# Patient Record
Sex: Female | Born: 1980 | Race: White | Hispanic: No | Marital: Married | Smoking: Former smoker
Health system: Southern US, Community
[De-identification: ages and names within clinical notes are randomized; demographics above are authoritative.]

## PROBLEM LIST (undated history)

## (undated) ENCOUNTER — Inpatient Hospital Stay (HOSPITAL_COMMUNITY): Payer: Self-pay

## (undated) DIAGNOSIS — N390 Urinary tract infection, site not specified: Secondary | ICD-10-CM

## (undated) DIAGNOSIS — C189 Malignant neoplasm of colon, unspecified: Secondary | ICD-10-CM

## (undated) DIAGNOSIS — E039 Hypothyroidism, unspecified: Secondary | ICD-10-CM

## (undated) DIAGNOSIS — Z9889 Other specified postprocedural states: Secondary | ICD-10-CM

## (undated) DIAGNOSIS — O09299 Supervision of pregnancy with other poor reproductive or obstetric history, unspecified trimester: Secondary | ICD-10-CM

## (undated) DIAGNOSIS — Z87442 Personal history of urinary calculi: Secondary | ICD-10-CM

## (undated) DIAGNOSIS — N883 Incompetence of cervix uteri: Secondary | ICD-10-CM

## (undated) DIAGNOSIS — F419 Anxiety disorder, unspecified: Secondary | ICD-10-CM

## (undated) DIAGNOSIS — E079 Disorder of thyroid, unspecified: Secondary | ICD-10-CM

## (undated) DIAGNOSIS — R112 Nausea with vomiting, unspecified: Secondary | ICD-10-CM

## (undated) DIAGNOSIS — N200 Calculus of kidney: Secondary | ICD-10-CM

## (undated) DIAGNOSIS — J302 Other seasonal allergic rhinitis: Secondary | ICD-10-CM

## (undated) DIAGNOSIS — O09899 Supervision of other high risk pregnancies, unspecified trimester: Secondary | ICD-10-CM

## (undated) HISTORY — DX: Other specified postprocedural states: Z98.890

## (undated) HISTORY — DX: Supervision of other high risk pregnancies, unspecified trimester: O09.899

## (undated) HISTORY — DX: Disorder of thyroid, unspecified: E07.9

## (undated) HISTORY — PX: CHOLECYSTECTOMY: SHX55

## (undated) HISTORY — DX: Hypothyroidism, unspecified: E03.9

## (undated) HISTORY — DX: Malignant neoplasm of colon, unspecified: C18.9

## (undated) HISTORY — DX: Supervision of pregnancy with other poor reproductive or obstetric history, unspecified trimester: O09.299

## (undated) HISTORY — DX: Incompetence of cervix uteri: N88.3

---

## 2001-01-12 ENCOUNTER — Other Ambulatory Visit: Admission: RE | Admit: 2001-01-12 | Discharge: 2001-01-12 | Payer: Self-pay | Admitting: Specialist

## 2008-01-31 ENCOUNTER — Other Ambulatory Visit: Admission: RE | Admit: 2008-01-31 | Discharge: 2008-01-31 | Payer: Self-pay | Admitting: Obstetrics and Gynecology

## 2008-03-29 ENCOUNTER — Ambulatory Visit (HOSPITAL_COMMUNITY): Admission: RE | Admit: 2008-03-29 | Discharge: 2008-03-29 | Payer: Self-pay | Admitting: Specialist

## 2009-02-19 ENCOUNTER — Other Ambulatory Visit: Admission: RE | Admit: 2009-02-19 | Discharge: 2009-02-19 | Payer: Self-pay | Admitting: Obstetrics & Gynecology

## 2010-04-07 ENCOUNTER — Other Ambulatory Visit: Admission: RE | Admit: 2010-04-07 | Discharge: 2010-04-07 | Payer: Self-pay | Admitting: Obstetrics & Gynecology

## 2011-04-13 ENCOUNTER — Other Ambulatory Visit (HOSPITAL_COMMUNITY)
Admission: RE | Admit: 2011-04-13 | Discharge: 2011-04-13 | Disposition: A | Discharge status: Home / Self Care | Payer: BC Managed Care – PPO | Source: Ambulatory Visit | Attending: Obstetrics & Gynecology | Admitting: Obstetrics & Gynecology

## 2011-04-13 DIAGNOSIS — Z01419 Encounter for gynecological examination (general) (routine) without abnormal findings: Secondary | ICD-10-CM | POA: Insufficient documentation

## 2011-04-21 ENCOUNTER — Other Ambulatory Visit: Payer: Self-pay

## 2011-06-29 LAB — OB RESULTS CONSOLE ABO/RH: RH Type: POSITIVE

## 2011-06-29 LAB — OB RESULTS CONSOLE ANTIBODY SCREEN: Antibody Screen: NEGATIVE

## 2011-06-29 LAB — OB RESULTS CONSOLE HEPATITIS B SURFACE ANTIGEN: Hepatitis B Surface Ag: NEGATIVE

## 2011-06-29 LAB — OB RESULTS CONSOLE RUBELLA ANTIBODY, IGM: Rubella: IMMUNE

## 2011-09-30 ENCOUNTER — Encounter (HOSPITAL_COMMUNITY): Payer: Self-pay | Admitting: Anesthesiology

## 2011-09-30 ENCOUNTER — Encounter (HOSPITAL_COMMUNITY)
Admission: RE | Disposition: A | Discharge status: Home / Self Care | Payer: Self-pay | Source: Ambulatory Visit | Attending: Obstetrics and Gynecology

## 2011-09-30 ENCOUNTER — Inpatient Hospital Stay (HOSPITAL_COMMUNITY): Payer: BC Managed Care – PPO | Admitting: Anesthesiology

## 2011-09-30 ENCOUNTER — Encounter (HOSPITAL_COMMUNITY): Payer: Self-pay | Admitting: *Deleted

## 2011-09-30 ENCOUNTER — Other Ambulatory Visit: Payer: Self-pay | Admitting: Obstetrics and Gynecology

## 2011-09-30 ENCOUNTER — Ambulatory Visit (HOSPITAL_COMMUNITY)
Admission: RE | Admit: 2011-09-30 | Discharge: 2011-10-01 | Disposition: A | Discharge status: Home / Self Care | Payer: BC Managed Care – PPO | Source: Ambulatory Visit | Attending: Obstetrics and Gynecology | Admitting: Obstetrics and Gynecology

## 2011-09-30 DIAGNOSIS — O3430 Maternal care for cervical incompetence, unspecified trimester: Principal | ICD-10-CM | POA: Insufficient documentation

## 2011-09-30 DIAGNOSIS — O09293 Supervision of pregnancy with other poor reproductive or obstetric history, third trimester: Secondary | ICD-10-CM | POA: Diagnosis present

## 2011-09-30 DIAGNOSIS — Z01812 Encounter for preprocedural laboratory examination: Secondary | ICD-10-CM | POA: Insufficient documentation

## 2011-09-30 HISTORY — DX: Hypothyroidism, unspecified: E03.9

## 2011-09-30 HISTORY — PX: CERVICAL CERCLAGE: SHX1329

## 2011-09-30 HISTORY — DX: Other seasonal allergic rhinitis: J30.2

## 2011-09-30 LAB — CBC
HCT: 37.2 % (ref 36.0–46.0)
MCHC: 33.9 g/dL (ref 30.0–36.0)
MCV: 90.3 fL (ref 78.0–100.0)
Platelets: 284 10*3/uL (ref 150–400)
RDW: 13.6 % (ref 11.5–15.5)
WBC: 14.5 10*3/uL — ABNORMAL HIGH (ref 4.0–10.5)

## 2011-09-30 LAB — URINALYSIS, ROUTINE W REFLEX MICROSCOPIC
Ketones, ur: NEGATIVE mg/dL
Leukocytes, UA: NEGATIVE
Nitrite: NEGATIVE
Specific Gravity, Urine: 1.015 (ref 1.005–1.030)
Urobilinogen, UA: 0.2 mg/dL (ref 0.0–1.0)
pH: 6.5 (ref 5.0–8.0)

## 2011-09-30 LAB — BASIC METABOLIC PANEL
BUN: 7 mg/dL (ref 6–23)
Chloride: 103 mEq/L (ref 96–112)
Creatinine, Ser: 0.48 mg/dL — ABNORMAL LOW (ref 0.50–1.10)
GFR calc Af Amer: >90 mL/min (ref >90)
GFR calc non Af Amer: >90 mL/min (ref >90)

## 2011-09-30 LAB — SURGICAL PCR SCREEN: Staphylococcus aureus: NEGATIVE

## 2011-09-30 SURGERY — CERCLAGE, CERVIX, VAGINAL APPROACH
Anesthesia: Spinal | Site: Cervix | Wound class: Clean Contaminated

## 2011-09-30 MED ORDER — OXYCODONE-ACETAMINOPHEN 5-325 MG PO TABS
1.0000 | ORAL_TABLET | ORAL | Status: DC | PRN
  Administered 2011-09-30: 1 via ORAL
  Filled 2011-09-30: qty 1

## 2011-09-30 MED ORDER — DEXTROSE 5 % IV SOLN
500.0000 mg | INTRAVENOUS | Status: DC
  Administered 2011-10-01: 500 mg via INTRAVENOUS
  Filled 2011-09-30: qty 500

## 2011-09-30 MED ORDER — BUTORPHANOL TARTRATE 1 MG/ML IJ SOLN: 1.0000 mg | INTRAMUSCULAR | Status: DC | PRN

## 2011-09-30 MED ORDER — FENTANYL CITRATE 0.05 MG/ML IJ SOLN
INTRAMUSCULAR | Status: DC | PRN
  Administered 2011-09-30: 25 ug via INTRATHECAL

## 2011-09-30 MED ORDER — MUPIROCIN 2 % EX OINT
TOPICAL_OINTMENT | Freq: Two times a day (BID) | CUTANEOUS | Status: DC
  Administered 2011-09-30: 1 via NASAL

## 2011-09-30 MED ORDER — CEFAZOLIN SODIUM 1-5 GM-% IV SOLN
INTRAVENOUS | Status: AC
  Filled 2011-09-30: qty 50

## 2011-09-30 MED ORDER — DOCUSATE SODIUM 100 MG PO CAPS
100.0000 mg | ORAL_CAPSULE | Freq: Two times a day (BID) | ORAL | Status: DC
  Administered 2011-09-30: 100 mg via ORAL
  Filled 2011-09-30 (×2): qty 1

## 2011-09-30 MED ORDER — 0.9 % SODIUM CHLORIDE (POUR BTL) OPTIME
TOPICAL | Status: DC | PRN
  Administered 2011-09-30: 1000 mL

## 2011-09-30 MED ORDER — FENTANYL CITRATE 0.05 MG/ML IJ SOLN: 25.0000 ug | INTRAMUSCULAR | Status: DC | PRN

## 2011-09-30 MED ORDER — MUPIROCIN 2 % EX OINT
TOPICAL_OINTMENT | CUTANEOUS | Status: AC
  Administered 2011-09-30: 1 via NASAL
  Filled 2011-09-30: qty 22

## 2011-09-30 MED ORDER — LIDOCAINE IN DEXTROSE 5-7.5 % IV SOLN
INTRAVENOUS | Status: AC
  Filled 2011-09-30: qty 2

## 2011-09-30 MED ORDER — FENTANYL CITRATE 0.05 MG/ML IJ SOLN
INTRAMUSCULAR | Status: AC
  Filled 2011-09-30: qty 2

## 2011-09-30 MED ORDER — ONDANSETRON HCL 4 MG/2ML IJ SOLN: 4.0000 mg | Freq: Once | INTRAMUSCULAR | Status: DC | PRN

## 2011-09-30 MED ORDER — TERBUTALINE SULFATE 1 MG/ML IJ SOLN
INTRAMUSCULAR | Status: DC | PRN
  Administered 2011-09-30: 250 ug via SUBCUTANEOUS

## 2011-09-30 MED ORDER — MIDAZOLAM HCL 2 MG/2ML IJ SOLN
1.0000 mg | INTRAMUSCULAR | Status: DC | PRN
  Administered 2011-09-30 (×2): 1 mg via INTRAVENOUS

## 2011-09-30 MED ORDER — ONDANSETRON HCL 4 MG/2ML IJ SOLN
INTRAMUSCULAR | Status: AC
  Filled 2011-09-30: qty 2

## 2011-09-30 MED ORDER — ZOLPIDEM TARTRATE 5 MG PO TABS: 5.0000 mg | ORAL_TABLET | Freq: Every evening | ORAL | Status: DC | PRN

## 2011-09-30 MED ORDER — NIFEDIPINE 10 MG PO CAPS
10.0000 mg | ORAL_CAPSULE | Freq: Three times a day (TID) | ORAL | Status: DC
  Administered 2011-09-30 (×2): 10 mg via ORAL
  Filled 2011-09-30 (×7): qty 1

## 2011-09-30 MED ORDER — ACETAMINOPHEN 325 MG PO TABS: 650.0000 mg | ORAL_TABLET | ORAL | Status: DC | PRN

## 2011-09-30 MED ORDER — LACTATED RINGERS IV SOLN
INTRAVENOUS | Status: DC
  Administered 2011-09-30: 1000 mL via INTRAVENOUS

## 2011-09-30 MED ORDER — SODIUM CHLORIDE 0.9 % IV SOLN
INTRAVENOUS | Status: AC
  Administered 2011-09-30: 16:00:00 via INTRAVENOUS

## 2011-09-30 MED ORDER — EPHEDRINE SULFATE 50 MG/ML IJ SOLN
INTRAMUSCULAR | Status: DC | PRN
  Administered 2011-09-30: 10 mg via INTRAVENOUS

## 2011-09-30 MED ORDER — PRENATAL MULTIVITAMIN CH
1.0000 | ORAL_TABLET | Freq: Every day | ORAL | Status: DC
  Administered 2011-09-30: 1 via ORAL
  Filled 2011-09-30 (×3): qty 1

## 2011-09-30 MED ORDER — LIDOCAINE IN DEXTROSE 5-7.5 % IV SOLN
INTRAVENOUS | Status: DC | PRN
  Administered 2011-09-30: 70 mg via INTRATHECAL

## 2011-09-30 MED ORDER — ONDANSETRON HCL 4 MG PO TABS: 4.0000 mg | ORAL_TABLET | Freq: Four times a day (QID) | ORAL | Status: DC | PRN

## 2011-09-30 MED ORDER — DEXTROSE 5 % IV SOLN: 1.0000 g | INTRAVENOUS | Status: DC

## 2011-09-30 MED ORDER — TERBUTALINE SULFATE 1 MG/ML IJ SOLN
INTRAMUSCULAR | Status: AC
  Filled 2011-09-30: qty 1

## 2011-09-30 MED ORDER — MIDAZOLAM HCL 2 MG/2ML IJ SOLN
INTRAMUSCULAR | Status: AC
  Administered 2011-09-30: 1 mg via INTRAVENOUS
  Filled 2011-09-30: qty 2

## 2011-09-30 MED ORDER — LEVOTHYROXINE SODIUM 50 MCG PO TABS
50.0000 ug | ORAL_TABLET | Freq: Every day | ORAL | Status: DC
  Administered 2011-10-01: 50 ug via ORAL
  Filled 2011-09-30 (×2): qty 1

## 2011-09-30 MED ORDER — ONDANSETRON HCL 4 MG/2ML IJ SOLN: 4.0000 mg | Freq: Four times a day (QID) | INTRAMUSCULAR | Status: DC | PRN

## 2011-09-30 MED ORDER — SIMETHICONE 80 MG PO CHEW: 80.0000 mg | CHEWABLE_TABLET | Freq: Four times a day (QID) | ORAL | Status: DC | PRN

## 2011-09-30 SURGICAL SUPPLY — 19 items
BAG HAMPER (MISCELLANEOUS) ×2 IMPLANT
CATH ROBINSON RED A/P 16FR (CATHETERS) IMPLANT
CLOTH BEACON ORANGE TIMEOUT ST (SAFETY) ×2 IMPLANT
COVER SURGICAL LIGHT HANDLE (MISCELLANEOUS) ×4 IMPLANT
DRAPE PROXIMA HALF (DRAPES) ×2 IMPLANT
GAUZE SPONGE 4X4 16PLY XRAY LF (GAUZE/BANDAGES/DRESSINGS) ×1 IMPLANT
GLOVE ECLIPSE 6.5 STRL STRAW (GLOVE) ×3 IMPLANT
GLOVE ECLIPSE 9.0 STRL (GLOVE) ×3 IMPLANT
GLOVE INDICATOR 6.5 STRL GRN (GLOVE) ×1 IMPLANT
GLOVE INDICATOR 7.0 STRL GRN (GLOVE) ×3 IMPLANT
GLOVE INDICATOR STER SZ 9 (GLOVE) ×2 IMPLANT
GOWN STRL REIN 3XL LVL4 (GOWN DISPOSABLE) ×2 IMPLANT
GOWN STRL REIN XL XLG (GOWN DISPOSABLE) ×3 IMPLANT
KIT ROOM TURNOVER AP CYSTO (KITS) ×2 IMPLANT
MANIFOLD NEPTUNE II (INSTRUMENTS) ×2 IMPLANT
NS IRRIG 1000ML POUR BTL (IV SOLUTION) ×2 IMPLANT
PACK PERI GYN (CUSTOM PROCEDURE TRAY) ×2 IMPLANT
PAD ARMBOARD 7.5X6 YLW CONV (MISCELLANEOUS) ×2 IMPLANT
SUT PROLENE 0 CT 1 30 (SUTURE) ×4 IMPLANT

## 2011-09-30 NOTE — Anesthesia Preprocedure Evaluation (Addendum)
Anesthesia Evaluation  Patient identified by MRN, date of birth, ID band Patient awake    Reviewed: Allergy & Precautions, H&P , NPO status , Patient's Chart, lab work & pertinent test results  History of Anesthesia Complications Negative for: history of anesthetic complications  Airway Mallampati: I TM Distance: >3 FB     Dental  (+) Teeth Intact   Pulmonary neg pulmonary ROS, Current Smoker,  breath sounds clear to auscultation        Cardiovascular negative cardio ROS  Rhythm:Regular     Neuro/Psych    GI/Hepatic   Endo/Other  Hypothyroidism   Renal/GU      Musculoskeletal   Abdominal   Peds  Hematology   Anesthesia Other Findings   Reproductive/Obstetrics                           Anesthesia Physical Anesthesia Plan  ASA: II  Anesthesia Plan: Spinal   Post-op Pain Management:    Induction:   Airway Management Planned: Nasal Cannula  Additional Equipment:   Intra-op Plan:   Post-operative Plan:   Informed Consent: I have reviewed the patients History and Physical, chart, labs and discussed the procedure including the risks, benefits and alternatives for the proposed anesthesia with the patient or authorized representative who has indicated his/her understanding and acceptance.     Plan Discussed with:   Anesthesia Plan Comments:         Anesthesia Quick Evaluation

## 2011-09-30 NOTE — Anesthesia Postprocedure Evaluation (Signed)
  Anesthesia Post-op Note  Patient: Kimberly Morgan  Procedure(s) Performed: Procedure(s) (LRB): CERCLAGE CERVICAL (N/A)  Patient Location: PACU  Anesthesia Type: Spinal  Level of Consciousness: awake, alert , oriented and patient cooperative  Airway and Oxygen Therapy: Patient Spontanous Breathing  Post-op Pain: none  Post-op Assessment: Post-op Vital signs reviewed, Patient's Cardiovascular Status Stable, Respiratory Function Stable, Patent Airway, No signs of Nausea or vomiting and Pain level controlled  Post-op Vital Signs: Reviewed and stable  Complications: No apparent anesthesia complications

## 2011-09-30 NOTE — Brief Op Note (Signed)
09/30/2011  2:03 PM  PATIENT:  Kimberly Morgan  31 y.o. female  PRE-OPERATIVE DIAGNOSIS:  Cervical incompetency, pregnancy 19+4 weeks  POST-OPERATIVE DIAGNOSIS:  Cervical incompetency, pregnancy 19+4 weeks  PROCEDURE:  Procedure(s) (LRB): CERCLAGE CERVICAL (N/A)  SURGEON:  Surgeon(s) and Role:    * [name], MD - Primary  PHYSICIAN ASSISTANT:   ASSISTANTS: Orson Aloe CST-FA   ANESTHESIA:   spinal  EBL:  Total I/O In: 700 [I.V.:700] Out: -   BLOOD ADMINISTERED:none  DRAINS: none   LOCAL MEDICATIONS USED:  NONE  SPECIMEN:  No Specimen  DISPOSITION OF SPECIMEN:  N/A  COUNTS:  YES  TOURNIQUET:  * No tourniquets in log *  DICTATION: .Dragon Dictation Patient was taken to the operating room spinal anesthesia introduced and patient placed in high lithotomy position. Timeout was conducted and perineal prepping performed. Ancef and an administered preoperatively. Bivalve speculum was placed in the vagina allowing visibility of the cervix and the membranes which were visible just inside the external os. Tocolyse is administered so when the patient was placed in the Trendelenburg position the membranes receded to allow easier surgical technique. Ring forceps were used to grasp the cervix along its apprehension approximately 2 cm from the tip of the cervix while under traction, we were able to place a circumferential 0 Prolene pursestring suture just at the cervicovaginal junction. Upon completion of circumferential place a knot was placed time pulling the cerclage sufficiently snug ring forceps met resistance at the level of the ring.KNot was tied down with 5 knots in the ligature.. A second circumferential 0 Prolene suture was placed just above the first and similarly secured there was no significant bleeding and no membrane rupture or or suggestion of fluid leakage. Patient was allowed to go recovery in stable condition sponge and needle counts correct. PLAN OF CARE: Admit  for overnight observation.  PATIENT DISPOSITION:  PACU - hemodynamically stable.   Delay start of Pharmacological VTE agent (>24hrs) due  surgical blood loss or risk of bleeding: not applicable

## 2011-09-30 NOTE — Op Note (Signed)
See details of operative procedure dictated in brief operative note

## 2011-09-30 NOTE — Transfer of Care (Signed)
Immediate Anesthesia Transfer of Care Note  Patient: Kimberly Morgan  Procedure(s) Performed: Procedure(s) (LRB): CERCLAGE CERVICAL (N/A)  Patient Location: PACU  Anesthesia Type: Spinal  Level of Consciousness: awake, alert , oriented and patient cooperative  Airway & Oxygen Therapy: Patient Spontanous Breathing and Patient connected to face mask oxygen  Post-op Assessment: Report given to PACU RN and Post -op Vital signs reviewed and stable  Post vital signs: Reviewed and stable  Complications: No apparent anesthesia complications

## 2011-09-30 NOTE — Progress Notes (Signed)
Fetal heart rate 147

## 2011-09-30 NOTE — Anesthesia Procedure Notes (Addendum)
Spinal  Patient location during procedure: OR Start time: 09/30/2011 1:12 PM Staffing CRNA/Resident: Rozetta Stumpp J Performed by: resident/CRNA  Preanesthetic Checklist Completed: patient identified, site marked, surgical consent, pre-op evaluation, timeout performed, IV checked, risks and benefits discussed and monitors and equipment checked Spinal Block Patient position: sitting Prep: Betadine Patient monitoring: heart rate, cardiac monitor, continuous pulse ox and blood pressure Approach: midline Location: L3-4 Injection technique: single-shot Needle Needle type: Sprotte  Needle gauge: 24 G Assessment Sensory level: L1  [id number]    10/2012

## 2011-09-30 NOTE — H&P (Signed)
Kimberly Morgan is an 31 y.o. female. She is admitted for rescue McDonald cerclage for cervical incompetence.Marland KitchenHe was seen in the office today due to complaints of vaginal discharge that was considered excessive and would like pain discoloration 1-1/2 days. She had some cramping yesterday but denies any cramping today. She's had a gush of fluid last sexual activity was 2 days ago without any post coital bleeding or or unusual symptom. Speculum exam shows cervix dilated to 2-3 cm with bag waters just inside the external os. The patient's been counseled over treatment options with recommendation for  rescue cerclage, with pros and cons discussed with patient. Patient understands this is significantly high-risk circumstance due to the advanced cervical dilation at this time and she accepts the recommended McDonald cerclage. Past medical history positive for hypothyroidism with patient recently restarted on her Synthroid 50 mcg each day. TSH is normal is 21.8. Surgical history laparoscopic cholecystectomy allergies to aspirin and Bactrim education Synthroid 50 mcg daily prenatal vitamins social history married works as hairdresser history positive that the father the baby had non-Hodgkin's lymphoma as a child and underwent chemotherapy and radiation. This pregnancy was a Clomid-induced ovulation. 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 There is also a history of lung cancer  Pertinent Gynecological History: Menses: LMP 05/15/2011 Bleeding: Not applicable  Contraception: Pregnant DES exposure: unknown Blood transfusions: none Sexually transmitted diseases: no past history Previous GYN Procedures: Last mammogram: Blood Date: \ Prior GYN procedures: At age 11 she had a biopsy or other procedure for abnormal Pap smear followed by cryocautery. OB History: G1, P0   Menstrual History: Menarche age: 40 No LMP  recorded.    No past medical history on file.  No past surgical history on file.  No family history on file.  Social History:  does not have a smoking history on file. She does not have any smokeless tobacco history on file. Her alcohol and drug histories not on file.  Allergies: Allergies not on file   (Not in a hospital admission)  Review of Systems  Constitutional: Negative.  Negative for fever and chills.  HENT: Negative.   Gastrointestinal: Negative.   Genitourinary: Negative.  Negative for dysuria and urgency.       Last sex 2 days ago, no bleeding or gush of fluid. Noted heavier than expected discharge x 1 1/2 days,  Lite pink .  No cramping today but some yesterday.  Skin: Negative.     There were no vitals taken for this visit. Physical Exam  Constitutional: She is oriented to person, place, and time. She appears well-developed and well-nourished.  HENT:  Head: Normocephalic.  Eyes: Pupils are equal, round, and reactive to light.  Cardiovascular: Regular rhythm.   Respiratory: Effort normal and breath sounds normal.  GI: Soft. There is no tenderness.  Genitourinary: Vaginal discharge found.       Ultrasound shows funneling thru cervix to ext os. No functional cervical length is closed, but int os identifiable with funnel over 3 cm length.No mucus or debris in bow. Breech presentation normal fetal survey.  Musculoskeletal: Normal range of motion.  Neurological: She is oriented to person, place, and time.  Psychiatric: She has a normal mood and affect. Her behavior is normal. Thought content normal.    No results found for this or any previous visit (from the past 24 hour(s)).  No results found.  Assessment/Plan: Cervical incompetency 19 weeks  4 days at for McDonald cerclage placement. Risk of procedure including membrane rupture, or failure to prevent progression for preterm delivery discussed with patient who understands risks  Kimberly Morgan 09/30/2011,  11:06 AM

## 2011-10-01 LAB — CBC
Platelets: 240 10*3/uL (ref 150–400)
RDW: 14 % (ref 11.5–15.5)
WBC: 14.4 10*3/uL — ABNORMAL HIGH (ref 4.0–10.5)

## 2011-10-01 MED ORDER — DEXTROSE 5 % IV SOLN
INTRAVENOUS | Status: AC
  Filled 2011-10-01: qty 500

## 2011-10-01 MED ORDER — PROGESTERONE MICRONIZED 200 MG PO CAPS: 200.0000 mg | ORAL_CAPSULE | Freq: Every day | ORAL | Status: DC

## 2011-10-01 MED ORDER — AZITHROMYCIN 500 MG PO TABS: 500.0000 mg | ORAL_TABLET | Freq: Once | ORAL | Status: AC

## 2011-10-01 MED ORDER — DSS 100 MG PO CAPS: 100.0000 mg | ORAL_CAPSULE | Freq: Two times a day (BID) | ORAL | Status: AC

## 2011-10-01 NOTE — Discharge Summary (Signed)
Physician Discharge Summary  Patient ID: Kimberly Morgan MRN: [id number] DOB/AGE: 08/10/1980 31 y.o.  Admit date: 09/30/2011 Discharge date: 10/01/2011  Admission Diagnoses:cervical incompetency, pregnancy 19+4 wks   Discharge Diagnoses: same Principal Problem:  *Incompetent cervix in pregnancy   Discharged Condition: good  Hospital Course: Patient admitted, thru day surgery, for rescue cerclage, Mcdonald, using 0 prolene x 2 sutures.  Observed overnite, with minimal cramping, afebrile, with discharge to home care for followup 4 days [facility]  Consults: None  Significant Diagnostic Studies: labs: Lab Results  Component Value Date   WBC 14.4* 10/01/2011   HGB 10.5* 10/01/2011   HCT 31.3* 10/01/2011   MCV 91.8 10/01/2011   PLT 240 10/01/2011    Treatments: surgery: Mcdonald Cerclage, Antibiotics x 4 days,   Discharge Exam: Blood pressure 91/53, pulse 72, temperature 98.4 F (36.9 C), temperature source Oral, resp. rate 18, height 5\' 3"  (1.6 m), weight 88.451 kg (195 lb), last menstrual period 05/15/2011, SpO2 97.00%. General appearance: alert, cooperative and no distress GI: soft, non-tender; bowel sounds normal; no masses,  no organomegaly Pelvic: no bleeding or cramping this morning  Disposition: Final discharge disposition not confirmed Discharge to home care  Medication List  As of 10/01/2011  9:26 AM   TAKE these medications         azithromycin 500 MG tablet   Commonly known as: ZITHROMAX   Take 1 tablet (500 mg total) by mouth once.      DSS 100 MG Caps   Take 100 mg by mouth 2 (two) times daily.      levothyroxine 50 MCG tablet   Commonly known as: SYNTHROID, LEVOTHROID   Take 50 mcg by mouth daily.      prenatal multivitamin Tabs   Take 1 tablet by mouth daily.      progesterone 200 MG capsule   Commonly known as: PROMETRIUM   Take 1 capsule (200 mg total) by mouth daily.           Follow-up Information    Follow up with Lazaro Arms, MD. (or go to  [facility] hospital prn as needed if symptoms worsen)    Contact information:   [facility] [address] [place] [address] [phone]          Signed: [name] 10/01/2011, 9:26 AM

## 2011-10-01 NOTE — Progress Notes (Signed)
Pt denies c/o's no bleeding noted with ambulation FHR between 150-155

## 2011-10-01 NOTE — Anesthesia Postprocedure Evaluation (Signed)
Anesthesia Post Note  Patient: Kimberly Morgan  Procedure(s) Performed: Procedure(s) (LRB): CERCLAGE CERVICAL (N/A)  Anesthesia type: Spinal  Patient location:324  Post pain: Pain level controlled  Post assessment: Post-op Vital signs reviewed, Patient's Cardiovascular Status Stable, Respiratory Function Stable, Patent Airway, No signs of Nausea or vomiting and Pain level controlled  Last Vitals:  Filed Vitals:   10/01/11 0519  BP: 91/53  Pulse: 72  Temp: 36.9 C  Resp: 18    Post vital signs: Reviewed and stable  Level of consciousness: awake and alert   Complications: No apparent anesthesia complications

## 2011-10-01 NOTE — Addendum Note (Signed)
Addendum  created 10/01/11 0800 by Franco Nones, CRNA   Modules edited:Notes Section

## 2011-10-05 ENCOUNTER — Encounter (HOSPITAL_COMMUNITY): Payer: Self-pay | Admitting: Obstetrics and Gynecology

## 2011-10-20 ENCOUNTER — Other Ambulatory Visit: Payer: Self-pay | Admitting: Obstetrics and Gynecology

## 2011-10-20 ENCOUNTER — Encounter (HOSPITAL_COMMUNITY): Payer: Self-pay | Admitting: *Deleted

## 2011-10-20 ENCOUNTER — Inpatient Hospital Stay (HOSPITAL_COMMUNITY)
Admission: AD | Admit: 2011-10-20 | Discharge: 2011-10-22 | DRG: 372 | Disposition: A | Discharge status: Home / Self Care | Payer: BC Managed Care – PPO | Source: Ambulatory Visit | Attending: Obstetrics and Gynecology | Admitting: Obstetrics and Gynecology

## 2011-10-20 DIAGNOSIS — O3430 Maternal care for cervical incompetence, unspecified trimester: Principal | ICD-10-CM | POA: Diagnosis present

## 2011-10-20 DIAGNOSIS — E039 Hypothyroidism, unspecified: Secondary | ICD-10-CM | POA: Diagnosis present

## 2011-10-20 DIAGNOSIS — O411090 Infection of amniotic sac and membranes, unspecified, unspecified trimester, not applicable or unspecified: Secondary | ICD-10-CM | POA: Diagnosis present

## 2011-10-20 DIAGNOSIS — E079 Disorder of thyroid, unspecified: Secondary | ICD-10-CM | POA: Diagnosis present

## 2011-10-20 DIAGNOSIS — O4290 Premature rupture of membranes, unspecified as to length of time between rupture and onset of labor, unspecified weeks of gestation: Secondary | ICD-10-CM | POA: Diagnosis present

## 2011-10-20 DIAGNOSIS — O99284 Endocrine, nutritional and metabolic diseases complicating childbirth: Secondary | ICD-10-CM | POA: Diagnosis present

## 2011-10-20 LAB — URINALYSIS, ROUTINE W REFLEX MICROSCOPIC
Ketones, ur: 15 mg/dL — AB
Nitrite: NEGATIVE
Protein, ur: NEGATIVE mg/dL
Urobilinogen, UA: 0.2 mg/dL (ref 0.0–1.0)
pH: 5.5 (ref 5.0–8.0)

## 2011-10-20 LAB — URINE MICROSCOPIC-ADD ON

## 2011-10-20 LAB — CBC
Hemoglobin: 11.6 g/dL — ABNORMAL LOW (ref 12.0–15.0)
MCHC: 33.6 g/dL (ref 30.0–36.0)
Platelets: 244 10*3/uL (ref 150–400)
RDW: 13.3 % (ref 11.5–15.5)

## 2011-10-20 MED ORDER — ACETAMINOPHEN 325 MG PO TABS
650.0000 mg | ORAL_TABLET | ORAL | Status: DC | PRN
  Administered 2011-10-21: 650 mg via ORAL
  Filled 2011-10-20: qty 2

## 2011-10-20 MED ORDER — LEVOTHYROXINE SODIUM 50 MCG PO TABS
50.0000 ug | ORAL_TABLET | Freq: Every day | ORAL | Status: DC
  Administered 2011-10-21: 50 ug via ORAL
  Filled 2011-10-20 (×2): qty 1

## 2011-10-20 MED ORDER — ZOLPIDEM TARTRATE 10 MG PO TABS
10.0000 mg | ORAL_TABLET | Freq: Every evening | ORAL | Status: DC | PRN
  Administered 2011-10-21: 10 mg via ORAL
  Filled 2011-10-20: qty 1

## 2011-10-20 MED ORDER — PRENATAL MULTIVITAMIN CH
1.0000 | ORAL_TABLET | Freq: Every day | ORAL | Status: DC
  Administered 2011-10-21: 1 via ORAL
  Filled 2011-10-20 (×2): qty 1

## 2011-10-20 MED ORDER — CALCIUM CARBONATE ANTACID 500 MG PO CHEW: 2.0000 | CHEWABLE_TABLET | ORAL | Status: DC | PRN

## 2011-10-20 MED ORDER — DOCUSATE SODIUM 100 MG PO CAPS
100.0000 mg | ORAL_CAPSULE | Freq: Two times a day (BID) | ORAL | Status: DC
  Administered 2011-10-20 – 2011-10-21 (×2): 100 mg via ORAL
  Filled 2011-10-20 (×2): qty 1

## 2011-10-20 NOTE — H&P (Signed)
Kimberly Morgan is a 31 y.o. G1P0 at 22 weeks 3 days based on [facility] of 02/19/12 admitted for cervical incompetence.  She had the incidental discovery of an incompetent cervix at exam 09/30/11, and underwent Rescue 2-stitch McDonald cerclage with 0-Prolene.  Exam 2 days later in office showed a closed cervix to visual inspection.  Repeat ultrasound in office 5/13 showed the cervix to be funneling again, but closed at the suture visible by ultrasound.  Patient remained assymptomatic til today's scheduled visit, when pink discharge noted upon arrival for visit.  Speculum check shows the cervix dilated visually to 4cm/100. She denies SROM or contractions.  Fetal presentation is breech. Female infant with normal anatomic survey at [redacted] wks  Gestational Criteria: LMP: 05/15/2011-------------------[facility] 9/27 U/S:    [redacted]w[redacted]d on 2/4(gest sac)    EDC 10/2 U/S:     [redacted]w[redacted]d on 2/18   --------     [facility] 9/28 U/S:    [redacted]w[redacted]d on 3/18   -------      [facility] 9/28     Cervix long, closed U/S;     19+ on  5/8   ----------       [facility] 9/27     Cervix open, by u/s 2.54 cm dilated, no functional length by u/s, with a 3 cm long "funnel"  History of Present Illness: 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  Patient reports the fetal movement as active. Patient reports uterine contraction  activity as none. Patient reports  vaginal bleeding as light pink discharge first noted today. Patient describes fluid per vagina as None.  Patient Active Problem List  Diagnoses  . Incompetent cervix in pregnancy   Past Medical History: Past Medical History  Diagnosis Date  . Hypothyroidism   . Seasonal allergies     Past Surgical History: Past Surgical History  Procedure Date  . Cholecystectomy     laparoscopic- 2011  . Cervical cerclage 09/30/2011    Procedure: CERCLAGE CERVICAL;  Surgeon: [name], MD;  Location: [facility];  Service: Gynecology;  Laterality: N/A;    Obstetrical History: OB History    Grav Para Term Preterm  Abortions TAB SAB Ect Mult Living   1               Gynecological History: positive for - infertility x 9 yrs  Social History: History   Social History  . Marital Status: Married    Spouse Name: N/A    Number of Children: N/A  . Years of Education: N/A   Social History Main Topics  . Smoking status: Current Some Day Smoker -- 0.2 packs/day for 10 years    Types: Cigarettes  . Smokeless tobacco: Not on file  . Alcohol Use: No  . Drug Use: No  . Sexually Active: Yes   Other Topics Concern  . Not on file   Social History Narrative  . No narrative on file    Family History: Family History  Problem Relation Age of Onset  . Coronary artery disease Mother     Allergies: See med hx   (Not in a hospital admission)  Review of Systems - Genito-Urinary ROS: no dysuria, trouble voiding, or hematuria  Vitals:  Last menstrual period 05/15/2011. Physical Examination:  General appearance - alert, well appearing, and in no distress and anxious Eyes - pupils equal and reactive, extraocular eye movements intact Ears -  Chest - clear to auscultation, no wheezes, rales or rhonchi, symmetric air entry Heart - normal rate and  regular rhythm Abdomen - gravid uterus , nontender, consistent with dates +FHT . Infant breech Pelvic - VULVA: normal appearing vulva with no masses, tenderness or lesions, Speculum shows pink mucus with 4 cm BOW visible at ext os. One stitch visible anterior to BOW Extremities - peripheral pulses normal, no pedal edema, no clubbing or cyanosis, Homan's sign negative bilaterally Abdomen: gravid and fundal height  is size equals dates .Fetal Monitoring:to be done on admit   Labs:  No results found for this or any previous visit (from the past 24 hour(s)).  Imaging Studies: No results found.     I have reviewed the patient's current medications.   ASSESSMENT: Patient Active Problem List  Diagnoses  . Incompetent cervix in pregnancy    Plan:   Bedrest supine, External monitoring for contractions. Will use tocolysis if contractions noted.  BMZ at 23+5 wks.              MFM consult.

## 2011-10-21 ENCOUNTER — Encounter (HOSPITAL_COMMUNITY): Payer: Self-pay | Admitting: *Deleted

## 2011-10-21 ENCOUNTER — Inpatient Hospital Stay (HOSPITAL_COMMUNITY): Payer: BC Managed Care – PPO

## 2011-10-21 DIAGNOSIS — O3430 Maternal care for cervical incompetence, unspecified trimester: Secondary | ICD-10-CM

## 2011-10-21 DIAGNOSIS — O411090 Infection of amniotic sac and membranes, unspecified, unspecified trimester, not applicable or unspecified: Secondary | ICD-10-CM

## 2011-10-21 DIAGNOSIS — O99284 Endocrine, nutritional and metabolic diseases complicating childbirth: Secondary | ICD-10-CM

## 2011-10-21 DIAGNOSIS — E079 Disorder of thyroid, unspecified: Secondary | ICD-10-CM

## 2011-10-21 LAB — TSH: TSH: 1.449 u[IU]/mL (ref 0.350–4.500)

## 2011-10-21 LAB — AMNISURE RUPTURE OF MEMBRANE (ROM) NOT AT ARMC: Amnisure ROM: POSITIVE

## 2011-10-21 MED ORDER — DEXTROSE 5 % IV SOLN
500.0000 mg | INTRAVENOUS | Status: DC
  Administered 2011-10-21: 500 mg via INTRAVENOUS
  Filled 2011-10-21: qty 500

## 2011-10-21 MED ORDER — NIFEDIPINE ER 30 MG PO TB24
30.0000 mg | ORAL_TABLET | Freq: Two times a day (BID) | ORAL | Status: DC
  Administered 2011-10-21 (×2): 30 mg via ORAL
  Filled 2011-10-21 (×3): qty 1

## 2011-10-21 MED ORDER — AMPICILLIN SODIUM 2 G IJ SOLR
2.0000 g | Freq: Four times a day (QID) | INTRAMUSCULAR | Status: DC
  Administered 2011-10-21: 2 g via INTRAVENOUS
  Filled 2011-10-21 (×3): qty 2000

## 2011-10-21 MED ORDER — AMOXICILLIN 500 MG PO CAPS: 500.0000 mg | ORAL_CAPSULE | Freq: Three times a day (TID) | ORAL | Status: DC

## 2011-10-21 MED ORDER — LACTATED RINGERS IV SOLN
INTRAVENOUS | Status: DC
  Administered 2011-10-21: 22:00:00 via INTRAVENOUS

## 2011-10-21 MED ORDER — AZITHROMYCIN 500 MG PO TABS: 500.0000 mg | ORAL_TABLET | Freq: Every day | ORAL | Status: DC

## 2011-10-21 NOTE — Progress Notes (Signed)
Called to pt room by spouse, c/o severe lower abd pain that was sudden, also states having cramping and feeling questionable leaking of fluids from the vagina. Emotional support given and FP notified of pt c/o.

## 2011-10-21 NOTE — Plan of Care (Signed)
Pt transported to room 174 for continued care.  Pt teary and emotional as well as spouse, emotional support given.

## 2011-10-21 NOTE — Progress Notes (Signed)
MFM note  Kimberly Morgan is a 31 yo G1P0 currently at 86 5/7 weeks by LMP (confirmed by early ultrasound) with advanced cervical dilation.  She underwent rescue McDonald cerclage on 09/30/2011 and initially did well.  She was admitted yesterday when she was noted to cervical dilation to 4 cm by visual/ speculum exam.  The patient denies leakage of fluid - had some bleeding yesterday that has since resolved.  She denies fevers/ chills or abdominal pain.  PMH - Hypothyroidism  PSH- Laparascopic chole, Cerclage  Meds - Synthroid 50 mg qD, PNV, Procardia 30 mg XL, Progesterone supplements  Social: ETOH- neg Tob- neg  Family hx: Mother with clubbed foot; ischemic heart disease  Ultrasound:  Single Iup at 22 5/7 weeks confirmed by early ultrasound Normal anatomic fetal survey Normal amniotic fluid volume Fetal membranes noted below the cerclage stitch by transabdominal probe.  Labs: WBC count 17 otherwise normal  Impression/ Plan: 1) Incompetent cervix, advanced cervical dilation - currently stable on bedrest.  We had a brief discussion regarding management at the threshold of viability.  Would recommend NICU consult and counseling.  Based on our discussion, the couple would like everything done for the baby once viability reached - if after counseling they elect for full intervention at 23 weeks, would administer betamethasone at that time and magnesium sulfate for neuroprotection if delivery becomes imminent.  If the baby were to delivery prior to that time, would have Neonatology standby at delivery and assess at time of delivery.  Patient noted to have elevated WBC count - but no clinical evidence of chorioamnionitis - would move toward delivery for fevers or clinical chorioamnionitis.  The benefit of broad spectrum antibiotics currently is of uncertain benefit, but would recommend Ampicllin/ Erythromycin or Ampicillin/ Azithromycin for latency antibiotics if PROM were to occur.    Would  recommend continued bedrest - would add pneumatic compression hose vs. Lovinox for DVT prophylaxis.   Would also consider switching Progesterone suppositories to 17-P injections to avoid inadvertent rupture of membranes.  Thank you for this consult.  Please do not hesitate to call our office if we can be of further assistance.  I spent approximately 30 minutes with this patient with over 50% of time spent in face-to-face counseling.  Alpha Gula, MD

## 2011-10-21 NOTE — Progress Notes (Signed)
Ur chart review completed.  

## 2011-10-21 NOTE — Progress Notes (Signed)
Dietary (management - per patient's spouse request)  to the bedside to discuss patient's concern - duck egg allergy. When patient's spouse calls to place an order for his wife, the system will not allow dietary to order any food that contains eggs - regardless of origin.

## 2011-10-21 NOTE — Progress Notes (Signed)
Called at 20:00 to see pt due to increasing pressure acutely, which gradually resolved. Pt noted some increase in perineal moisture today, but has had some increase x several days.  Speculum exam shows generous clear-to- milky fluid, checked by FFN which returns Positive for membrane rupture.  INcreasing discomfort; I suspect beginning to labor  Will Transfer to L& D for suspected labor related to Chorioamnionitis. Will begin I.V. Antibiotics , Amp 2q6, Azithromycin,  Will have Neonatology consult If discomfort progresses, will speculum check to remove cerclage

## 2011-10-21 NOTE — Progress Notes (Signed)
Patient ID: Kimberly Morgan, female   DOB: 10-May-1981, 31 y.o.   MRN: [id number] FACULTY PRACTICE ANTEPARTUM(COMPREHENSIVE) NOTE  Kimberly Morgan is a 31 y.o. G1P0 at [redacted]w[redacted]d  who is admitted for incompetent cervix s/p cerclage placement.   Fetal presentation is unsure. Length of Stay:  1  Days  Subjective: Patient reports feeling well and reports feeling cramping pain early this morning around 3am. Patient reports the fetal movement as active. Patient reports uterine contraction  activity as irregular cramping. Patient reports  vaginal bleeding as none. Patient describes fluid per vagina as None.  Vitals:  Blood pressure 110/61, pulse 70, temperature 98.4 F (36.9 C), temperature source Oral, resp. rate 18, height 5\' 3"  (1.6 m), weight 88.451 kg (195 lb), last menstrual period 05/15/2011. Physical Examination:  General appearance - alert, well appearing, and in no distress Fundal Height:  size equals dates Pelvic Exam:  examination not indicated Cervical Exam: Not evaluated. Extremities: no edema, redness or tenderness in the calves or thighs with DTRs 2+ bilaterally Membranes:intact  Fetal Monitoring:  positive FH Toco: irregular contractions late evening of 5/28, no contractions this morning  Labs:  Recent Results (from the past 24 hour(s))  CBC   Collection Time   10/20/11  7:30 PM      Component Value Range   WBC 17.3 (*) 4.0 - 10.5 (K/uL)   RBC 3.82 (*) 3.87 - 5.11 (MIL/uL)   Hemoglobin 11.6 (*) 12.0 - 15.0 (g/dL)   HCT 82.9 (*) 56.2 - 46.0 (%)   MCV 90.3  78.0 - 100.0 (fL)   MCH 30.4  26.0 - 34.0 (pg)   MCHC 33.6  30.0 - 36.0 (g/dL)   RDW 13.0  86.5 - 78.4 (%)   Platelets 244  150 - 400 (K/uL)  TSH   Collection Time   10/20/11  7:30 PM      Component Value Range   TSH 1.449  0.350 - 4.500 (uIU/mL)  URINALYSIS, ROUTINE W REFLEX MICROSCOPIC   Collection Time   10/20/11  8:15 PM      Component Value Range   Color, Urine YELLOW  YELLOW    APPearance HAZY (*) CLEAR     Specific Gravity, Urine >1.030 (*) 1.005 - 1.030    pH 5.5  5.0 - 8.0    Glucose, UA NEGATIVE  NEGATIVE (mg/dL)   Hgb urine dipstick LARGE (*) NEGATIVE    Bilirubin Urine NEGATIVE  NEGATIVE    Ketones, ur 15 (*) NEGATIVE (mg/dL)   Protein, ur NEGATIVE  NEGATIVE (mg/dL)   Urobilinogen, UA 0.2  0.0 - 1.0 (mg/dL)   Nitrite NEGATIVE  NEGATIVE    Leukocytes, UA MODERATE (*) NEGATIVE   URINE MICROSCOPIC-ADD ON   Collection Time   10/20/11  8:15 PM      Component Value Range   Squamous Epithelial / LPF FEW (*) RARE    WBC, UA 3-6  <3 (WBC/hpf)   RBC / HPF 11-20  <3 (RBC/hpf)   Urine-Other MUCOUS PRESENT    GLUCOSE, CAPILLARY   Collection Time   10/21/11  6:36 AM      Component Value Range   Glucose-Capillary 93  70 - 99 (mg/dL)    Imaging Studies:    Currently EPIC will not allow sonographic studies to automatically populate into notes.  In the meantime, copy and paste results into note or free text.  Medications:  Scheduled    . docusate sodium  100 mg Oral BID  . levothyroxine  50  mcg Oral QAC breakfast  . prenatal multivitamin  1 tablet Oral Daily   I have reviewed the patient's current medications.  ASSESSMENT: Patient Active Problem List  Diagnoses  . Incompetent cervix in pregnancy    PLAN: 31 yo G1P0 at [redacted]w[redacted]d with incompetent cervix s/p rescue cerclage now with advance cervical dilatation  - Continue bedrest - MFM consult today - Due to patient's reports of cramping pain, will start tocolysis with procardia - continue current antenatal care  Tyller Bowlby 10/21/2011,6:54 AM

## 2011-10-22 ENCOUNTER — Encounter (HOSPITAL_COMMUNITY): Payer: Self-pay | Admitting: *Deleted

## 2011-10-22 MED ORDER — TERBUTALINE SULFATE 1 MG/ML IJ SOLN: 0.2500 mg | Freq: Once | INTRAMUSCULAR | Status: DC | PRN

## 2011-10-22 MED ORDER — OXYTOCIN 20 UNITS IN LACTATED RINGERS INFUSION - SIMPLE
INTRAVENOUS | Status: AC
  Filled 2011-10-22: qty 1000

## 2011-10-22 MED ORDER — ZOLPIDEM TARTRATE ER 12.5 MG PO TBCR: 12.5000 mg | EXTENDED_RELEASE_TABLET | Freq: Every evening | ORAL | Status: DC | PRN

## 2011-10-22 MED ORDER — OXYTOCIN 20 UNITS IN LACTATED RINGERS INFUSION - SIMPLE
1.0000 m[IU]/min | INTRAVENOUS | Status: DC
  Administered 2011-10-22: 2 m[IU]/min via INTRAVENOUS

## 2011-10-22 MED ORDER — LORAZEPAM 0.5 MG PO TABS: 0.5000 mg | ORAL_TABLET | Freq: Three times a day (TID) | ORAL | Status: AC

## 2011-10-22 NOTE — Progress Notes (Signed)
Post Partum Day 0 Subjective: no complaints, up ad lib and pt has kept fetus with her thru the night. Joshua at bedside  Objective: Blood pressure 103/62, pulse 81, temperature 98.1 F (36.7 C), temperature source Oral, resp. rate 18, height 5\' 3"  (1.6 m), weight 89.041 kg (196 lb 4.8 oz), last menstrual period 05/15/2011, unknown if currently breastfeeding.  Physical Exam:  General: alert, fatigued and no distress Lochia: appropriate Uterine Fundus: firm Incision: n/a DVT Evaluation: No evidence of DVT seen on physical exam.   Basename 10/20/11 1930  HGB 11.6*  HCT 34.5*    Assessment/Plan: Discharge home and Contraception none   LOS: 2 days   Rimsha Trembley V 10/22/2011, 7:33 AM

## 2011-10-22 NOTE — Progress Notes (Signed)
Kimberly Morgan is a 31 y.o. G1P0000 at [redacted]w[redacted]d by LMP admitted for cervical incompetence. She was transferred to L&d once contractions developed. She was examined before midnight and both cerclage stitches removed from anterior lip of cervix, having pulled thru posterior cervix.   Vertex in cervix, will expect delivery soon  Subjective:   Objective: BP 127/76  Pulse 96  Temp(Src) 98.1 F (36.7 C) (Oral)  Resp 18  Ht 5\' 3"  (1.6 m)  Wt 89.041 kg (196 lb 4.8 oz)  BMI 34.77 kg/m2  LMP 05/15/2011  SVE:   Dilation: 5 Exam by:: Leland Raver  Labs: Lab Results  Component Value Date   WBC 17.3* 10/20/2011   HGB 11.6* 10/20/2011   HCT 34.5* 10/20/2011   MCV 90.3 10/20/2011   PLT 244 10/20/2011    Assessment / Plan: pprom, cervical incompetence, 22+6 wks anticipate delivery soon  LabFERGUSON,Cutberto Winfree V 10/22/2011, 1:50 AM

## 2011-10-22 NOTE — Discharge Summary (Signed)
Obstetric Discharge Summary Reason for Admission: cervical incompetence 22+ weeks Prenatal Procedures: cerclage and ultrasound Intrapartum Procedures: spontaneous vaginal delivery, removal of 2 McDonald Cerclage stitches Postpartum Procedures: none Complications-Operative and Postpartum: morbidity and neonatal demise Hemoglobin  Date Value Range Status  10/20/2011 11.6* 12.0-15.0 (g/dL) Final     HCT  Date Value Range Status  10/20/2011 34.5* 36.0-46.0 (%) Final    Physical Exam:  General: alert, moderate distress and emotional Lochia: appropriate Uterine Fundus: firm Incision: healing well DVT Evaluation: No evidence of DVT seen on physical exam.  Discharge Diagnoses: Incompetent cervix, Premature labor and premature delivery 22+6 weeks  Discharge Information: Date: 10/22/2011 Activity: unrestricted and pelvic rest Diet: routine Medications: PNV and Ativan 0.5, Ambien12.5 CR Condition: stable and emotional , grieving Instructions: refer to practice specific booklet Discharge to: home Follow-up Information    Follow up with [name], MD. Schedule an appointment as soon as possible for a visit in 2 weeks. (or earlier as needed)    Contact information:   [facility] [address], [address] [place] [address] [phone]          Newborn Data: Live born female  Birth Weight: 1 lb 1.8 oz (505 g) APGAR: 2, 2  Home with to be transported to [place] home by family. Death Certificates completed.  Jashawn Floyd V 10/22/2011, 7:45 AM

## 2011-10-22 NOTE — Progress Notes (Signed)
Patient ID: Kimberly Morgan, female   DOB: 1980-07-25, 31 y.o.   MRN: [id number] Delivery Note At  a live previable female was delivered via  (Presentation: ;vertex  ).  APGAR:2 ,2 ; weight .  approx 500 gm Placenta status: duncan intcat 3vc, .  Cord:  with the following complications: .  Cord pH: not applicable  Anesthesia:  none Episiotomy: none Lacerations: none Suture Repair: none Est. Blood Loss (mL):   Mom to 3rd floor.  Baby to Expired.  Kimberly Morgan V 10/22/2011, 2:02 AM

## 2011-10-24 LAB — URINE CULTURE

## 2011-10-25 ENCOUNTER — Encounter (HOSPITAL_COMMUNITY): Payer: Self-pay

## 2011-10-25 ENCOUNTER — Emergency Department (HOSPITAL_COMMUNITY)
Admission: EM | Admit: 2011-10-25 | Discharge: 2011-10-25 | Disposition: A | Discharge status: Home / Self Care | Payer: BC Managed Care – PPO | Attending: Emergency Medicine | Admitting: Emergency Medicine

## 2011-10-25 ENCOUNTER — Emergency Department (HOSPITAL_COMMUNITY): Payer: BC Managed Care – PPO

## 2011-10-25 DIAGNOSIS — E039 Hypothyroidism, unspecified: Secondary | ICD-10-CM | POA: Insufficient documentation

## 2011-10-25 DIAGNOSIS — N201 Calculus of ureter: Secondary | ICD-10-CM | POA: Insufficient documentation

## 2011-10-25 DIAGNOSIS — R10814 Left lower quadrant abdominal tenderness: Secondary | ICD-10-CM | POA: Insufficient documentation

## 2011-10-25 DIAGNOSIS — N200 Calculus of kidney: Secondary | ICD-10-CM

## 2011-10-25 DIAGNOSIS — R109 Unspecified abdominal pain: Secondary | ICD-10-CM | POA: Insufficient documentation

## 2011-10-25 LAB — URINALYSIS, ROUTINE W REFLEX MICROSCOPIC
Bilirubin Urine: NEGATIVE
Ketones, ur: NEGATIVE mg/dL
Leukocytes, UA: NEGATIVE
Nitrite: NEGATIVE
Protein, ur: NEGATIVE mg/dL
Urobilinogen, UA: 0.2 mg/dL (ref 0.0–1.0)
pH: 5.5 (ref 5.0–8.0)

## 2011-10-25 LAB — URINE MICROSCOPIC-ADD ON

## 2011-10-25 MED ORDER — OXYCODONE-ACETAMINOPHEN 5-325 MG PO TABS: 1.0000 | ORAL_TABLET | Freq: Four times a day (QID) | ORAL | Status: AC | PRN

## 2011-10-25 MED ORDER — SODIUM CHLORIDE 0.9 % IV SOLN
Freq: Once | INTRAVENOUS | Status: AC
  Administered 2011-10-25: 04:00:00 via INTRAVENOUS

## 2011-10-25 MED ORDER — ONDANSETRON HCL 8 MG PO TABS: 8.0000 mg | ORAL_TABLET | ORAL | Status: AC | PRN

## 2011-10-25 MED ORDER — ONDANSETRON HCL 4 MG/2ML IJ SOLN
INTRAMUSCULAR | Status: AC
  Administered 2011-10-25: 4 mg
  Filled 2011-10-25: qty 2

## 2011-10-25 MED ORDER — ONDANSETRON HCL 4 MG/2ML IJ SOLN
4.0000 mg | Freq: Once | INTRAMUSCULAR | Status: AC
  Administered 2011-10-25: 4 mg via INTRAVENOUS
  Filled 2011-10-25: qty 2

## 2011-10-25 MED ORDER — HYDROMORPHONE HCL PF 1 MG/ML IJ SOLN
INTRAMUSCULAR | Status: AC
  Administered 2011-10-25: 1 mg
  Filled 2011-10-25: qty 1

## 2011-10-25 MED ORDER — FENTANYL CITRATE 0.05 MG/ML IJ SOLN
INTRAMUSCULAR | Status: AC
  Administered 2011-10-25: 50 ug
  Filled 2011-10-25: qty 2

## 2011-10-25 MED ORDER — HYDROMORPHONE HCL PF 1 MG/ML IJ SOLN
1.0000 mg | Freq: Once | INTRAMUSCULAR | Status: AC
  Administered 2011-10-25: 1 mg via INTRAVENOUS
  Filled 2011-10-25: qty 1

## 2011-10-25 MED ORDER — KETOROLAC TROMETHAMINE 30 MG/ML IJ SOLN
30.0000 mg | Freq: Once | INTRAMUSCULAR | Status: AC
  Administered 2011-10-25: 30 mg via INTRAVENOUS
  Filled 2011-10-25: qty 1

## 2011-10-25 MED ORDER — TAMSULOSIN HCL 0.4 MG PO CAPS: 0.4000 mg | ORAL_CAPSULE | Freq: Every day | ORAL | Status: DC

## 2011-10-25 NOTE — ED Notes (Signed)
Pain in left lower back, started about 1 hour ago per pt. Just had a baby on Thursday. (baby did not make it).

## 2011-10-25 NOTE — Discharge Instructions (Signed)
Kidney Stones Kidney stones (ureteral lithiasis) are solid masses that form inside your kidneys. The intense pain is caused by the stone moving through the kidney, ureter, bladder, and urethra (urinary tract). When the stone moves, the ureter starts to spasm around the stone. The stone is usually passed in the urine.  HOME CARE  Drink enough fluids to keep your pee (urine) clear or pale yellow. This helps to get the stone out.   Strain all pee through the provided strainer. Do not pee without peeing through the strainer, not even once. If you pee the stone out, catch it. The stone may be as small as a grain of salt. Take this to your doctor.   Only take medicine as told by your doctor.   Follow up with your doctor as told.   Get follow-up X-rays as told by your doctor.  GET HELP RIGHT AWAY IF:   Your pain does not get better with medicine.   You have a fever.   Your pain increases and gets worse over 18 hours.   You have new belly (abdominal) pain.   You feel faint or pass out.  MAKE SURE YOU:   Understand these instructions.   Will watch your condition.   Will get help right away if you are not doing well or get worse.  Document Released: 10/28/2007 Document Revised: 04/30/2011 Document Reviewed: 03/08/2009 [facility] Patient Information 2012 [place], [place].  You have a small kidney stone. Increase fluids. Medications for pain, nausea and to increase urinary flow.

## 2011-10-25 NOTE — ED Provider Notes (Addendum)
History     CSN: [id number]  Arrival date & time 10/25/11  8295   First MD Initiated Contact with Patient 10/25/11 0413      Chief Complaint  Patient presents with  . Back Pain    (Consider location/radiation/quality/duration/timing/severity/associated sxs/prior treatment) HPI....abrupt onset of left flank approximately 1 hour ago. Pain radiates to the left lower cautery. No dysuria, hematuria, fever, chills, vaginal bleeding. Status post spontaneous abortion several days ago.  Pain is severe and cramping.  Past Medical History  Diagnosis Date  . Hypothyroidism   . Seasonal allergies     Past Surgical History  Procedure Date  . Cholecystectomy     laparoscopic- 2011  . Cervical cerclage 09/30/2011    Procedure: CERCLAGE CERVICAL;  Surgeon: [name], MD;  Location: [facility];  Service: Gynecology;  Laterality: N/A;    Family History  Problem Relation Age of Onset  . Coronary artery disease Mother     History  Substance Use Topics  . Smoking status: Current Some Day Smoker -- 0.2 packs/day for 10 years    Types: Cigarettes  . Smokeless tobacco: Not on file  . Alcohol Use: No    OB History    Grav Para Term Preterm Abortions TAB SAB Ect Mult Living   1 1 0 1 0 0 0 0 0 1       Review of Systems  All other systems reviewed and are negative.    Allergies  Aspirin; Eggs or egg-derived products; Bactrim; and Baker's yeast  Home Medications   Current Outpatient Rx  Name Route Sig Dispense Refill  . LEVOTHYROXINE SODIUM 50 MCG PO TABS Oral Take 50 mcg by mouth daily.    [name] PRENATAL MULTIVITAMIN CH Oral Take 1 tablet by mouth daily.    [name] LORAZEPAM 0.5 MG PO TABS Oral Take 1 tablet (0.5 mg total) by mouth every 8 (eight) hours. 10 tablet 0  . ZOLPIDEM TARTRATE ER 12.5 MG PO TBCR Oral Take 1 tablet (12.5 mg total) by mouth at bedtime as needed for sleep. 10 tablet 0    BP 111/64  Pulse 70  Temp(Src) 98.1 F (36.7 C) (Oral)  Resp 20  Ht 5\' 3"  (1.6 m)  Wt 195  lb (88.451 kg)  BMI 34.54 kg/m2  SpO2 97%  LMP 05/15/2011  Breastfeeding? No  Physical Exam  Nursing note and vitals reviewed. Constitutional: She is oriented to person, place, and time. She appears well-developed and well-nourished.  HENT:  Head: Normocephalic and atraumatic.  Eyes: Conjunctivae and EOM are normal. Pupils are equal, round, and reactive to light.  Neck: Normal range of motion. Neck supple.  Cardiovascular: Normal rate and regular rhythm.   Pulmonary/Chest: Effort normal and breath sounds normal.  Abdominal: Soft. Bowel sounds are normal.       Minimal tenderness left lower cautery and  Genitourinary:       Tender left flank  Musculoskeletal: Normal range of motion.  Neurological: She is alert and oriented to person, place, and time.  Skin: Skin is warm and dry.  Psychiatric: She has a normal mood and affect.    ED Course  Procedures (including critical care time)  Labs Reviewed  URINALYSIS, ROUTINE W REFLEX MICROSCOPIC - Abnormal; Notable for the following:    Specific Gravity, Urine >1.030 (*)    Hgb urine dipstick LARGE (*)    All other components within normal limits  URINE MICROSCOPIC-ADD ON - Abnormal; Notable for the following:    Squamous Epithelial /  LPF FEW (*)    Bacteria, UA FEW (*)    Crystals CA OXALATE CRYSTALS (*)    All other components within normal limits   No results found.   No diagnosis found.    MDM  Suspect kidney stone. Pain management, CT scan pending  0700:  CT scan suggests left-sided stone.  Pain well-controlled.      Donnetta Hutching, MD 10/25/11 4098  Donnetta Hutching, MD 10/25/11 [phone]

## 2012-03-08 ENCOUNTER — Encounter (HOSPITAL_COMMUNITY): Payer: Self-pay

## 2012-03-08 ENCOUNTER — Ambulatory Visit (HOSPITAL_COMMUNITY)
Admission: RE | Admit: 2012-03-08 | Discharge: 2012-03-08 | Disposition: A | Discharge status: Home / Self Care | Payer: BC Managed Care – PPO | Source: Ambulatory Visit | Attending: Obstetrics and Gynecology | Admitting: Obstetrics and Gynecology

## 2012-03-08 DIAGNOSIS — Z3169 Encounter for other general counseling and advice on procreation: Secondary | ICD-10-CM | POA: Insufficient documentation

## 2012-03-08 DIAGNOSIS — O3430 Maternal care for cervical incompetence, unspecified trimester: Secondary | ICD-10-CM

## 2012-03-08 DIAGNOSIS — E039 Hypothyroidism, unspecified: Secondary | ICD-10-CM | POA: Insufficient documentation

## 2012-03-08 DIAGNOSIS — N883 Incompetence of cervix uteri: Secondary | ICD-10-CM | POA: Insufficient documentation

## 2012-03-08 NOTE — Consult Note (Signed)
Maternal Fetal Medicine Consultation  Requesting Provider(s): Marja Kays, MD  Reason for consultation: Hx of 22 week loss, suspected cervical insufficiency  HPI: Kimberly Morgan is a 31 yo G1P0100 who is seen today for preconception counseling.  She is currently undergoing evaluation for ART.  Kimberly Morgan recently delivered at [redacted] weeks gestation after an unsuccessful rescue cerclage.  She reports that in early May at approximately 19 weeks, she was seen in [facility] clinic and noted to have a shortened cervix with fetal membranes at the external os.  At the time, she was asymptomatic without contractions or vaginal bleeding  She underwent a rescue cerclage and was later readmitted at approximately [redacted] weeks gestation and delivered shortly thereafter.  Placental pathology showed acute chorioamnionitis.    Kimberly Morgan reports that she had a ? Cervical biopsy at age 89 and subsequently had cryotherapy for cervical dysplasia.  She otherwise denies any history of Gyn surgery or any known risks factors for incompetent cervix.  She is without complaints today.  Of note, the patient's husband has a history of Hodgkin's Lymphoma s/p XRT and radiation - with hx of female factor infertility - plan ART.   OB History: OB History    Grav Para Term Preterm Abortions TAB SAB Ect Mult Living   1 1 0 1 0 0 0 0 0 0       PMH:  Past Medical History  Diagnosis Date  . Hypothyroidism   . Seasonal allergies   Kidney stones  PSH:  Past Surgical History  Procedure Date  . Cholecystectomy     laparoscopic- 2011  . Cervical cerclage 09/30/2011    Procedure: CERCLAGE CERVICAL;  Surgeon: [name], MD;  Location: [facility];  Service: Gynecology;  Laterality: N/A;   Meds: Synthroid 75 mcg daily  Allergies: Aspirin, Bactrim - swelling, respiratory compromise  FH: Mother with clubbed foot  Soc: denies tobacco, ETOH or illicit drug use  Review of Systems: Non contributory except as per HPI  PE: 202#,  149/99, 112  A/P: 1) Hx of 22 week loss, suspected cervical insufficiency         2) Hypothyroidism on synthroid   Recommendations 1) Based on patient history, would offer prophylactic cervical cerclage with next pregnancy at 12-[redacted] weeks gestation.  I personally would not offer abdominal cerclage due to the fact that she had a single failed rescue cerclage in less that ideal circumstances (dilated cervix/ exposed membranes).  As part of her work up (while not pregnant) - would recommend an imaging study of the uterus (HSG or sonohystogram) to rule out septum or uterine anomaly. 2) Varicella IgG titers ordered    Thank you for the opportunity to be a part of the care of Kimberly Morgan. Please contact our office if we can be of further assistance.   I spent approximately 30 minutes with this patient with over 50% of time spent in face-to-face counseling.  Alpha Gula, MD

## 2012-03-09 LAB — VARICELLA ZOSTER ANTIBODY, IGG: Varicella IgG: 0.08 {ISR} (ref <0.90)

## 2012-03-09 NOTE — Addendum Note (Signed)
Encounter addended by: Duanne Moron, RN on: 03/09/2012  1:38 PM<BR>     Documentation filed: Charges VN

## 2012-03-12 ENCOUNTER — Encounter (HOSPITAL_COMMUNITY): Payer: Self-pay | Admitting: Emergency Medicine

## 2012-03-12 ENCOUNTER — Emergency Department (HOSPITAL_COMMUNITY)
Admission: EM | Admit: 2012-03-12 | Discharge: 2012-03-12 | Disposition: A | Discharge status: Home / Self Care | Payer: BC Managed Care – PPO | Attending: Emergency Medicine | Admitting: Emergency Medicine

## 2012-03-12 DIAGNOSIS — Z91012 Allergy to eggs: Secondary | ICD-10-CM | POA: Insufficient documentation

## 2012-03-12 DIAGNOSIS — E039 Hypothyroidism, unspecified: Secondary | ICD-10-CM | POA: Insufficient documentation

## 2012-03-12 DIAGNOSIS — R300 Dysuria: Secondary | ICD-10-CM

## 2012-03-12 DIAGNOSIS — Z87440 Personal history of urinary (tract) infections: Secondary | ICD-10-CM | POA: Insufficient documentation

## 2012-03-12 DIAGNOSIS — Z87442 Personal history of urinary calculi: Secondary | ICD-10-CM | POA: Insufficient documentation

## 2012-03-12 DIAGNOSIS — Z87891 Personal history of nicotine dependence: Secondary | ICD-10-CM | POA: Insufficient documentation

## 2012-03-12 HISTORY — DX: Urinary tract infection, site not specified: N39.0

## 2012-03-12 HISTORY — DX: Calculus of kidney: N20.0

## 2012-03-12 LAB — URINALYSIS, ROUTINE W REFLEX MICROSCOPIC
Bilirubin Urine: NEGATIVE
Leukocytes, UA: NEGATIVE
Nitrite: NEGATIVE
Specific Gravity, Urine: 1.02 (ref 1.005–1.030)
Urobilinogen, UA: 0.2 mg/dL (ref 0.0–1.0)

## 2012-03-12 LAB — BASIC METABOLIC PANEL
BUN: 11 mg/dL (ref 6–23)
GFR calc Af Amer: >90 mL/min (ref >90)
GFR calc non Af Amer: >90 mL/min (ref >90)
Potassium: 3.6 mEq/L (ref 3.5–5.1)

## 2012-03-12 NOTE — ED Provider Notes (Signed)
Medical screening examination/treatment/procedure(s) were performed by non-physician practitioner and as supervising physician I was immediately available for consultation/collaboration.   Devora Tortorella, MD 03/12/12 1456 

## 2012-03-12 NOTE — ED Provider Notes (Signed)
History     CSN: [id number]  Arrival date & time 03/12/12  4098   First MD Initiated Contact with Patient 03/12/12 0930      Chief Complaint  Patient presents with  . Nocturia  . Dysuria    (Consider location/radiation/quality/duration/timing/severity/associated sxs/prior treatment) HPI Comments: Patient is a 31 year old female who's had a one-week history of increasing urine frequency and bladder pain. The patient describes a heaviness in her bladder and at times a pain in her urethra with urination. The patient has tried AZO with only mild relief. She was treated with a three-day course of Cipro and began to feel a little better a few days after that. The patient was seen by her GYN physician 4 days ago at which time she was told that her urine was clear. She was noted on examination to have some vaginal discharge but she was told by her GYN physician that this was clear as well. Patient has not had any high fever, hematuria, nausea vomiting. There's been no injury or trauma to the bladder or pelvis area. His been no recent change in medication, or diet.  Patient is a 31 y.o. female presenting with dysuria. The history is provided by the patient.  Dysuria  This is a new problem. The problem occurs intermittently. The problem has not changed since onset.The quality of the pain is described as aching. The pain is moderate. There has been no fever. There is no history of pyelonephritis. Associated symptoms include discharge, frequency and urgency. Pertinent negatives include no chills, no nausea, no vomiting and no hematuria. Associated symptoms comments: Back pain. She has tried antibiotics for the symptoms. Her past medical history is significant for kidney stones.    Past Medical History  Diagnosis Date  . Hypothyroidism   . Seasonal allergies   . UTI (lower urinary tract infection)   . Kidney stones     Past Surgical History  Procedure Date  . Cholecystectomy     laparoscopic-  2011  . Cervical cerclage 09/30/2011    Procedure: CERCLAGE CERVICAL;  Surgeon: [name], MD;  Location: [facility];  Service: Gynecology;  Laterality: N/A;    Family History  Problem Relation Age of Onset  . Coronary artery disease Mother     History  Substance Use Topics  . Smoking status: Former Smoker -- 0.2 packs/day for 10 years    Types: Cigarettes    Quit date: 07/24/2011  . Smokeless tobacco: Never Used  . Alcohol Use: No    OB History    Grav Para Term Preterm Abortions TAB SAB Ect Mult Living   1 1 0 1 0 0 0 0 0 0       Review of Systems  Constitutional: Negative for chills and activity change.       All ROS Neg except as noted in HPI  HENT: Negative for nosebleeds and neck pain.   Eyes: Negative for photophobia and discharge.  Respiratory: Negative for cough, shortness of breath and wheezing.   Cardiovascular: Negative for chest pain and palpitations.  Gastrointestinal: Negative for nausea, vomiting, abdominal pain and blood in stool.  Genitourinary: Positive for dysuria, urgency and frequency. Negative for hematuria.  Musculoskeletal: Negative for back pain and arthralgias.  Skin: Negative.   Neurological: Negative for dizziness, seizures and speech difficulty.  Psychiatric/Behavioral: Negative for hallucinations and confusion.    Allergies  Aspirin; Eggs or egg-derived products; Bactrim; and Baker's yeast  Home Medications   Current Outpatient Rx  Name Route  Sig Dispense Refill  . LEVOTHYROXINE SODIUM 50 MCG PO TABS Oral Take 50 mcg by mouth daily.    [name] PRENATAL MULTIVITAMIN CH Oral Take 1 tablet by mouth daily.    [name] TAMSULOSIN HCL 0.4 MG PO CAPS Oral Take 1 capsule (0.4 mg total) by mouth daily. 15 capsule 0  . ZOLPIDEM TARTRATE ER 12.5 MG PO TBCR Oral Take 1 tablet (12.5 mg total) by mouth at bedtime as needed for sleep. 10 tablet 0    BP 137/99  Pulse 115  Temp 98.8 F (37.1 C) (Oral)  Resp 16  Ht 5\' 3"  (1.6 m)  Wt 200 lb (90.719 kg)  BMI  35.43 kg/m2  SpO2 100%  LMP 02/23/2012  Physical Exam  Nursing note and vitals reviewed. Constitutional: She is oriented to person, place, and time. She appears well-developed and well-nourished.  Non-toxic appearance. No distress.  HENT:  Head: Normocephalic.  Right Ear: Tympanic membrane and external ear normal.  Left Ear: Tympanic membrane and external ear normal.  Eyes: EOM and lids are normal. Pupils are equal, round, and reactive to light.  Neck: Normal range of motion. Neck supple. Carotid bruit is not present.  Cardiovascular: Regular rhythm, normal heart sounds, intact distal pulses and normal pulses.  Tachycardia present.        Patient states the tachycardia is not new for her. States that her mother and on all have passed heart rates. The patient is being evaluated by her primary for the source of this problem.  Pulmonary/Chest: Breath sounds normal. No respiratory distress.  Abdominal: Soft. Bowel sounds are normal. There is no guarding.       Mild discomfort at the right and left lower quadrants. Moderate pain at the suprapubic area.  Musculoskeletal: Normal range of motion.  Lymphadenopathy:       Head (right side): No submandibular adenopathy present.       Head (left side): No submandibular adenopathy present.    She has no cervical adenopathy.  Neurological: She is alert and oriented to person, place, and time. She has normal strength. No cranial nerve deficit or sensory deficit. She exhibits normal muscle tone. Coordination normal.  Skin: Skin is warm and dry.  Psychiatric: She has a normal mood and affect. Her speech is normal.    ED Course  Procedures (including critical care time)   Labs Reviewed  GLUCOSE, CAPILLARY  POCT PREGNANCY, URINE  URINALYSIS, ROUTINE W REFLEX MICROSCOPIC  BASIC METABOLIC PANEL   No results found. Pulse Ox 100% on room air. WNL by my interpretation.  No diagnosis found.    MDM  I have reviewed nursing notes, vital signs, and  all appropriate lab and imaging results for this patient. UA wnl except for slight elevation in ketones. Preg test neg. Bmet non-acute. Pt has Gyn evaluation this week and no acute changes noted. Suggested pt see Urology for possible over active bladder or other problems of dysuria.       Kathie Dike, [place] 03/12/12 1229

## 2012-03-12 NOTE — ED Notes (Signed)
Patient c/o urinary frequency and "bladder pain." x1 week. Patient reports going to urgent care-did not run a UA on patient because she was already taking azo pills-given Cipro for UTI. Followed up with OB on Tuesday, ran UA they and checked her for discharge-clear. Denies any blood in urine, nausea, or vomiting. Reports feeling very thirsty.

## 2012-04-14 ENCOUNTER — Other Ambulatory Visit (HOSPITAL_COMMUNITY)
Admission: RE | Admit: 2012-04-14 | Discharge: 2012-04-14 | Disposition: A | Discharge status: Home / Self Care | Payer: BC Managed Care – PPO | Source: Ambulatory Visit | Attending: Obstetrics & Gynecology | Admitting: Obstetrics & Gynecology

## 2012-04-14 ENCOUNTER — Other Ambulatory Visit: Payer: Self-pay | Admitting: Obstetrics & Gynecology

## 2012-04-14 DIAGNOSIS — Z01419 Encounter for gynecological examination (general) (routine) without abnormal findings: Secondary | ICD-10-CM | POA: Insufficient documentation

## 2012-05-25 NOTE — L&D Delivery Note (Signed)
Delivery Note At 3:04 PM a viable female was delivered via Vaginal, Spontaneous Delivery (Presentation: Right Occiput Anterior).  APGAR: 7, 8; weight pending .   Placenta status: Intact, Spontaneous.  Cord: 3 vessels with the following complications: slow respirations and tachycardia.  Blowby O2 given after primary resuscitation.    Anesthesia: Epidural Local: Lidocaine  Episiotomy: None Lacerations: 2nd degree vaginal with right labial Suture Repair: 3.0 vicryl Est. Blood Loss (mL): 300 mL  Mom to postpartum.  Baby to Nursery.  STINSON, JACOB JEHIEL 05/21/2013, 3:59 PM

## 2012-10-24 ENCOUNTER — Other Ambulatory Visit: Payer: Self-pay | Admitting: Obstetrics & Gynecology

## 2012-10-24 DIAGNOSIS — O3680X0 Pregnancy with inconclusive fetal viability, not applicable or unspecified: Secondary | ICD-10-CM

## 2012-10-25 ENCOUNTER — Encounter: Payer: Self-pay | Admitting: *Deleted

## 2012-10-25 ENCOUNTER — Encounter: Payer: Self-pay | Admitting: Obstetrics and Gynecology

## 2012-10-26 ENCOUNTER — Ambulatory Visit (INDEPENDENT_AMBULATORY_CARE_PROVIDER_SITE_OTHER): Payer: BC Managed Care – PPO

## 2012-10-26 DIAGNOSIS — O3680X0 Pregnancy with inconclusive fetal viability, not applicable or unspecified: Secondary | ICD-10-CM

## 2012-10-26 DIAGNOSIS — O09299 Supervision of pregnancy with other poor reproductive or obstetric history, unspecified trimester: Secondary | ICD-10-CM

## 2012-10-26 DIAGNOSIS — O0900 Supervision of pregnancy with history of infertility, unspecified trimester: Secondary | ICD-10-CM

## 2012-10-26 DIAGNOSIS — O0901 Supervision of pregnancy with history of infertility, first trimester: Secondary | ICD-10-CM

## 2012-10-26 NOTE — Progress Notes (Signed)
U/s performed, single, living, iup, meas c/w dates, cx long and closed, bilat ovs observed

## 2012-10-31 ENCOUNTER — Encounter: Payer: Self-pay | Admitting: Adult Health

## 2012-10-31 ENCOUNTER — Ambulatory Visit (INDEPENDENT_AMBULATORY_CARE_PROVIDER_SITE_OTHER): Payer: BC Managed Care – PPO | Admitting: Adult Health

## 2012-10-31 VITALS — BP 122/80 | Wt 210.0 lb

## 2012-10-31 DIAGNOSIS — E079 Disorder of thyroid, unspecified: Secondary | ICD-10-CM

## 2012-10-31 DIAGNOSIS — O09819 Supervision of pregnancy resulting from assisted reproductive technology, unspecified trimester: Secondary | ICD-10-CM

## 2012-10-31 DIAGNOSIS — O3430 Maternal care for cervical incompetence, unspecified trimester: Secondary | ICD-10-CM | POA: Insufficient documentation

## 2012-10-31 DIAGNOSIS — Z1389 Encounter for screening for other disorder: Secondary | ICD-10-CM

## 2012-10-31 DIAGNOSIS — E039 Hypothyroidism, unspecified: Secondary | ICD-10-CM

## 2012-10-31 DIAGNOSIS — Z3481 Encounter for supervision of other normal pregnancy, first trimester: Secondary | ICD-10-CM

## 2012-10-31 DIAGNOSIS — Z3480 Encounter for supervision of other normal pregnancy, unspecified trimester: Secondary | ICD-10-CM

## 2012-10-31 DIAGNOSIS — N883 Incompetence of cervix uteri: Secondary | ICD-10-CM

## 2012-10-31 HISTORY — DX: Incompetence of cervix uteri: N88.3

## 2012-10-31 HISTORY — DX: Hypothyroidism, unspecified: E03.9

## 2012-10-31 NOTE — Progress Notes (Signed)
Pt here today for New OB visit. Pt denies any problems

## 2012-10-31 NOTE — Patient Instructions (Addendum)
Return in 1 week to see JVF to talk about cerclage and ? Progesterone shots

## 2012-10-31 NOTE — Progress Notes (Signed)
Kimberly Morgan is in for a new ob visit,she had IVF 09/09/12.+fhm seen on [place], no bleeding,discharge or complaints.She did have a feeling of low blood sugar and was fine after eating.She had a preterm delivery at 22+6 due to incompetent cervix, and will need a cerclage put in.She has questions about progesterone shots, will schedule appt with Dr. Emelda Fear to discuss in about a week or so.Labs drawn today, she had normal pap on 04/14/12.

## 2012-11-01 LAB — URINALYSIS
Leukocytes, UA: NEGATIVE
Nitrite: NEGATIVE
Specific Gravity, Urine: 1.019 (ref 1.005–1.030)
Urobilinogen, UA: 0.2 mg/dL (ref 0.0–1.0)

## 2012-11-01 LAB — HEPATITIS B SURFACE ANTIGEN: Hepatitis B Surface Ag: NEGATIVE

## 2012-11-01 LAB — COMPREHENSIVE METABOLIC PANEL
ALT: 15 U/L (ref 0–35)
Albumin: 3.6 g/dL (ref 3.5–5.2)
CO2: 22 mEq/L (ref 19–32)
Calcium: 9 mg/dL (ref 8.4–10.5)
Chloride: 104 mEq/L (ref 96–112)
Creat: 0.55 mg/dL (ref 0.50–1.10)
Glucose, Bld: 74 mg/dL (ref 70–99)
Total Bilirubin: 0.3 mg/dL (ref 0.3–1.2)

## 2012-11-01 LAB — OXYCODONE SCREEN, UA, RFLX CONFIRM: Oxycodone Screen, Ur: NEGATIVE ng/mL

## 2012-11-01 LAB — CBC
Platelets: 289 10*3/uL (ref 150–400)
RBC: 4.25 MIL/uL (ref 3.87–5.11)
WBC: 13.3 10*3/uL — ABNORMAL HIGH (ref 4.0–10.5)

## 2012-11-01 LAB — DRUG SCREEN, URINE, NO CONFIRMATION
Marijuana Metabolite: NEGATIVE
Methadone: NEGATIVE
Opiate Screen, Urine: NEGATIVE
Propoxyphene: NEGATIVE

## 2012-11-01 LAB — PROGESTERONE: Progesterone: 33 ng/mL

## 2012-11-01 LAB — ABO AND RH: Rh Type: POSITIVE

## 2012-11-02 LAB — VARICELLA ZOSTER ANTIBODY, IGG: Varicella IgG: <10 Index (ref <135.00)

## 2012-11-02 LAB — URINE CULTURE

## 2012-11-04 ENCOUNTER — Encounter: Payer: Self-pay | Admitting: Obstetrics and Gynecology

## 2012-11-04 ENCOUNTER — Ambulatory Visit (INDEPENDENT_AMBULATORY_CARE_PROVIDER_SITE_OTHER): Payer: BC Managed Care – PPO | Admitting: Obstetrics and Gynecology

## 2012-11-04 VITALS — BP 132/78 | Wt 208.8 lb

## 2012-11-04 DIAGNOSIS — E079 Disorder of thyroid, unspecified: Secondary | ICD-10-CM

## 2012-11-04 DIAGNOSIS — E039 Hypothyroidism, unspecified: Secondary | ICD-10-CM

## 2012-11-04 DIAGNOSIS — Z1389 Encounter for screening for other disorder: Secondary | ICD-10-CM

## 2012-11-04 DIAGNOSIS — O09819 Supervision of pregnancy resulting from assisted reproductive technology, unspecified trimester: Secondary | ICD-10-CM

## 2012-11-04 DIAGNOSIS — N883 Incompetence of cervix uteri: Secondary | ICD-10-CM

## 2012-11-04 LAB — POCT URINALYSIS DIPSTICK
Blood, UA: NEGATIVE
Glucose, UA: NEGATIVE
Nitrite, UA: NEGATIVE
Protein, UA: NEGATIVE
Protein, UA: NEGATIVE

## 2012-11-04 MED ORDER — LEVOTHYROXINE SODIUM 100 MCG PO TABS: 100.0000 ug | ORAL_TABLET | Freq: Every day | ORAL | Status: DC

## 2012-11-04 NOTE — Patient Instructions (Signed)
Keep u/s schedule for Integrated testing (IT).  Followup 2 wk

## 2012-11-04 NOTE — Progress Notes (Signed)
Pt here today for routine visit. Pt states that they have changed their mind and they do want to do the IT testing. Pt denies any problems at this time. Here to discuss cerclage.  1.Plan for cerclage at 14 weeks. Approx.  July 11(Friday).   Will proceed with IT ultrasound and cervix length at  That time 2. Hypothyroidism  TSH 2.1 on 75 mcg/d synthroid, will increase to 100 mcg at next Rx refill, esig sent. F/u 2 wk.  Dirk Dress

## 2012-11-21 ENCOUNTER — Other Ambulatory Visit: Payer: Self-pay | Admitting: Obstetrics & Gynecology

## 2012-11-21 ENCOUNTER — Ambulatory Visit (INDEPENDENT_AMBULATORY_CARE_PROVIDER_SITE_OTHER): Payer: BC Managed Care – PPO | Admitting: Obstetrics and Gynecology

## 2012-11-21 ENCOUNTER — Telehealth: Payer: Self-pay | Admitting: Obstetrics and Gynecology

## 2012-11-21 ENCOUNTER — Encounter: Payer: Self-pay | Admitting: Obstetrics and Gynecology

## 2012-11-21 ENCOUNTER — Other Ambulatory Visit: Payer: Self-pay | Admitting: Obstetrics and Gynecology

## 2012-11-21 ENCOUNTER — Encounter (HOSPITAL_COMMUNITY): Payer: Self-pay | Admitting: Pharmacy Technician

## 2012-11-21 ENCOUNTER — Ambulatory Visit (INDEPENDENT_AMBULATORY_CARE_PROVIDER_SITE_OTHER): Payer: BC Managed Care – PPO

## 2012-11-21 VITALS — BP 130/68 | Wt 212.0 lb

## 2012-11-21 DIAGNOSIS — O09299 Supervision of pregnancy with other poor reproductive or obstetric history, unspecified trimester: Secondary | ICD-10-CM

## 2012-11-21 DIAGNOSIS — O09811 Supervision of pregnancy resulting from assisted reproductive technology, first trimester: Secondary | ICD-10-CM

## 2012-11-21 DIAGNOSIS — E079 Disorder of thyroid, unspecified: Secondary | ICD-10-CM

## 2012-11-21 DIAGNOSIS — Z36 Encounter for antenatal screening of mother: Secondary | ICD-10-CM

## 2012-11-21 DIAGNOSIS — O09819 Supervision of pregnancy resulting from assisted reproductive technology, unspecified trimester: Secondary | ICD-10-CM

## 2012-11-21 DIAGNOSIS — N883 Incompetence of cervix uteri: Secondary | ICD-10-CM

## 2012-11-21 NOTE — Telephone Encounter (Signed)
Spoke with pt's husband, he voiced concerns about Kimberly Morgan's surgery and about her cervix. Kimberly Morgan voiced that Kimberly Morgan was very concerned about a comment made by Kimberly Morgan about her cervix. I tried to reassure Kimberly Morgan that things are fine but I would discuss things with Kimberly Morgan and one of [place] will call him back.

## 2012-11-21 NOTE — Telephone Encounter (Signed)
Spoke with Dr. Emelda Fear and he advised that the pt had a long vaginal length and that the cervix was a good length at this time. I called and spoke with Josh and told him the above and advised that Dr. Emelda Fear would call them back about Kimberly Morgan's surgery.

## 2012-11-21 NOTE — Patient Instructions (Addendum)
Thank you for coming in today You and the baby are doing well We will schedule you for your cerclage on July 10th Please come in on the 9th for an ultrasound to determine your true cervical length  Cerclage of the Cervix Cerclage of the cervix is a surgical procedure for an incompetent cervix. An incompetent cervix is a weak cervix that opens up before labor begins. Cerclage of the cervix sews the cervix closed during pregnancy.  LET YOUR CAREGIVER KNOW ABOUT:   Allergies to foods or medications.  All over-the-counter, prescription, herbal, eye drops and cream medications you are using.  Taking illegal drugs or drinking an excessive amount of alcohol.  Any recent colds or infections.  Past problems with anesthetics or novocaine.  Past surgery.  History of blood clots or abnormal bleeding problems.  Other medical or health problems. RISKS AND COMPLICATIONS   Infection.  Bleeding.  Rupturing the amniotic sac (membranes).  Going into early labor and delivery.  Problems with the anesthesia.  Infection of the amniotic sac. BEFORE THE PROCEDURE   Do not take aspirin.  Do not eat or drink anything 8 hours before the procedure.  Do not smoke.  If you are being admitted the same day as the procedure, arrive at the hospital at least 60 minutes before the surgery or as directed. During this time, you will sign the necessary forms and get prepared for the surgery.  A waiting area is available for family and friends. PROCEDURE   You will be given an IV (intravenous) and medication to relax you.  You will be put to sleep with a general anesthetic.  A stitch will be placed in and around the cervix to tighten it and keep it closed. AFTER THE PROCEDURE   You will go to a recovery room where you and the baby are monitored.  Once you are awake, stable, and taking fluids well, barring other problems, you will be allowed to return to your room.  You will usually stay in the  hospital overnight.  You may get an injection of progesterone to prevent uterine contractions.  Have someone drive you home and stay with you for a day or two.  You may be given medications to take when you go home. HOME CARE INSTRUCTIONS   Only take over-the-counter or prescriptions medicines for pain, discomfort or fever as directed by your caregiver.  Avoid physical activities and exercise until your caregiver says it is okay.  Resume your usual diet.  Do not douche.  Do not have sexual intercourse until your caregiver tells you it is OK.  Keep your follow up surgical and prenatal appointments with your caregiver. SEEK MEDICAL CARE IF:   You have abnormal vaginal discharge.  You develop a rash.  You are having problems with your medications.  You become lightheaded or feel faint. SEEK IMMEDIATE MEDICAL CARE IF:   You develop vaginal bleeding.  You are leaking fluid or have a gush of fluid from the vagina.  You develop a temperature of 102 F (38.9 C) or higher.  You pass out.  You have uterine contractions.  You feel the baby is not moving as much as usual or cannot feel the baby move. Document Released: 04/23/2008 Document Revised: 08/03/2011 Document Reviewed: 04/23/2008 [facility] Patient Information 2014 [place], [place].

## 2012-11-21 NOTE — Progress Notes (Signed)
S: C/o lower back, pt states clear discharge. Also wants IT testing today. Will do ultrasound to check NT, and will check cervical length, measured today at over 4 cm length, with no funnelling or dynamic changes. O: Speculum : physiologic d/c, long vaginal length, and small cervix only protruding a short length in to vagina; will be a technically difficult cerclage.  cervical length, measured today at over 4 cm length,      Phone call back from pt's husband who wants cerclage at [facility]. Will call him back to discuss.\ A Hx cervical incompetence, for McDonald Cerclage 14 weeks. Will schedule for July 10. Hospital to be decided after conversation

## 2012-11-22 DIAGNOSIS — O09819 Supervision of pregnancy resulting from assisted reproductive technology, unspecified trimester: Secondary | ICD-10-CM | POA: Insufficient documentation

## 2012-11-23 ENCOUNTER — Encounter (HOSPITAL_COMMUNITY)
Admission: RE | Admit: 2012-11-23 | Discharge: 2012-11-23 | Disposition: A | Discharge status: Home / Self Care | Payer: BC Managed Care – PPO | Source: Ambulatory Visit

## 2012-11-23 NOTE — Patient Instructions (Addendum)
Kimberly Morgan  11/23/2012   Your procedure is scheduled on:  12/01/2012  Report to [facility] at  615  AM.  Call this number if you have problems the morning of surgery: [phone]   Remember:   Do not eat food or drink liquids after midnight.   Take these medicines the morning of surgery with A SIP OF WATER: synthroid   Do not wear jewelry, make-up or nail polish.  Do not wear lotions, powders, or perfumes.   Do not shave 48 hours prior to surgery. Men may shave face and neck.  Do not bring valuables to the hospital.  [facility] is not responsible for any belongings or valuables.  Contacts, dentures or bridgework may not be worn into surgery.  Leave suitcase in the car. After surgery it may be brought to your room.  For patients admitted to the hospital, checkout time is 11:00 AM the day of discharge.   Patients discharged the day of surgery will not be allowed to drive  home.  Name and phone number of your driver: family  Special Instructions: Shower using CHG 2 nights before surgery and the night before surgery.  If you shower the day of surgery use CHG.  Use special wash - you have one bottle of CHG for all showers.  You should use approximately 1/3 of the bottle for each shower.   Please read over the following fact sheets that you were given: Pain Booklet, Coughing and Deep Breathing, MRSA Information, Surgical Site Infection Prevention, Anesthesia Post-op Instructions and Care and Recovery After Surgery Cerclage of the Cervix Cerclage of the cervix is a surgical procedure for an incompetent cervix. An incompetent cervix is a weak cervix that opens up before labor begins. Cerclage of the cervix sews the cervix closed during pregnancy.  LET YOUR CAREGIVER KNOW ABOUT:   Allergies to foods or medications.  All over-the-counter, prescription, herbal, eye drops and cream medications you are using.  Taking illegal drugs or drinking an excessive amount of alcohol.  Any  recent colds or infections.  Past problems with anesthetics or novocaine.  Past surgery.  History of blood clots or abnormal bleeding problems.  Other medical or health problems. RISKS AND COMPLICATIONS   Infection.  Bleeding.  Rupturing the amniotic sac (membranes).  Going into early labor and delivery.  Problems with the anesthesia.  Infection of the amniotic sac. BEFORE THE PROCEDURE   Do not take aspirin.  Do not eat or drink anything 8 hours before the procedure.  Do not smoke.  If you are being admitted the same day as the procedure, arrive at the hospital at least 60 minutes before the surgery or as directed. During this time, you will sign the necessary forms and get prepared for the surgery.  A waiting area is available for family and friends. PROCEDURE   You will be given an IV (intravenous) and medication to relax you.  You will be put to sleep with a general anesthetic.  A stitch will be placed in and around the cervix to tighten it and keep it closed. AFTER THE PROCEDURE   You will go to a recovery room where you and the baby are monitored.  Once you are awake, stable, and taking fluids well, barring other problems, you will be allowed to return to your room.  You will usually stay in the hospital overnight.  You may get an injection of progesterone to prevent uterine contractions.  Have someone  drive you home and stay with you for a day or two.  You may be given medications to take when you go home. HOME CARE INSTRUCTIONS   Only take over-the-counter or prescriptions medicines for pain, discomfort or fever as directed by your caregiver.  Avoid physical activities and exercise until your caregiver says it is okay.  Resume your usual diet.  Do not douche.  Do not have sexual intercourse until your caregiver tells you it is OK.  Keep your follow up surgical and prenatal appointments with your caregiver. SEEK MEDICAL CARE IF:   You have  abnormal vaginal discharge.  You develop a rash.  You are having problems with your medications.  You become lightheaded or feel faint. SEEK IMMEDIATE MEDICAL CARE IF:   You develop vaginal bleeding.  You are leaking fluid or have a gush of fluid from the vagina.  You develop a temperature of 102 F (38.9 C) or higher.  You pass out.  You have uterine contractions.  You feel the baby is not moving as much as usual or cannot feel the baby move. Document Released: 04/23/2008 Document Revised: 08/03/2011 Document Reviewed: 04/23/2008 [facility] Patient Information 2014 [place], [place]. PATIENT INSTRUCTIONS POST-ANESTHESIA  IMMEDIATELY FOLLOWING SURGERY:  Do not drive or operate machinery for the first twenty four hours after surgery.  Do not make any important decisions for twenty four hours after surgery or while taking narcotic pain medications or sedatives.  If you develop intractable nausea and vomiting or a severe headache please notify your doctor immediately.  FOLLOW-UP:  Please make an appointment with your surgeon as instructed. You do not need to follow up with anesthesia unless specifically instructed to do so.  WOUND CARE INSTRUCTIONS (if applicable):  Keep a dry clean dressing on the anesthesia/puncture wound site if there is drainage.  Once the wound has quit draining you may leave it open to air.  Generally you should leave the bandage intact for twenty four hours unless there is drainage.  If the epidural site drains for more than 36-48 hours please call the anesthesia department.  QUESTIONS?:  Please feel free to call your physician or the hospital operator if you have any questions, and they will be happy to assist you.

## 2012-11-28 NOTE — Patient Instructions (Addendum)
Kimberly Morgan  11/28/2012   Your procedure is scheduled on:   12/01/2012   Report to [facility] at  615  AM.  Call this number if you have problems the morning of surgery: [phone]   Remember:   Do not eat food or drink liquids after midnight.   Take these medicines the morning of surgery with A SIP OF WATER: family   Do not wear jewelry, make-up or nail polish.  Do not wear lotions, powders, or perfumes.  Do not shave 48 hours prior to surgery. Men may shave face and neck.  Do not bring valuables to the hospital.  [facility] is not responsible  for any belongings or valuables.  Contacts, dentures or bridgework may not be worn into surgery.  Leave suitcase in the car. After surgery it may be brought to your room.  For patients admitted to the hospital, checkout time is 11:00 AM the day of discharge.   Patients discharged the day of surgery will not be allowed to drive home.  Name and phone number of your driver: family  Special Instructions: Shower using CHG 2 nights before surgery and the night before surgery.  If you shower the day of surgery use CHG.  Use special wash - you have one bottle of CHG for all showers.  You should use approximately 1/3 of the bottle for each shower.   Please read over the following fact sheets that you were given: Pain Booklet, Coughing and Deep Breathing, Surgical Site Infection Prevention, Anesthesia Post-op Instructions and Care and Recovery After Surgery Cerclage of the Cervix Cerclage of the cervix is a surgical procedure for an incompetent cervix. An incompetent cervix is a weak cervix that opens up before labor begins. Cerclage of the cervix sews the cervix closed during pregnancy.  LET YOUR CAREGIVER KNOW ABOUT:   Allergies to foods or medications.  All over-the-counter, prescription, herbal, eye drops and cream medications you are using.  Taking illegal drugs or drinking an excessive amount of alcohol.  Any recent colds or  infections.  Past problems with anesthetics or novocaine.  Past surgery.  History of blood clots or abnormal bleeding problems.  Other medical or health problems. RISKS AND COMPLICATIONS   Infection.  Bleeding.  Rupturing the amniotic sac (membranes).  Going into early labor and delivery.  Problems with the anesthesia.  Infection of the amniotic sac. BEFORE THE PROCEDURE   Do not take aspirin.  Do not eat or drink anything 8 hours before the procedure.  Do not smoke.  If you are being admitted the same day as the procedure, arrive at the hospital at least 60 minutes before the surgery or as directed. During this time, you will sign the necessary forms and get prepared for the surgery.  A waiting area is available for family and friends. PROCEDURE   You will be given an IV (intravenous) and medication to relax you.  You will be put to sleep with a general anesthetic.  A stitch will be placed in and around the cervix to tighten it and keep it closed. AFTER THE PROCEDURE   You will go to a recovery room where you and the baby are monitored.  Once you are awake, stable, and taking fluids well, barring other problems, you will be allowed to return to your room.  You will usually stay in the hospital overnight.  You may get an injection of progesterone to prevent uterine contractions.  Have someone drive  you home and stay with you for a day or two.  You may be given medications to take when you go home. HOME CARE INSTRUCTIONS   Only take over-the-counter or prescriptions medicines for pain, discomfort or fever as directed by your caregiver.  Avoid physical activities and exercise until your caregiver says it is okay.  Resume your usual diet.  Do not douche.  Do not have sexual intercourse until your caregiver tells you it is OK.  Keep your follow up surgical and prenatal appointments with your caregiver. SEEK MEDICAL CARE IF:   You have abnormal vaginal  discharge.  You develop a rash.  You are having problems with your medications.  You become lightheaded or feel faint. SEEK IMMEDIATE MEDICAL CARE IF:   You develop vaginal bleeding.  You are leaking fluid or have a gush of fluid from the vagina.  You develop a temperature of 102 F (38.9 C) or higher.  You pass out.  You have uterine contractions.  You feel the baby is not moving as much as usual or cannot feel the baby move. Document Released: 04/23/2008 Document Revised: 08/03/2011 Document Reviewed: 04/23/2008 [facility] Patient Information 2014 [place], [place]. PATIENT INSTRUCTIONS POST-ANESTHESIA  IMMEDIATELY FOLLOWING SURGERY:  Do not drive or operate machinery for the first twenty four hours after surgery.  Do not make any important decisions for twenty four hours after surgery or while taking narcotic pain medications or sedatives.  If you develop intractable nausea and vomiting or a severe headache please notify your doctor immediately.  FOLLOW-UP:  Please make an appointment with your surgeon as instructed. You do not need to follow up with anesthesia unless specifically instructed to do so.  WOUND CARE INSTRUCTIONS (if applicable):  Keep a dry clean dressing on the anesthesia/puncture wound site if there is drainage.  Once the wound has quit draining you may leave it open to air.  Generally you should leave the bandage intact for twenty four hours unless there is drainage.  If the epidural site drains for more than 36-48 hours please call the anesthesia department.  QUESTIONS?:  Please feel free to call your physician or the hospital operator if you have any questions, and they will be happy to assist you.

## 2012-11-29 ENCOUNTER — Other Ambulatory Visit: Payer: Self-pay | Admitting: Obstetrics & Gynecology

## 2012-11-29 ENCOUNTER — Encounter (HOSPITAL_COMMUNITY)
Admission: RE | Admit: 2012-11-29 | Discharge: 2012-11-29 | Disposition: A | Discharge status: Home / Self Care | Payer: BC Managed Care – PPO | Source: Ambulatory Visit | Attending: Obstetrics and Gynecology | Admitting: Obstetrics and Gynecology

## 2012-11-29 ENCOUNTER — Encounter (HOSPITAL_COMMUNITY): Payer: Self-pay

## 2012-11-29 ENCOUNTER — Other Ambulatory Visit: Payer: Self-pay | Admitting: Obstetrics and Gynecology

## 2012-11-29 DIAGNOSIS — O09299 Supervision of pregnancy with other poor reproductive or obstetric history, unspecified trimester: Secondary | ICD-10-CM

## 2012-11-29 HISTORY — DX: Other specified postprocedural states: R11.2

## 2012-11-29 HISTORY — DX: Other specified postprocedural states: Z98.890

## 2012-11-29 LAB — CBC
HCT: 37.6 % (ref 36.0–46.0)
MCHC: 34.3 g/dL (ref 30.0–36.0)
Platelets: 247 10*3/uL (ref 150–400)
RDW: 13.1 % (ref 11.5–15.5)

## 2012-11-29 LAB — URINALYSIS, ROUTINE W REFLEX MICROSCOPIC
Hgb urine dipstick: NEGATIVE
Protein, ur: NEGATIVE mg/dL
Urobilinogen, UA: 0.2 mg/dL (ref 0.0–1.0)

## 2012-11-29 NOTE — H&P (Signed)
Kimberly Morgan is an 32 y.o. female. She is admitted for placement of a McDonald cerclage due to history of cervical incompetence with her last pregnancy resulting in pregnancy loss at 21-[redacted] weeks gestation after rescue cerclage placed just before diagnosis of pregnancy loss. Current pregnancy is a result of ICSI through Dr. Elesa Hacker, an ultrasound last week shows a  normally developing pregnancy with fetal heart tones documented, and cervical length measuring 4.1 cm last week. The patient has been made aware of the risk of cerclage,, the slight risk of membrane rupture associated with cerclage placement potential for bleeding, cervical irritation etc. Cerclage was recommended both by me and Dr. Elesa Hacker.  Pertinent Gynecological History: Menses: Pregnant LMP 02/23/2012, [facility] 06/02/2012 based on implantation date by ICSI Bleeding: None this pregnancy Contraception: Pregnant DES exposure: unknown Blood transfusions: none Sexually transmitted diseases: no past history Previous GYN Procedures: Prior McDonald cerclage placed with last pregnancy upon diagnosis of cervical incompetency and [redacted] weeks gestation  Last mammogram:  Date:  Last pap:  Date:  OB History: G2, P0100   Menstrual History: Menarche age:  Patient's last menstrual period was 02/23/2012.    Past Medical History  Diagnosis Date  . Hypothyroidism   . Seasonal allergies   . UTI (lower urinary tract infection)   . Kidney stones   . Preterm labor   . 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 Hypothyroid 10/31/2012    Past Surgical History  Procedure Laterality Date  . Cholecystectomy      laparoscopic- 2011  . Cervical cerclage  09/30/2011    Procedure: CERCLAGE CERVICAL;  Surgeon: [name], MD;  Location: [facility];  Service: Gynecology;  Laterality: N/A;    Family History  Problem Relation Age of Onset  . Coronary artery disease Mother   . Hypertension Sister   . Cancer Maternal Grandfather     lung  . Diabetes Paternal  Grandfather     Social History:  reports that she quit smoking about 16 months ago. Her smoking use included Cigarettes. She has a 2.5 pack-year smoking history. She has never used smokeless tobacco. She reports that she does not drink alcohol or use illicit drugs.  Allergies:  Allergies include aspirin and egg products  (Not in a hospital admission)  ROS  Last menstrual period 02/23/2012. Physical Exam Physical Examination: General appearance - alert, well appearing, and in no distress, oriented to person, place, and time, normal appearing weight and anxious Mental status - alert, oriented to person, place, and time, normal mood, behavior, speech, dress, motor activity, and thought processes Heart - normal rate and regular rhythm Abdomen - soft, nontender, nondistended, no masses or organomegaly Pelvic - VULVA: normal appearing vulva with no masses, tenderness or lesions, VAGINA: normal appearing vagina with normal color and discharge, no lesions, CERVIX: normal appearing cervix without discharge or lesions, cervix is relatively short, at least the portion which protrudes into the vagina and ultrasound shows that there is good cervical length above the visible cervix, UTERUS: uterus is normal size, shape, consistency and nontender, enlarged to 13-14 week's size Extremities - peripheral pulses normal, no pedal edema, no clubbing or cyanosis   No results found for this or any previous visit (from the past 24 hour(s)).  No results found.  Assessment/Plan: Pregnancy 13 weeks 6 days blood type O+ History of cervical incompetence with prior pregnancy Pregnancy the result of ICSI per Dr. Elesa Hacker Plan: McDonald cerclage placement through day surgery 12/01/2012  Kimberly Morgan 11/29/2012, 9:59 AM

## 2012-11-29 NOTE — Pre-Procedure Instructions (Signed)
Dr Jayme Cloud aware of potassium of 5.2. istat ordered am of surgery per his order.

## 2012-11-30 ENCOUNTER — Ambulatory Visit (INDEPENDENT_AMBULATORY_CARE_PROVIDER_SITE_OTHER): Payer: BC Managed Care – PPO

## 2012-11-30 DIAGNOSIS — O09299 Supervision of pregnancy with other poor reproductive or obstetric history, unspecified trimester: Secondary | ICD-10-CM

## 2012-11-30 NOTE — Progress Notes (Signed)
LIMITED STUDY, CX CHECK, TODAYS SCAN REVEALS CERVICAL LENGTH @ 3.71 CM W/ FUNDAL PRESSURE, FHR@ 144 BPM.

## 2012-12-01 ENCOUNTER — Encounter (HOSPITAL_COMMUNITY)
Admission: RE | Disposition: A | Discharge status: Home / Self Care | Payer: Self-pay | Source: Ambulatory Visit | Attending: Obstetrics and Gynecology

## 2012-12-01 ENCOUNTER — Encounter (HOSPITAL_COMMUNITY): Payer: Self-pay | Admitting: *Deleted

## 2012-12-01 ENCOUNTER — Ambulatory Visit (HOSPITAL_COMMUNITY)
Admission: RE | Admit: 2012-12-01 | Discharge: 2012-12-01 | Disposition: A | Discharge status: Home / Self Care | Payer: BC Managed Care – PPO | Source: Ambulatory Visit | Attending: Obstetrics and Gynecology | Admitting: Obstetrics and Gynecology

## 2012-12-01 ENCOUNTER — Encounter (HOSPITAL_COMMUNITY): Payer: Self-pay | Admitting: Anesthesiology

## 2012-12-01 ENCOUNTER — Ambulatory Visit (HOSPITAL_COMMUNITY): Payer: BC Managed Care – PPO

## 2012-12-01 ENCOUNTER — Ambulatory Visit (HOSPITAL_COMMUNITY): Payer: BC Managed Care – PPO | Admitting: Anesthesiology

## 2012-12-01 DIAGNOSIS — O09219 Supervision of pregnancy with history of pre-term labor, unspecified trimester: Secondary | ICD-10-CM | POA: Insufficient documentation

## 2012-12-01 DIAGNOSIS — N883 Incompetence of cervix uteri: Secondary | ICD-10-CM

## 2012-12-01 DIAGNOSIS — E039 Hypothyroidism, unspecified: Secondary | ICD-10-CM | POA: Insufficient documentation

## 2012-12-01 DIAGNOSIS — O99891 Other specified diseases and conditions complicating pregnancy: Secondary | ICD-10-CM | POA: Insufficient documentation

## 2012-12-01 DIAGNOSIS — O3430 Maternal care for cervical incompetence, unspecified trimester: Secondary | ICD-10-CM | POA: Insufficient documentation

## 2012-12-01 DIAGNOSIS — J301 Allergic rhinitis due to pollen: Secondary | ICD-10-CM | POA: Insufficient documentation

## 2012-12-01 DIAGNOSIS — E079 Disorder of thyroid, unspecified: Secondary | ICD-10-CM | POA: Insufficient documentation

## 2012-12-01 HISTORY — PX: CERVICAL CERCLAGE: SHX1329

## 2012-12-01 SURGERY — CERCLAGE, CERVIX, VAGINAL APPROACH
Anesthesia: Spinal | Site: Vagina | Wound class: Clean Contaminated

## 2012-12-01 MED ORDER — MIDAZOLAM HCL 2 MG/2ML IJ SOLN
INTRAMUSCULAR | Status: AC
  Filled 2012-12-01: qty 2

## 2012-12-01 MED ORDER — FENTANYL CITRATE 0.05 MG/ML IJ SOLN
25.0000 ug | INTRAMUSCULAR | Status: AC | PRN
  Administered 2012-12-01: 25 ug via INTRAVENOUS
  Administered 2012-12-01: 50 ug via INTRAVENOUS
  Administered 2012-12-01: 25 ug via INTRAVENOUS

## 2012-12-01 MED ORDER — LIDOCAINE IN DEXTROSE 5-7.5 % IV SOLN
INTRAVENOUS | Status: DC | PRN
  Administered 2012-12-01: 75 mg via INTRATHECAL

## 2012-12-01 MED ORDER — LACTATED RINGERS IV SOLN
INTRAVENOUS | Status: DC
  Administered 2012-12-01: 07:00:00 via INTRAVENOUS

## 2012-12-01 MED ORDER — 0.9 % SODIUM CHLORIDE (POUR BTL) OPTIME
TOPICAL | Status: DC | PRN
  Administered 2012-12-01: 1000 mL

## 2012-12-01 MED ORDER — ONDANSETRON HCL 4 MG/2ML IJ SOLN: 4.0000 mg | Freq: Once | INTRAMUSCULAR | Status: DC | PRN

## 2012-12-01 MED ORDER — FENTANYL CITRATE 0.05 MG/ML IJ SOLN: 25.0000 ug | INTRAMUSCULAR | Status: DC | PRN

## 2012-12-01 MED ORDER — FENTANYL CITRATE 0.05 MG/ML IJ SOLN
INTRAMUSCULAR | Status: AC
  Filled 2012-12-01: qty 2

## 2012-12-01 MED ORDER — ONDANSETRON HCL 4 MG/2ML IJ SOLN
INTRAMUSCULAR | Status: AC
  Filled 2012-12-01: qty 2

## 2012-12-01 MED ORDER — ONDANSETRON HCL 4 MG/2ML IJ SOLN
4.0000 mg | Freq: Once | INTRAMUSCULAR | Status: AC
  Administered 2012-12-01: 4 mg via INTRAVENOUS

## 2012-12-01 MED ORDER — CEFAZOLIN SODIUM-DEXTROSE 2-3 GM-% IV SOLR
INTRAVENOUS | Status: AC
  Filled 2012-12-01: qty 50

## 2012-12-01 MED ORDER — DEXAMETHASONE SODIUM PHOSPHATE 4 MG/ML IJ SOLN
4.0000 mg | Freq: Once | INTRAMUSCULAR | Status: AC
  Administered 2012-12-01: 4 mg via INTRAVENOUS

## 2012-12-01 MED ORDER — CEFAZOLIN SODIUM-DEXTROSE 2-3 GM-% IV SOLR
2.0000 g | INTRAVENOUS | Status: AC
  Administered 2012-12-01: 2 g via INTRAVENOUS

## 2012-12-01 MED ORDER — MIDAZOLAM HCL 5 MG/5ML IJ SOLN
INTRAMUSCULAR | Status: DC | PRN
  Administered 2012-12-01 (×2): 1 mg via INTRAVENOUS

## 2012-12-01 MED ORDER — MIDAZOLAM HCL 2 MG/2ML IJ SOLN
1.0000 mg | INTRAMUSCULAR | Status: DC | PRN
  Administered 2012-12-01: 2 mg via INTRAVENOUS

## 2012-12-01 MED ORDER — DEXAMETHASONE SODIUM PHOSPHATE 4 MG/ML IJ SOLN
INTRAMUSCULAR | Status: AC
  Filled 2012-12-01: qty 1

## 2012-12-01 MED ORDER — SURGILUBE EX GEL
CUTANEOUS | Status: DC | PRN
  Administered 2012-12-01: 1 via TOPICAL

## 2012-12-01 SURGICAL SUPPLY — 23 items
BAG HAMPER (MISCELLANEOUS) ×2 IMPLANT
CATH ROBINSON RED A/P 16FR (CATHETERS) ×1 IMPLANT
CLOTH BEACON ORANGE TIMEOUT ST (SAFETY) ×2 IMPLANT
COVER LIGHT HANDLE STERIS (MISCELLANEOUS) ×4 IMPLANT
DRAPE PROXIMA HALF (DRAPES) ×2 IMPLANT
GLOVE BIOGEL PI IND STRL 7.0 (GLOVE) IMPLANT
GLOVE BIOGEL PI IND STRL 7.5 (GLOVE) IMPLANT
GLOVE BIOGEL PI INDICATOR 7.0 (GLOVE) ×1
GLOVE BIOGEL PI INDICATOR 7.5 (GLOVE) ×1
GLOVE ECLIPSE 7.0 STRL STRAW (GLOVE) ×2 IMPLANT
GLOVE ECLIPSE 9.0 STRL (GLOVE) ×2 IMPLANT
GLOVE EXAM NITRILE LRG STRL (GLOVE) ×1 IMPLANT
GLOVE INDICATOR STER SZ 9 (GLOVE) ×2 IMPLANT
GOWN STRL REIN 3XL LVL4 (GOWN DISPOSABLE) ×2 IMPLANT
GOWN STRL REIN XL XLG (GOWN DISPOSABLE) ×3 IMPLANT
KIT ROOM TURNOVER AP CYSTO (KITS) ×2 IMPLANT
LUBRICANT JELLY 4.5OZ STERILE (MISCELLANEOUS) ×1 IMPLANT
MANIFOLD NEPTUNE II (INSTRUMENTS) ×2 IMPLANT
NS IRRIG 1000ML POUR BTL (IV SOLUTION) ×2 IMPLANT
PACK PERI GYN (CUSTOM PROCEDURE TRAY) ×2 IMPLANT
PAD ARMBOARD 7.5X6 YLW CONV (MISCELLANEOUS) ×2 IMPLANT
SPONGE GAUZE 4X4 12PLY (GAUZE/BANDAGES/DRESSINGS) ×1 IMPLANT
SUT PROLENE 0 CT 1 30 (SUTURE) ×6 IMPLANT

## 2012-12-01 NOTE — Anesthesia Procedure Notes (Signed)
Spinal  Patient location during procedure: OR Start time: 12/01/2012 7:49 AM Staffing CRNA/Resident: Glynn Octave E Preanesthetic Checklist Completed: patient identified, surgical consent, pre-op evaluation, timeout performed, IV checked, risks and benefits discussed and monitors and equipment checked Spinal Block Patient position: sitting Prep: Betadine Patient monitoring: heart rate, cardiac monitor, continuous pulse ox and blood pressure Approach: midline Location: L3-4 Injection technique: single-shot Needle Needle type: Pencan  Needle gauge: 24 G Needle length: 9 cm Assessment Sensory level: T10 Additional Notes Lot # [id number] Exp.  04/2013

## 2012-12-01 NOTE — Brief Op Note (Signed)
12/01/2012  8:32 AM  PATIENT:  Kimberly Morgan  32 y.o. female  PRE-OPERATIVE DIAGNOSIS:  cervical incompetency, history of Pregnancy 13 weeks 6 days POST-OPERATIVE DIAGNOSIS:  cervical incompetency, history of Pregnancy 13 weeks 6 days  PROCEDURE:  Procedure(s): MCDONALDS CERCLAGE CERVICAL (N/A), single stitch  SURGEON:  Surgeon(s) and Role:    * [name], MD - Primary  PHYSICIAN ASSISTANT:   ASSISTANTS: [place] CST, Company secretary   ANESTHESIA:   spinal  EBL:  Total I/O In: 700 [I.V.:700] Out: 50 [Urine:50]  BLOOD ADMINISTERED:none  DRAINS: none   LOCAL MEDICATIONS USED:  NONE  SPECIMEN:  No Specimen  DISPOSITION OF SPECIMEN:  N/A  COUNTS:  YES  TOURNIQUET:  * No tourniquets in log *  DICTATION: .Dragon Dictation  PLAN OF CARE: Discharge to home after PACU  PATIENT DISPOSITION:  PACU - hemodynamically stable.   Delay start of Pharmacological VTE agent (>24hrs) due to surgical blood loss or risk of bleeding: not applicable

## 2012-12-01 NOTE — Progress Notes (Signed)
Dr. Emelda Fear notified of difficulty finding FHR.  Dr.  Emelda Fear to bedside, portable US ordered and performed.  Pt tol well.  FHR 140. Will cont to monitor.

## 2012-12-01 NOTE — Interval H&P Note (Signed)
History and Physical Interval Note:  12/01/2012 7:07 AM  Kimberly Morgan  has presented today for surgery, with the diagnosis of cervical incompetency  The various methods of treatment have been discussed with the patient and family. After consideration of risks, benefits and other options for treatment, the patient has consented to  Procedure(s): MCDONALDS CERCLAGE CERVICAL (N/A) as a surgical intervention .  The patient's history has been reviewed, patient examined, no change in status, stable for surgery.  I have reviewed the patient's chart and labs.  Questions were answered to the patient's satisfaction.   The patient had a followup ultrasound in the office yesterday, which confirmed fetal heart activity, normal anatomy for gestational age, with cervical length 3.7 cm without funnelling.  Cerclage reviewed with patient, with one or two stitch cerclage planned , depending on clinical access to the cervix at surgical procedure.   Kimberly Morgan

## 2012-12-01 NOTE — Progress Notes (Signed)
FHR 144 prior to surgery procedure. FHR found lower right suprapubic area.

## 2012-12-01 NOTE — Transfer of Care (Signed)
Immediate Anesthesia Transfer of Care Note  Patient: Kimberly Morgan  Procedure(s) Performed: Procedure(s): MCDONALDS CERCLAGE CERVICAL (N/A)  Patient Location: PACU  Anesthesia Type:Spinal  Level of Consciousness: awake, alert  and oriented  Airway & Oxygen Therapy: Patient Spontanous Breathing and Patient connected to nasal cannula oxygen  Post-op Assessment: Report given to PACU RN  Post vital signs: Reviewed and stable  Complications: No apparent anesthesia complications

## 2012-12-01 NOTE — Anesthesia Preprocedure Evaluation (Signed)
Anesthesia Evaluation  Patient identified by MRN, date of birth, ID band Patient awake    Reviewed: Allergy & Precautions, H&P , NPO status , Patient's Chart, lab work & pertinent test results  History of Anesthesia Complications Negative for: history of anesthetic complications  Airway Mallampati: I TM Distance: >3 FB     Dental  (+) Teeth Intact   Pulmonary neg pulmonary ROS, Current Smoker,  breath sounds clear to auscultation        Cardiovascular negative cardio ROS  Rhythm:Regular     Neuro/Psych    GI/Hepatic   Endo/Other  Hypothyroidism   Renal/GU      Musculoskeletal   Abdominal   Peds  Hematology   Anesthesia Other Findings   Reproductive/Obstetrics                           Anesthesia Physical Anesthesia Plan  ASA: II  Anesthesia Plan: Spinal   Post-op Pain Management:    Induction:   Airway Management Planned: Nasal Cannula  Additional Equipment:   Intra-op Plan:   Post-operative Plan:   Informed Consent: I have reviewed the patients History and Physical, chart, labs and discussed the procedure including the risks, benefits and alternatives for the proposed anesthesia with the patient or authorized representative who has indicated his/her understanding and acceptance.     Plan Discussed with:   Anesthesia Plan Comments: (Egg allergy w/ anaphylaxis, plan to avoid propofol.)        Anesthesia Quick Evaluation

## 2012-12-01 NOTE — H&P (View-Only) (Signed)
Kimberly Morgan is an 32 y.o. female. She is admitted for placement of a McDonald cerclage due to history of cervical incompetence with her last pregnancy resulting in pregnancy loss at 21-[redacted] weeks gestation after rescue cerclage placed just before diagnosis of pregnancy loss. Current pregnancy is a result of ICSI through Dr. Deaton, an ultrasound last week shows a  normally developing pregnancy with fetal heart tones documented, and cervical length measuring 4.1 cm last week. The patient has been made aware of the risk of cerclage,, the slight risk of membrane rupture associated with cerclage placement potential for bleeding, cervical irritation etc. Cerclage was recommended both by me and Dr. Deaton.  Pertinent Gynecological History: Menses: Pregnant LMP 02/23/2012, EDC 06/02/2012 based on implantation date by ICSI Bleeding: None this pregnancy Contraception: Pregnant DES exposure: unknown Blood transfusions: none Sexually transmitted diseases: no past history Previous GYN Procedures: Prior McDonald cerclage placed with last pregnancy upon diagnosis of cervical incompetency and [redacted] weeks gestation  Last mammogram:  Date:  Last pap:  Date:  OB History: G2, P0100   Menstrual History: Menarche age:  Patient's last menstrual period was 02/23/2012.    Past Medical History  Diagnosis Date  . Hypothyroidism   . Seasonal allergies   . UTI (lower urinary tract infection)   . Kidney stones   . Preterm labor   . 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 Hypothyroid 10/31/2012    Past Surgical History  Procedure Laterality Date  . Cholecystectomy      laparoscopic- 2011  . Cervical cerclage  09/30/2011    Procedure: CERCLAGE CERVICAL;  Surgeon: Tawny Raspberry Morgan Amelio Brosky, MD;  Location: [facility];  Service: Gynecology;  Laterality: N/A;    Family History  Problem Relation Age of Onset  . Coronary artery disease Mother   . Hypertension Sister   . Cancer Maternal Grandfather     lung  . Diabetes Paternal  Grandfather     Social History:  reports that she quit smoking about 16 months ago. Her smoking use included Cigarettes. She has a 2.5 pack-year smoking history. She has never used smokeless tobacco. She reports that she does not drink alcohol or use illicit drugs.  Allergies:  Allergies include aspirin and egg products  (Not in a hospital admission)  ROS  Last menstrual period 02/23/2012. Physical Exam Physical Examination: General appearance - alert, well appearing, and in no distress, oriented to person, place, and time, normal appearing weight and anxious Mental status - alert, oriented to person, place, and time, normal mood, behavior, speech, dress, motor activity, and thought processes Heart - normal rate and regular rhythm Abdomen - soft, nontender, nondistended, no masses or organomegaly Pelvic - VULVA: normal appearing vulva with no masses, tenderness or lesions, VAGINA: normal appearing vagina with normal color and discharge, no lesions, CERVIX: normal appearing cervix without discharge or lesions, cervix is relatively short, at least the portion which protrudes into the vagina and ultrasound shows that there is good cervical length above the visible cervix, UTERUS: uterus is normal size, shape, consistency and nontender, enlarged to 13-14 week's size Extremities - peripheral pulses normal, no pedal edema, no clubbing or cyanosis   No results found for this or any previous visit (from the past 24 hour(s)).  No results found.  Assessment/Plan: Pregnancy 13 weeks 6 days blood type O+ History of cervical incompetence with prior pregnancy Pregnancy the result of ICSI per Dr. Deaton Plan: McDonald cerclage placement through day surgery 12/01/2012  Kimberly Morgan 11/29/2012, 9:59 AM  

## 2012-12-01 NOTE — Op Note (Signed)
PATIENT:  Yeraldy Coralie Keens  32 y.o. female  PRE-OPERATIVE DIAGNOSIS:  cervical incompetency, history of Pregnancy 13 weeks 6 days POST-OPERATIVE DIAGNOSIS:  cervical incompetency, history of Pregnancy 13 weeks 6 days  PROCEDURE:  Procedure(s): MCDONALDS CERCLAGE CERVICAL (N/A), single stitch  SURGEON:  Surgeon(s) and Role:    * [name], MD - Primary Details of procedure: Patient was taken operating room she, after fetal hearts documented in the preop area and consents obtained. Spinal anesthesia was introduced, the anesthesia achieved placed in high lithotomy leg support timeout conducted, and procedure confirmed by surgical team. Prepping and draping was performed, a speculum inserted and the cervix grasped on its external margins with ring forceps. A single stitch #1 Prolene stitch was placed in a clockwise circumferential fashion at the level of the cervicovaginal edge, placed in a pursestring fashion, and then tied at 12:00 , tied down sufficiently that ring forceps met slight resistance at the internal os. Sutures were cut quite long for future access there was no suspicion of complications bleeding was minimal less than 10 cc, sponge and needle counts were correct and patient sent to recovery room in good condition, where heartbeat and the baby was confirmed at 120 beats per minute Blood type has been confirmed as type O, Rh+ so no RhoGAM is required

## 2012-12-01 NOTE — Anesthesia Postprocedure Evaluation (Addendum)
  Anesthesia Post-op Note  Patient: Kimberly Morgan  Procedure(s) Performed: Procedure(s): MCDONALDS CERCLAGE CERVICAL (N/A)  Patient Location: PACU  Anesthesia Type:Spinal  Level of Consciousness: awake, alert  and oriented  Airway and Oxygen Therapy: Patient Spontanous Breathing  Post-op Pain: none  Post-op Assessment: Post-op Vital signs reviewed, Patient's Cardiovascular Status Stable, Respiratory Function Stable, Patent Airway and No signs of Nausea or vomiting  Post-op Vital Signs: Reviewed and stable, 36.5  Complications: No apparent anesthesia complications

## 2012-12-05 ENCOUNTER — Encounter (HOSPITAL_COMMUNITY): Payer: Self-pay | Admitting: Obstetrics and Gynecology

## 2012-12-06 ENCOUNTER — Ambulatory Visit (INDEPENDENT_AMBULATORY_CARE_PROVIDER_SITE_OTHER): Payer: BC Managed Care – PPO | Admitting: Obstetrics and Gynecology

## 2012-12-06 VITALS — BP 128/84 | Wt 208.2 lb

## 2012-12-06 DIAGNOSIS — O0992 Supervision of high risk pregnancy, unspecified, second trimester: Secondary | ICD-10-CM

## 2012-12-06 DIAGNOSIS — O09819 Supervision of pregnancy resulting from assisted reproductive technology, unspecified trimester: Secondary | ICD-10-CM

## 2012-12-06 DIAGNOSIS — Z331 Pregnant state, incidental: Secondary | ICD-10-CM

## 2012-12-06 DIAGNOSIS — O09299 Supervision of pregnancy with other poor reproductive or obstetric history, unspecified trimester: Secondary | ICD-10-CM

## 2012-12-06 DIAGNOSIS — Z1389 Encounter for screening for other disorder: Secondary | ICD-10-CM

## 2012-12-06 DIAGNOSIS — O99280 Endocrine, nutritional and metabolic diseases complicating pregnancy, unspecified trimester: Secondary | ICD-10-CM

## 2012-12-06 LAB — POCT URINALYSIS DIPSTICK
Glucose, UA: NEGATIVE
Ketones, UA: NEGATIVE
Leukocytes, UA: NEGATIVE
Nitrite, UA: NEGATIVE

## 2012-12-06 NOTE — Progress Notes (Signed)
Small amount of vaginal bleeding after cervical cerclage on 12/01/2012. Still having "some mid-abdominal pressure."

## 2012-12-06 NOTE — Progress Notes (Signed)
Cervix visually closed, no erythema, no significant discharge. Ultrasound shows a closed cx, 4.5 cm length, well above the cerclage, no funnelling. Plan. Ck cx length q 2wk til 24 wk, begin 17 P shots at 16+ WK.  REck 2 wk and begin 17-P jvf'

## 2012-12-14 ENCOUNTER — Encounter: Payer: BC Managed Care – PPO | Admitting: Obstetrics & Gynecology

## 2012-12-20 ENCOUNTER — Ambulatory Visit (INDEPENDENT_AMBULATORY_CARE_PROVIDER_SITE_OTHER): Payer: BC Managed Care – PPO | Admitting: Obstetrics and Gynecology

## 2012-12-20 VITALS — BP 130/60 | Wt 212.0 lb

## 2012-12-20 DIAGNOSIS — O09299 Supervision of pregnancy with other poor reproductive or obstetric history, unspecified trimester: Secondary | ICD-10-CM

## 2012-12-20 DIAGNOSIS — O09819 Supervision of pregnancy resulting from assisted reproductive technology, unspecified trimester: Secondary | ICD-10-CM

## 2012-12-20 DIAGNOSIS — Z1389 Encounter for screening for other disorder: Secondary | ICD-10-CM

## 2012-12-20 DIAGNOSIS — O99280 Endocrine, nutritional and metabolic diseases complicating pregnancy, unspecified trimester: Secondary | ICD-10-CM

## 2012-12-20 DIAGNOSIS — Z8751 Personal history of pre-term labor: Secondary | ICD-10-CM

## 2012-12-20 DIAGNOSIS — E079 Disorder of thyroid, unspecified: Secondary | ICD-10-CM

## 2012-12-20 DIAGNOSIS — Z331 Pregnant state, incidental: Secondary | ICD-10-CM

## 2012-12-20 DIAGNOSIS — O09219 Supervision of pregnancy with history of pre-term labor, unspecified trimester: Secondary | ICD-10-CM

## 2012-12-20 MED ORDER — HYDROXYPROGESTERONE CAPROATE 250 MG/ML IM OIL
250.0000 mg | TOPICAL_OIL | Freq: Once | INTRAMUSCULAR | Status: AC
  Administered 2012-12-20: 250 mg via INTRAMUSCULAR

## 2012-12-20 NOTE — Progress Notes (Signed)
C/o lower pelvic soreness and breast milk production. 17 P today.

## 2012-12-20 NOTE — Progress Notes (Signed)
No c/o. Plan: weekly 17P, every other wk cx length . Will check  cx length next wk

## 2012-12-22 ENCOUNTER — Other Ambulatory Visit: Payer: Self-pay | Admitting: Obstetrics and Gynecology

## 2012-12-22 DIAGNOSIS — O3432 Maternal care for cervical incompetence, second trimester: Secondary | ICD-10-CM

## 2012-12-27 ENCOUNTER — Ambulatory Visit (INDEPENDENT_AMBULATORY_CARE_PROVIDER_SITE_OTHER): Payer: BC Managed Care – PPO

## 2012-12-27 ENCOUNTER — Telehealth: Payer: Self-pay | Admitting: Obstetrics and Gynecology

## 2012-12-27 ENCOUNTER — Ambulatory Visit: Payer: BC Managed Care – PPO

## 2012-12-27 ENCOUNTER — Other Ambulatory Visit: Payer: Self-pay | Admitting: Obstetrics and Gynecology

## 2012-12-27 ENCOUNTER — Ambulatory Visit (INDEPENDENT_AMBULATORY_CARE_PROVIDER_SITE_OTHER): Payer: BC Managed Care – PPO | Admitting: Obstetrics and Gynecology

## 2012-12-27 VITALS — BP 130/70 | Wt 213.2 lb

## 2012-12-27 DIAGNOSIS — O09299 Supervision of pregnancy with other poor reproductive or obstetric history, unspecified trimester: Secondary | ICD-10-CM

## 2012-12-27 DIAGNOSIS — O3432 Maternal care for cervical incompetence, second trimester: Secondary | ICD-10-CM

## 2012-12-27 DIAGNOSIS — O09219 Supervision of pregnancy with history of pre-term labor, unspecified trimester: Secondary | ICD-10-CM

## 2012-12-27 DIAGNOSIS — O3430 Maternal care for cervical incompetence, unspecified trimester: Secondary | ICD-10-CM

## 2012-12-27 DIAGNOSIS — E079 Disorder of thyroid, unspecified: Secondary | ICD-10-CM

## 2012-12-27 DIAGNOSIS — O09819 Supervision of pregnancy resulting from assisted reproductive technology, unspecified trimester: Secondary | ICD-10-CM

## 2012-12-27 DIAGNOSIS — Z8751 Personal history of pre-term labor: Secondary | ICD-10-CM

## 2012-12-27 DIAGNOSIS — E039 Hypothyroidism, unspecified: Secondary | ICD-10-CM

## 2012-12-27 DIAGNOSIS — Z331 Pregnant state, incidental: Secondary | ICD-10-CM

## 2012-12-27 DIAGNOSIS — Z1389 Encounter for screening for other disorder: Secondary | ICD-10-CM

## 2012-12-27 LAB — POCT URINALYSIS DIPSTICK
Glucose, UA: NEGATIVE
Glucose, UA: NEGATIVE
Nitrite, UA: NEGATIVE
Protein, UA: NEGATIVE

## 2012-12-27 MED ORDER — HYDROXYPROGESTERONE CAPROATE 250 MG/ML IM OIL
250.0000 mg | TOPICAL_OIL | Freq: Once | INTRAMUSCULAR | Status: AC
  Administered 2012-12-27: 250 mg via INTRAMUSCULAR

## 2012-12-27 NOTE — Patient Instructions (Signed)
Check tsh today 

## 2012-12-27 NOTE — Progress Notes (Signed)
"  a lot pelvic pressure and pain extends to left leg" 17 P given.

## 2012-12-27 NOTE — Telephone Encounter (Signed)
Larita Fife, lab tech, called and spoke with pt husband concerning labs.

## 2012-12-27 NOTE — Progress Notes (Signed)
U/S(17+4wks)-active fetus, BPD c/w dates, cx=4.0 cm long and closed no funneling noted and stitch is noted. FHR=132bpm, complete anatomy survey in 2-3 wks

## 2012-12-28 LAB — TSH: TSH: 1.011 u[IU]/mL (ref 0.350–4.500)

## 2012-12-28 NOTE — Progress Notes (Signed)
TSH =1.01 stay on current synthroid dosing jvf

## 2012-12-31 LAB — MATERNAL SCREEN, INTEGRATED #2
AFP MoM: 0.49
AFP, Serum: 18.5 ng/mL
Crown Rump Length: 62.6 mm
Estriol, Free: 0.95 ng/mL
MSS Down Syndrome: 1:660 {titer}
Nuchal Translucency: 1.52 mm
Number of fetuses: 1
PAPP-A MoM: 0.58
PAPP-A: 354 ng/mL
hCG, Serum: 24.1 IU/mL

## 2013-01-01 ENCOUNTER — Other Ambulatory Visit: Payer: Self-pay | Admitting: Adult Health

## 2013-01-02 ENCOUNTER — Other Ambulatory Visit: Payer: Self-pay | Admitting: Adult Health

## 2013-01-03 ENCOUNTER — Encounter: Payer: BC Managed Care – PPO | Admitting: Obstetrics and Gynecology

## 2013-01-03 ENCOUNTER — Ambulatory Visit (INDEPENDENT_AMBULATORY_CARE_PROVIDER_SITE_OTHER): Payer: BC Managed Care – PPO | Admitting: Adult Health

## 2013-01-03 ENCOUNTER — Encounter: Payer: Self-pay | Admitting: Adult Health

## 2013-01-03 VITALS — BP 120/74 | Ht 63.0 in | Wt 214.0 lb

## 2013-01-03 DIAGNOSIS — O09219 Supervision of pregnancy with history of pre-term labor, unspecified trimester: Secondary | ICD-10-CM

## 2013-01-03 DIAGNOSIS — Z331 Pregnant state, incidental: Secondary | ICD-10-CM

## 2013-01-03 DIAGNOSIS — Z1389 Encounter for screening for other disorder: Secondary | ICD-10-CM

## 2013-01-03 LAB — POCT URINALYSIS DIPSTICK
Glucose, UA: NEGATIVE
Leukocytes, UA: NEGATIVE
Protein, UA: NEGATIVE

## 2013-01-03 MED ORDER — HYDROXYPROGESTERONE CAPROATE 250 MG/ML IM OIL
250.0000 mg | TOPICAL_OIL | Freq: Once | INTRAMUSCULAR | Status: AC
  Administered 2013-01-03: 250 mg via INTRAMUSCULAR

## 2013-01-03 NOTE — Progress Notes (Signed)
Patient ID: Kimberly Morgan, female   DOB: Aug 28, 1980, 32 y.o.   MRN: [id number] Pt here today for 17P injection only. Pt denies any problems or concerns at this time. Pt denies any bleeding or swelling.

## 2013-01-05 ENCOUNTER — Encounter: Payer: Self-pay | Admitting: Obstetrics and Gynecology

## 2013-01-05 DIAGNOSIS — O0990 Supervision of high risk pregnancy, unspecified, unspecified trimester: Secondary | ICD-10-CM | POA: Insufficient documentation

## 2013-01-10 ENCOUNTER — Ambulatory Visit (INDEPENDENT_AMBULATORY_CARE_PROVIDER_SITE_OTHER): Payer: BC Managed Care – PPO | Admitting: Adult Health

## 2013-01-10 ENCOUNTER — Encounter: Payer: Self-pay | Admitting: Adult Health

## 2013-01-10 VITALS — BP 118/76 | Ht 63.0 in | Wt 218.0 lb

## 2013-01-10 DIAGNOSIS — O09219 Supervision of pregnancy with history of pre-term labor, unspecified trimester: Secondary | ICD-10-CM

## 2013-01-10 DIAGNOSIS — Z3492 Encounter for supervision of normal pregnancy, unspecified, second trimester: Secondary | ICD-10-CM

## 2013-01-10 DIAGNOSIS — Z1389 Encounter for screening for other disorder: Secondary | ICD-10-CM

## 2013-01-10 LAB — POCT URINALYSIS DIPSTICK
Leukocytes, UA: NEGATIVE
Nitrite, UA: NEGATIVE
Protein, UA: NEGATIVE

## 2013-01-10 MED ORDER — HYDROXYPROGESTERONE CAPROATE 250 MG/ML IM OIL
250.0000 mg | TOPICAL_OIL | Freq: Once | INTRAMUSCULAR | Status: AC
  Administered 2013-01-10: 250 mg via INTRAMUSCULAR

## 2013-01-10 NOTE — Progress Notes (Signed)
Patient ID: Kimberly Morgan, female   DOB: Sep 21, 1980, 32 y.o.   MRN: [id number] Pt here today for 17P injection. Pt denies any swelling or bleeding. Pt denies any problems or concerns at this time. Pt states she has good baby movement.

## 2013-01-12 ENCOUNTER — Other Ambulatory Visit: Payer: Self-pay | Admitting: Obstetrics & Gynecology

## 2013-01-12 DIAGNOSIS — Z1389 Encounter for screening for other disorder: Secondary | ICD-10-CM

## 2013-01-12 DIAGNOSIS — O3432 Maternal care for cervical incompetence, second trimester: Secondary | ICD-10-CM

## 2013-01-12 DIAGNOSIS — O09299 Supervision of pregnancy with other poor reproductive or obstetric history, unspecified trimester: Secondary | ICD-10-CM

## 2013-01-16 ENCOUNTER — Ambulatory Visit (INDEPENDENT_AMBULATORY_CARE_PROVIDER_SITE_OTHER): Payer: BC Managed Care – PPO | Admitting: Obstetrics & Gynecology

## 2013-01-16 ENCOUNTER — Encounter: Payer: Self-pay | Admitting: Obstetrics & Gynecology

## 2013-01-16 ENCOUNTER — Ambulatory Visit (INDEPENDENT_AMBULATORY_CARE_PROVIDER_SITE_OTHER): Payer: BC Managed Care – PPO

## 2013-01-16 VITALS — BP 120/60 | Wt 210.0 lb

## 2013-01-16 DIAGNOSIS — N883 Incompetence of cervix uteri: Secondary | ICD-10-CM

## 2013-01-16 DIAGNOSIS — O09299 Supervision of pregnancy with other poor reproductive or obstetric history, unspecified trimester: Secondary | ICD-10-CM

## 2013-01-16 DIAGNOSIS — O3430 Maternal care for cervical incompetence, unspecified trimester: Secondary | ICD-10-CM

## 2013-01-16 DIAGNOSIS — Z1389 Encounter for screening for other disorder: Secondary | ICD-10-CM

## 2013-01-16 DIAGNOSIS — O3432 Maternal care for cervical incompetence, second trimester: Secondary | ICD-10-CM

## 2013-01-16 DIAGNOSIS — Z363 Encounter for antenatal screening for malformations: Secondary | ICD-10-CM

## 2013-01-16 LAB — POCT URINALYSIS DIPSTICK
Blood, UA: NEGATIVE
Nitrite, UA: NEGATIVE
Protein, UA: NEGATIVE

## 2013-01-16 NOTE — Progress Notes (Signed)
Had U/S Today. Concern husband came down with shingles.

## 2013-01-16 NOTE — Patient Instructions (Signed)
Pregnancy - Second Trimester The second trimester of pregnancy (3 to 6 months) is a period of rapid growth for you and your baby. At the end of the sixth month, your baby is about 9 inches long and weighs 1 1/2 pounds. You will begin to feel the baby move between 18 and 20 weeks of the pregnancy. This is called quickening. Weight gain is faster. A clear fluid (colostrum) may leak out of your breasts. You may feel small contractions of the womb (uterus). This is known as false labor or Braxton-Hicks contractions. This is like a practice for labor when the baby is ready to be born. Usually, the problems with morning sickness have usually passed by the end of your first trimester. Some women develop small dark blotches (called cholasma, mask of pregnancy) on their face that usually goes away after the baby is born. Exposure to the sun makes the blotches worse. Acne may also develop in some pregnant women and pregnant women who have acne, may find that it goes away. PRENATAL EXAMS  Blood work may continue to be done during prenatal exams. These tests are done to check on your health and the probable health of your baby. Blood work is used to follow your blood levels (hemoglobin). Anemia (low hemoglobin) is common during pregnancy. Iron and vitamins are given to help prevent this. You will also be checked for diabetes between 24 and 28 weeks of the pregnancy. Some of the previous blood tests may be repeated.  The size of the uterus is measured during each visit. This is to make sure that the baby is continuing to grow properly according to the dates of the pregnancy.  Your blood pressure is checked every prenatal visit. This is to make sure you are not getting toxemia.  Your urine is checked to make sure you do not have an infection, diabetes or protein in the urine.  Your weight is checked often to make sure gains are happening at the suggested rate. This is to ensure that both you and your baby are  growing normally.  Sometimes, an ultrasound is performed to confirm the proper growth and development of the baby. This is a test which bounces harmless sound waves off the baby so your caregiver can more accurately determine due dates. Sometimes, a test is done on the amniotic fluid surrounding the baby. This test is called an amniocentesis. The amniotic fluid is obtained by sticking a needle into the belly (abdomen). This is done to check the chromosomes in instances where there is a concern about possible genetic problems with the baby. It is also sometimes done near the end of pregnancy if an early delivery is required. In this case, it is done to help make sure the baby's lungs are mature enough for the baby to live outside of the womb. CHANGES OCCURING IN THE SECOND TRIMESTER OF PREGNANCY Your body goes through many changes during pregnancy. They vary from person to person. Talk to your caregiver about changes you notice that you are concerned about.  During the second trimester, you will likely have an increase in your appetite. It is normal to have cravings for certain foods. This varies from person to person and pregnancy to pregnancy.  Your lower abdomen will begin to bulge.  You may have to urinate more often because the uterus and baby are pressing on your bladder. It is also common to get more bladder infections during pregnancy. You can help this by drinking lots of fluids   and emptying your bladder before and after intercourse.  You may begin to get stretch marks on your hips, abdomen, and breasts. These are normal changes in the body during pregnancy. There are no exercises or medicines to take that prevent this change.  You may begin to develop swollen and bulging veins (varicose veins) in your legs. Wearing support hose, elevating your feet for 15 minutes, 3 to 4 times a day and limiting salt in your diet helps lessen the problem.  Heartburn may develop as the uterus grows and  pushes up against the stomach. Antacids recommended by your caregiver helps with this problem. Also, eating smaller meals 4 to 5 times a day helps.  Constipation can be treated with a stool softener or adding bulk to your diet. Drinking lots of fluids, and eating vegetables, fruits, and whole grains are helpful.  Exercising is also helpful. If you have been very active up until your pregnancy, most of these activities can be continued during your pregnancy. If you have been less active, it is helpful to start an exercise program such as walking.  Hemorrhoids may develop at the end of the second trimester. Warm sitz baths and hemorrhoid cream recommended by your caregiver helps hemorrhoid problems.  Backaches may develop during this time of your pregnancy. Avoid heavy lifting, wear low heal shoes, and practice good posture to help with backache problems.  Some pregnant women develop tingling and numbness of their hand and fingers because of swelling and tightening of ligaments in the wrist (carpel tunnel syndrome). This goes away after the baby is born.  As your breasts enlarge, you may have to get a bigger bra. Get a comfortable, cotton, support bra. Do not get a nursing bra until the last month of the pregnancy if you will be nursing the baby.  You may get a dark line from your belly button to the pubic area called the linea nigra.  You may develop rosy cheeks because of increase blood flow to the face.  You may develop spider looking lines of the face, neck, arms, and chest. These go away after the baby is born. HOME CARE INSTRUCTIONS   It is extremely important to avoid all smoking, herbs, alcohol, and unprescribed drugs during your pregnancy. These chemicals affect the formation and growth of the baby. Avoid these chemicals throughout the pregnancy to ensure the delivery of a healthy infant.  Most of your home care instructions are the same as suggested for the first trimester of your  pregnancy. Keep your caregiver's appointments. Follow your caregiver's instructions regarding medicine use, exercise, and diet.  During pregnancy, you are providing food for you and your baby. Continue to eat regular, well-balanced meals. Choose foods such as meat, fish, milk and other low fat dairy products, vegetables, fruits, and whole-grain breads and cereals. Your caregiver will tell you of the ideal weight gain.  A physical sexual relationship may be continued up until near the end of pregnancy if there are no other problems. Problems could include early (premature) leaking of amniotic fluid from the membranes, vaginal bleeding, abdominal pain, or other medical or pregnancy problems.  Exercise regularly if there are no restrictions. Check with your caregiver if you are unsure of the safety of some of your exercises. The greatest weight gain will occur in the last 2 trimesters of pregnancy. Exercise will help you:  Control your weight.  Get you in shape for labor and delivery.  Lose weight after you have the baby.  Wear   a good support or jogging bra for breast tenderness during pregnancy. This may help if worn during sleep. Pads or tissues may be used in the bra if you are leaking colostrum.  Do not use hot tubs, steam rooms or saunas throughout the pregnancy.  Wear your seat belt at all times when driving. This protects you and your baby if you are in an accident.  Avoid raw meat, uncooked cheese, cat litter boxes, and soil used by cats. These carry germs that can cause birth defects in the baby.  The second trimester is also a good time to visit your dentist for your dental health if this has not been done yet. Getting your teeth cleaned is okay. Use a soft toothbrush. Brush gently during pregnancy.  It is easier to leak urine during pregnancy. Tightening up and strengthening the pelvic muscles will help with this problem. Practice stopping your urination while you are going to the  bathroom. These are the same muscles you need to strengthen. It is also the muscles you would use as if you were trying to stop from passing gas. You can practice tightening these muscles up 10 times a set and repeating this about 3 times per day. Once you know what muscles to tighten up, do not perform these exercises during urination. It is more likely to contribute to an infection by backing up the urine.  Ask for help if you have financial, counseling, or nutritional needs during pregnancy. Your caregiver will be able to offer counseling for these needs as well as refer you for other special needs.  Your skin may become oily. If so, wash your face with mild soap, use non-greasy moisturizer and oil or cream based makeup. MEDICINES AND DRUG USE IN PREGNANCY  Take prenatal vitamins as directed. The vitamin should contain 1 milligram of folic acid. Keep all vitamins out of reach of children. Only a couple vitamins or tablets containing iron may be fatal to a baby or young child when ingested.  Avoid use of all medicines, including herbs, over-the-counter medicines, not prescribed or suggested by your caregiver. Only take over-the-counter or prescription medicines for pain, discomfort, or fever as directed by your caregiver. Do not use aspirin.  Let your caregiver also know about herbs you may be using.  Alcohol is related to a number of birth defects. This includes fetal alcohol syndrome. All alcohol, in any form, should be avoided completely. Smoking will cause low birth rate and premature babies.  Street or illegal drugs are very harmful to the baby. They are absolutely forbidden. A baby born to an addicted mother will be addicted at birth. The baby will go through the same withdrawal an adult does. SEEK MEDICAL CARE IF:  You have any concerns or worries during your pregnancy. It is better to call with your questions if you feel they cannot wait, rather than worry about them. SEEK IMMEDIATE  MEDICAL CARE IF:   An unexplained oral temperature above 102 F (38.9 C) develops, or as your caregiver suggests.  You have leaking of fluid from the vagina (birth canal). If leaking membranes are suspected, take your temperature and tell your caregiver of this when you call.  There is vaginal spotting, bleeding, or passing clots. Tell your caregiver of the amount and how many pads are used. Light spotting in pregnancy is common, especially following intercourse.  You develop a bad smelling vaginal discharge with a change in the color from clear to white.  You continue to feel   sick to your stomach (nauseated) and have no relief from remedies suggested. You vomit blood or coffee ground-like materials.  You lose more than 2 pounds of weight or gain more than 2 pounds of weight over 1 week, or as suggested by your caregiver.  You notice swelling of your face, hands, feet, or legs.  You get exposed to German measles and have never had them.  You are exposed to fifth disease or chickenpox.  You develop belly (abdominal) pain. Round ligament discomfort is a common non-cancerous (benign) cause of abdominal pain in pregnancy. Your caregiver still must evaluate you.  You develop a bad headache that does not go away.  You develop fever, diarrhea, pain with urination, or shortness of breath.  You develop visual problems, blurry, or double vision.  You fall or are in a car accident or any kind of trauma.  There is mental or physical violence at home. Document Released: 05/05/2001 Document Revised: 02/03/2012 Document Reviewed: 11/07/2008 [employer] Patient Information 2014 [employer].  

## 2013-01-16 NOTE — Progress Notes (Signed)
Anatomy screen complete, all anat appears nml, meas. C/w dates, active female fetus, breech lie, FHT 144/bpm, fluid nml, high post. Plac., gr 0, cx closed 4.0 cm, , bilat ovs seen

## 2013-01-16 NOTE — Progress Notes (Signed)
BP weight and urine results all reviewed and noted. Patient reports good fetal movement, denies any bleeding and no rupture of membranes symptoms or regular contractions. Patient is without complaints. All questions were answered.  

## 2013-01-16 NOTE — Addendum Note (Signed)
Addended by: Richardson Chiquito on: 01/16/2013 05:38 PM   Modules accepted: Orders

## 2013-01-18 ENCOUNTER — Encounter: Payer: Self-pay | Admitting: Adult Health

## 2013-01-18 ENCOUNTER — Ambulatory Visit (INDEPENDENT_AMBULATORY_CARE_PROVIDER_SITE_OTHER): Payer: BC Managed Care – PPO | Admitting: Adult Health

## 2013-01-18 VITALS — BP 118/78 | Wt 215.0 lb

## 2013-01-18 DIAGNOSIS — O09219 Supervision of pregnancy with history of pre-term labor, unspecified trimester: Secondary | ICD-10-CM

## 2013-01-18 DIAGNOSIS — Z1389 Encounter for screening for other disorder: Secondary | ICD-10-CM

## 2013-01-18 DIAGNOSIS — Z331 Pregnant state, incidental: Secondary | ICD-10-CM

## 2013-01-18 DIAGNOSIS — Z8751 Personal history of pre-term labor: Secondary | ICD-10-CM

## 2013-01-18 DIAGNOSIS — Z3490 Encounter for supervision of normal pregnancy, unspecified, unspecified trimester: Secondary | ICD-10-CM

## 2013-01-18 LAB — POCT URINALYSIS DIPSTICK
Blood, UA: NEGATIVE
Glucose, UA: NEGATIVE
Protein, UA: NEGATIVE

## 2013-01-18 MED ORDER — HYDROXYPROGESTERONE CAPROATE 250 MG/ML IM OIL
250.0000 mg | TOPICAL_OIL | Freq: Once | INTRAMUSCULAR | Status: AC
  Administered 2013-01-18: 250 mg via INTRAMUSCULAR

## 2013-01-19 ENCOUNTER — Telehealth: Payer: Self-pay | Admitting: *Deleted

## 2013-01-19 ENCOUNTER — Encounter: Payer: Self-pay | Admitting: Advanced Practice Midwife

## 2013-01-19 ENCOUNTER — Ambulatory Visit (INDEPENDENT_AMBULATORY_CARE_PROVIDER_SITE_OTHER): Payer: BC Managed Care – PPO | Admitting: Advanced Practice Midwife

## 2013-01-19 VITALS — BP 132/60 | Wt 215.0 lb

## 2013-01-19 DIAGNOSIS — Z1389 Encounter for screening for other disorder: Secondary | ICD-10-CM

## 2013-01-19 DIAGNOSIS — O2390 Unspecified genitourinary tract infection in pregnancy, unspecified trimester: Secondary | ICD-10-CM

## 2013-01-19 DIAGNOSIS — Z331 Pregnant state, incidental: Secondary | ICD-10-CM

## 2013-01-19 LAB — POCT URINALYSIS DIPSTICK: Protein, UA: NEGATIVE

## 2013-01-19 NOTE — Telephone Encounter (Signed)
Pt states having a "clear thick discharge in bulks"  + FM, no vaginal bleeding. Offered pt to come in now, states unable to due to work. Pt to be here today to be evaluated at 1:30.

## 2013-01-19 NOTE — Progress Notes (Signed)
C/O clear mucousy discharge several times today.  Denies cramping/bleeding (cramped once or twice this am, none since.)  No intercourse in 3 weeks.  Cx length 3 days ago 4.0 cms.  SSE:  Normal appearing discharge, no mucous.  TVUS by Dr. Despina Hidden, cx >4.0cms.  Baby active.

## 2013-01-19 NOTE — Progress Notes (Signed)
Cramps this am. Clear, mucus discharge this am.

## 2013-01-26 ENCOUNTER — Ambulatory Visit (INDEPENDENT_AMBULATORY_CARE_PROVIDER_SITE_OTHER): Payer: BC Managed Care – PPO | Admitting: Advanced Practice Midwife

## 2013-01-26 ENCOUNTER — Encounter: Payer: Self-pay | Admitting: Advanced Practice Midwife

## 2013-01-26 VITALS — BP 128/78 | Wt 218.5 lb

## 2013-01-26 DIAGNOSIS — O09219 Supervision of pregnancy with history of pre-term labor, unspecified trimester: Secondary | ICD-10-CM

## 2013-01-26 DIAGNOSIS — O09299 Supervision of pregnancy with other poor reproductive or obstetric history, unspecified trimester: Secondary | ICD-10-CM

## 2013-01-26 DIAGNOSIS — Z1389 Encounter for screening for other disorder: Secondary | ICD-10-CM

## 2013-01-26 DIAGNOSIS — O3430 Maternal care for cervical incompetence, unspecified trimester: Secondary | ICD-10-CM

## 2013-01-26 DIAGNOSIS — Z331 Pregnant state, incidental: Secondary | ICD-10-CM

## 2013-01-26 LAB — POCT URINALYSIS DIPSTICK: Leukocytes, UA: NEGATIVE

## 2013-01-26 MED ORDER — HYDROXYPROGESTERONE CAPROATE 250 MG/ML IM OIL
250.0000 mg | TOPICAL_OIL | Freq: Once | INTRAMUSCULAR | Status: AC
  Administered 2013-01-26: 250 mg via INTRAMUSCULAR

## 2013-01-26 NOTE — Progress Notes (Signed)
Pain in pelvic area. Hard to lift left leg. Symphysis diastasis.  Tips given.  Had 17 today.    No c/o at this time.  Routine questions about pregnancy answered.  F/U in 1 weeks for 17p.

## 2013-02-01 ENCOUNTER — Encounter: Payer: Self-pay | Admitting: Adult Health

## 2013-02-01 ENCOUNTER — Ambulatory Visit (INDEPENDENT_AMBULATORY_CARE_PROVIDER_SITE_OTHER): Payer: BC Managed Care – PPO | Admitting: Adult Health

## 2013-02-01 VITALS — BP 110/64 | Wt 220.0 lb

## 2013-02-01 DIAGNOSIS — Z331 Pregnant state, incidental: Secondary | ICD-10-CM

## 2013-02-01 DIAGNOSIS — Z8751 Personal history of pre-term labor: Secondary | ICD-10-CM

## 2013-02-01 DIAGNOSIS — O09219 Supervision of pregnancy with history of pre-term labor, unspecified trimester: Secondary | ICD-10-CM

## 2013-02-01 DIAGNOSIS — Z1389 Encounter for screening for other disorder: Secondary | ICD-10-CM

## 2013-02-01 DIAGNOSIS — Z3490 Encounter for supervision of normal pregnancy, unspecified, unspecified trimester: Secondary | ICD-10-CM

## 2013-02-01 MED ORDER — HYDROXYPROGESTERONE CAPROATE 250 MG/ML IM OIL
250.0000 mg | TOPICAL_OIL | Freq: Once | INTRAMUSCULAR | Status: AC
  Administered 2013-02-01: 250 mg via INTRAMUSCULAR

## 2013-02-04 ENCOUNTER — Inpatient Hospital Stay (HOSPITAL_COMMUNITY)
Admission: AD | Admit: 2013-02-04 | Discharge: 2013-02-04 | Disposition: A | Discharge status: Home / Self Care | Payer: BC Managed Care – PPO | Source: Ambulatory Visit | Attending: Obstetrics & Gynecology | Admitting: Obstetrics & Gynecology

## 2013-02-04 ENCOUNTER — Encounter (HOSPITAL_COMMUNITY): Payer: Self-pay | Admitting: *Deleted

## 2013-02-04 ENCOUNTER — Inpatient Hospital Stay (HOSPITAL_COMMUNITY): Payer: BC Managed Care – PPO

## 2013-02-04 DIAGNOSIS — O3432 Maternal care for cervical incompetence, second trimester: Secondary | ICD-10-CM

## 2013-02-04 DIAGNOSIS — O0990 Supervision of high risk pregnancy, unspecified, unspecified trimester: Secondary | ICD-10-CM | POA: Insufficient documentation

## 2013-02-04 DIAGNOSIS — O0992 Supervision of high risk pregnancy, unspecified, second trimester: Secondary | ICD-10-CM

## 2013-02-04 DIAGNOSIS — N949 Unspecified condition associated with female genital organs and menstrual cycle: Secondary | ICD-10-CM | POA: Insufficient documentation

## 2013-02-04 DIAGNOSIS — E039 Hypothyroidism, unspecified: Secondary | ICD-10-CM | POA: Insufficient documentation

## 2013-02-04 DIAGNOSIS — E079 Disorder of thyroid, unspecified: Secondary | ICD-10-CM | POA: Insufficient documentation

## 2013-02-04 DIAGNOSIS — O3430 Maternal care for cervical incompetence, unspecified trimester: Secondary | ICD-10-CM | POA: Insufficient documentation

## 2013-02-04 DIAGNOSIS — O4700 False labor before 37 completed weeks of gestation, unspecified trimester: Secondary | ICD-10-CM

## 2013-02-04 LAB — URINALYSIS, ROUTINE W REFLEX MICROSCOPIC
Glucose, UA: NEGATIVE mg/dL
Hgb urine dipstick: NEGATIVE
Protein, ur: NEGATIVE mg/dL
Specific Gravity, Urine: 1.025 (ref 1.005–1.030)
pH: 6.5 (ref 5.0–8.0)

## 2013-02-04 LAB — WET PREP, GENITAL
Clue Cells Wet Prep HPF POC: NONE SEEN
Trich, Wet Prep: NONE SEEN
Yeast Wet Prep HPF POC: NONE SEEN

## 2013-02-04 NOTE — MAU Provider Note (Signed)
Obstetric Attending MAU Note  Chief Complaint:  Vaginal Pain   HPI: Kimberly Morgan is a 32 y.o. G2P0100 at [redacted]w[redacted]d [facility] patient who presents to maternity admissions reporting increased pelvic pressure.  She has a history of a [redacted]w[redacted]d week delivery and had a cerclage placed this pregnancy. She also has received weekly 17P injections since 16 weeks.  Last surveillance ultrasound cervical length check on  01/21/13 showed the cervix measuring 4.0 cm, closed no funneling noted.   Patient is concerned that she is having the same symptoms she had when she "lost the baby". Denies contractions, leakage of fluid or vaginal bleeding. Good fetal movement.   Pregnancy Course: Receives care at [facility] Patient Active Problem List   Diagnosis Date Noted  . Hypothyroidism in pregnancy, antepartum 02/04/2013  . Supervision of high-risk pregnancy 01/05/2013  . Pregnancy resulting from in vitro fertilization 11/22/2012  . Cervical incompetence, antepartum 10/31/2012  . Hypothyroid 10/31/2012    Past Medical History  Diagnosis Date  . Hypothyroidism   . Seasonal allergies   . UTI (lower urinary tract infection)   . Kidney stones   . Preterm labor   . 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 Hypothyroid 10/31/2012  . 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 Cholecystectomy      laparoscopic- 2011  . Cervical cerclage  09/30/2011    Procedure: CERCLAGE CERVICAL;  Surgeon: [name], MD;  Location: [facility];  Service: Gynecology;  Laterality: N/A;  . Cervical  cerclage N/A 12/01/2012    Procedure: [facility] CERCLAGE CERVICAL;  Surgeon: [name], MD;  Location: [facility];  Service: Gynecology;  Laterality: N/A;    Family History  Problem Relation Age of Onset  . Coronary artery disease Mother   . Hypertension Sister   . Cancer Maternal Grandfather     lung  . Diabetes Paternal Grandfather     Social History  Substance Use Topics  . Smoking status: Former Smoker -- 0.25 packs/day for 10 years    Types: Cigarettes    Quit date: 07/24/2011  . Smokeless tobacco: Never Used  . Alcohol Use: No    Allergies  Allergen Reactions  . Aspirin Anaphylaxis  . Eggs Or Egg-Derived Products Anaphylaxis    Duck eggs only, can eat chicken eggs  . Bactrim [Sulfamethoxazole W-Trimethoprim] Nausea And Vomiting  . Baker's Yeast [Yeast] Hives and Other (See Comments)    Reaction: extreme all over body yeast infections.    No prescriptions prior to admission    ROS: Pertinent findings in history of present illness.  Physical Exam  Blood pressure 120/60, pulse 88, temperature 98.3 F (36.8 C), resp. rate 20, height 5\' 3"  (1.6 m), weight 216 lb 12.8 oz (98.34 kg), last menstrual period 02/23/2012. GENERAL: Well-developed, well-nourished female in no acute distress.  HEENT: Normocephalic, atraumatic HEART: Regular rate and regular RESP:  Normal effort, no breathing difficult ABDOMEN: Soft, non-tender, gravid appropriate for gestational age EXTREMITIES: Nontender, no edema NEURO: Alert and oriented SPECULUM EXAM: NEFG, physiologic discharge, no blood, cervix clean, cerclage in place, cervix visually closed. Digital exam also revealed closed cervix, long and posterior.   FHT:  Baseline 135 , moderate variability, 10 x 10 accelerations present, no decelerations Contractions: none   Labs: Results for orders placed during the hospital encounter of 02/04/13 (from the past 24 hour(s))  URINALYSIS, ROUTINE W REFLEX MICROSCOPIC     Status: Abnormal    Collection Time    02/04/13  7:30 PM      Result Value Range   Color, Urine YELLOW  YELLOW   APPearance CLEAR  CLEAR   Specific Gravity, Urine 1.025  1.005 - 1.030   pH 6.5  5.0 - 8.0   Glucose, UA NEGATIVE  NEGATIVE mg/dL   Hgb urine dipstick NEGATIVE  NEGATIVE   Bilirubin Urine NEGATIVE  NEGATIVE   Ketones, ur 15 (*) NEGATIVE mg/dL   Protein, ur NEGATIVE  NEGATIVE mg/dL   Urobilinogen, UA 0.2  0.0 - 1.0 mg/dL   Nitrite NEGATIVE  NEGATIVE   Leukocytes, UA NEGATIVE  NEGATIVE  WET PREP, GENITAL     Status: Abnormal   Collection Time    02/04/13  8:10 PM      Result Value Range   Yeast Wet Prep HPF POC NONE SEEN  NONE SEEN   Trich, Wet Prep NONE SEEN  NONE SEEN   Clue Cells Wet Prep HPF POC NONE SEEN  NONE SEEN   WBC, Wet Prep HPF POC FEW (*) NONE SEEN    Imaging:  02/04/2013 OB TV US Dynamic cervix; shortest length of 2.1 cm with funneling of internal os;  funnel length 0.9 cm, width 1 cm. This shortening happens during Valsalva.   MAU Course: 2000  UA neg, wet prep and GC/Chlam cultures pending. Transvaginal ultrasound ordered for cervical length 2045 Reviewed results of ultrasound with patient.  She is already on 17P, no need for vaginal progesterone. She will follow up at [facility]  Assessment: 1. Cervical incompetence, antepartum, second trimester   2. Supervision of high-risk pregnancy, second trimester     Plan: Discharge home on continued pelvic rest and modified bed rest Preterm labor precautions and fetal kick counts reviewed Follow up with [facility]  Follow-up Information   Follow up with [facility]. (As scheduled)    Contact information:   [address] [place] [address] [phone]      Tereso Newcomer, MD 02/04/2013 9:43 PM

## 2013-02-04 NOTE — MAU Note (Signed)
Last night started having pain and pressure in cervix. Has continued throughout the day. 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 Have hx 22.6wk vag del. Baby lived one hour. Feeling the same as did then when things started happening. Denies bleeding or d/c

## 2013-02-05 LAB — GC/CHLAMYDIA PROBE AMP
CT Probe RNA: NEGATIVE
GC Probe RNA: NEGATIVE

## 2013-02-06 ENCOUNTER — Ambulatory Visit (INDEPENDENT_AMBULATORY_CARE_PROVIDER_SITE_OTHER): Payer: Self-pay | Admitting: Obstetrics & Gynecology

## 2013-02-06 ENCOUNTER — Encounter: Payer: Self-pay | Admitting: Obstetrics & Gynecology

## 2013-02-06 ENCOUNTER — Telehealth: Payer: Self-pay | Admitting: *Deleted

## 2013-02-06 VITALS — BP 120/70 | Wt 217.0 lb

## 2013-02-06 DIAGNOSIS — O09299 Supervision of pregnancy with other poor reproductive or obstetric history, unspecified trimester: Secondary | ICD-10-CM

## 2013-02-06 DIAGNOSIS — O3430 Maternal care for cervical incompetence, unspecified trimester: Secondary | ICD-10-CM

## 2013-02-06 DIAGNOSIS — Z1389 Encounter for screening for other disorder: Secondary | ICD-10-CM

## 2013-02-06 DIAGNOSIS — Z331 Pregnant state, incidental: Secondary | ICD-10-CM

## 2013-02-06 DIAGNOSIS — N883 Incompetence of cervix uteri: Secondary | ICD-10-CM

## 2013-02-06 DIAGNOSIS — O09219 Supervision of pregnancy with history of pre-term labor, unspecified trimester: Secondary | ICD-10-CM

## 2013-02-06 LAB — POCT URINALYSIS DIPSTICK
Blood, UA: NEGATIVE
Glucose, UA: NEGATIVE
Leukocytes, UA: NEGATIVE
Nitrite, UA: NEGATIVE
Protein, UA: NEGATIVE

## 2013-02-06 MED ORDER — PROGESTERONE MICRONIZED 200 MG PO CAPS: ORAL_CAPSULE | ORAL | Status: DC

## 2013-02-06 NOTE — Progress Notes (Signed)
Went to [facility] Saturday.C/C PELVIC PAIN AND PRESSURE.

## 2013-02-06 NOTE — Telephone Encounter (Signed)
Pt states seen at [facility] on Saturday for pain. Pt states was told to follow up here today due to short cervics and bed rest. Pt told to be here today at 2:45 pm to see Dr. Despina Hidden.

## 2013-02-06 NOTE — Progress Notes (Signed)
Kimberly Morgan had a visit to the [facility] U. this we can which resulted in a vaginal sonogram and cervical evaluation. I have reviewed with the patient and her husband and he understand there are some funneling changes but no central funneling which is still very positive  I'm going to place her on Prometrium 200 mg nightly in addition her 17 P. basically throwing the progesterone kitchen sink at her  We will recheck her cervical length next week

## 2013-02-08 ENCOUNTER — Ambulatory Visit (INDEPENDENT_AMBULATORY_CARE_PROVIDER_SITE_OTHER): Payer: BC Managed Care – PPO | Admitting: Advanced Practice Midwife

## 2013-02-08 ENCOUNTER — Encounter: Payer: Self-pay | Admitting: Advanced Practice Midwife

## 2013-02-08 VITALS — BP 110/78 | Ht 63.0 in | Wt 219.5 lb

## 2013-02-08 DIAGNOSIS — Z331 Pregnant state, incidental: Secondary | ICD-10-CM

## 2013-02-08 DIAGNOSIS — E039 Hypothyroidism, unspecified: Secondary | ICD-10-CM

## 2013-02-08 DIAGNOSIS — O3430 Maternal care for cervical incompetence, unspecified trimester: Secondary | ICD-10-CM

## 2013-02-08 DIAGNOSIS — O0990 Supervision of high risk pregnancy, unspecified, unspecified trimester: Secondary | ICD-10-CM

## 2013-02-08 DIAGNOSIS — Z1389 Encounter for screening for other disorder: Secondary | ICD-10-CM

## 2013-02-08 DIAGNOSIS — O09819 Supervision of pregnancy resulting from assisted reproductive technology, unspecified trimester: Secondary | ICD-10-CM

## 2013-02-08 DIAGNOSIS — O09219 Supervision of pregnancy with history of pre-term labor, unspecified trimester: Secondary | ICD-10-CM

## 2013-02-08 LAB — POCT URINALYSIS DIPSTICK
Ketones, UA: NEGATIVE
Protein, UA: NEGATIVE

## 2013-02-08 MED ORDER — HYDROXYPROGESTERONE CAPROATE 250 MG/ML IM OIL
250.0000 mg | TOPICAL_OIL | Freq: Once | INTRAMUSCULAR | Status: AC
  Administered 2013-02-08: 250 mg via INTRAMUSCULAR

## 2013-02-13 ENCOUNTER — Ambulatory Visit (INDEPENDENT_AMBULATORY_CARE_PROVIDER_SITE_OTHER): Payer: Self-pay | Admitting: Obstetrics and Gynecology

## 2013-02-13 ENCOUNTER — Other Ambulatory Visit: Payer: Self-pay | Admitting: Obstetrics & Gynecology

## 2013-02-13 ENCOUNTER — Ambulatory Visit (INDEPENDENT_AMBULATORY_CARE_PROVIDER_SITE_OTHER): Payer: BC Managed Care – PPO

## 2013-02-13 VITALS — BP 128/70 | Wt 217.0 lb

## 2013-02-13 DIAGNOSIS — O09219 Supervision of pregnancy with history of pre-term labor, unspecified trimester: Secondary | ICD-10-CM

## 2013-02-13 DIAGNOSIS — Z1389 Encounter for screening for other disorder: Secondary | ICD-10-CM

## 2013-02-13 DIAGNOSIS — N883 Incompetence of cervix uteri: Secondary | ICD-10-CM

## 2013-02-13 DIAGNOSIS — O09299 Supervision of pregnancy with other poor reproductive or obstetric history, unspecified trimester: Secondary | ICD-10-CM

## 2013-02-13 DIAGNOSIS — O3430 Maternal care for cervical incompetence, unspecified trimester: Secondary | ICD-10-CM

## 2013-02-13 DIAGNOSIS — Z331 Pregnant state, incidental: Secondary | ICD-10-CM

## 2013-02-13 LAB — POCT URINALYSIS DIPSTICK
Blood, UA: NEGATIVE
Glucose, UA: NEGATIVE
Ketones, UA: NEGATIVE
Leukocytes, UA: NEGATIVE

## 2013-02-13 NOTE — Progress Notes (Signed)
No complaints at this time. Ultrasound today. 

## 2013-02-13 NOTE — Patient Instructions (Addendum)
Keep up weekly visits with 17/4 abd exams every other week

## 2013-02-13 NOTE — Progress Notes (Signed)
U/S(24+3wks)-vtx active fetus,approp growth EFW 1 lb 9 oz(47th%tile), fluid wnl, Gr 1 plac, Cx=3.4cm no funneling noted(measures vaginally) no change with fundal pressure applied

## 2013-02-13 NOTE — Progress Notes (Signed)
U/s reviewed, good cervical length. Will continue 17-P and vaginal progesterone as was started last week. jvf

## 2013-02-15 ENCOUNTER — Ambulatory Visit (INDEPENDENT_AMBULATORY_CARE_PROVIDER_SITE_OTHER): Payer: BC Managed Care – PPO | Admitting: Adult Health

## 2013-02-15 ENCOUNTER — Encounter: Payer: Self-pay | Admitting: Adult Health

## 2013-02-15 VITALS — BP 122/64 | Wt 221.0 lb

## 2013-02-15 DIAGNOSIS — Z8751 Personal history of pre-term labor: Secondary | ICD-10-CM

## 2013-02-15 DIAGNOSIS — Z331 Pregnant state, incidental: Secondary | ICD-10-CM

## 2013-02-15 DIAGNOSIS — O09819 Supervision of pregnancy resulting from assisted reproductive technology, unspecified trimester: Secondary | ICD-10-CM

## 2013-02-15 DIAGNOSIS — O99280 Endocrine, nutritional and metabolic diseases complicating pregnancy, unspecified trimester: Secondary | ICD-10-CM

## 2013-02-15 DIAGNOSIS — O0990 Supervision of high risk pregnancy, unspecified, unspecified trimester: Secondary | ICD-10-CM

## 2013-02-15 DIAGNOSIS — Z1389 Encounter for screening for other disorder: Secondary | ICD-10-CM

## 2013-02-15 DIAGNOSIS — E039 Hypothyroidism, unspecified: Secondary | ICD-10-CM

## 2013-02-15 DIAGNOSIS — O3430 Maternal care for cervical incompetence, unspecified trimester: Secondary | ICD-10-CM

## 2013-02-15 DIAGNOSIS — O09219 Supervision of pregnancy with history of pre-term labor, unspecified trimester: Secondary | ICD-10-CM

## 2013-02-15 LAB — POCT URINALYSIS DIPSTICK
Glucose, UA: NEGATIVE
Nitrite, UA: NEGATIVE

## 2013-02-15 MED ORDER — HYDROXYPROGESTERONE CAPROATE 250 MG/ML IM OIL
250.0000 mg | TOPICAL_OIL | Freq: Once | INTRAMUSCULAR | Status: AC
  Administered 2013-02-15: 250 mg via INTRAMUSCULAR

## 2013-02-22 ENCOUNTER — Other Ambulatory Visit: Payer: Self-pay | Admitting: Obstetrics & Gynecology

## 2013-02-22 ENCOUNTER — Ambulatory Visit (INDEPENDENT_AMBULATORY_CARE_PROVIDER_SITE_OTHER): Payer: BC Managed Care – PPO | Admitting: Adult Health

## 2013-02-22 ENCOUNTER — Ambulatory Visit (INDEPENDENT_AMBULATORY_CARE_PROVIDER_SITE_OTHER): Payer: BC Managed Care – PPO

## 2013-02-22 ENCOUNTER — Other Ambulatory Visit: Payer: BC Managed Care – PPO

## 2013-02-22 VITALS — BP 124/76 | Wt 219.0 lb

## 2013-02-22 DIAGNOSIS — Z1389 Encounter for screening for other disorder: Secondary | ICD-10-CM

## 2013-02-22 DIAGNOSIS — Z331 Pregnant state, incidental: Secondary | ICD-10-CM

## 2013-02-22 DIAGNOSIS — O09219 Supervision of pregnancy with history of pre-term labor, unspecified trimester: Secondary | ICD-10-CM

## 2013-02-22 DIAGNOSIS — N898 Other specified noninflammatory disorders of vagina: Secondary | ICD-10-CM

## 2013-02-22 DIAGNOSIS — O09299 Supervision of pregnancy with other poor reproductive or obstetric history, unspecified trimester: Secondary | ICD-10-CM

## 2013-02-22 DIAGNOSIS — Z8751 Personal history of pre-term labor: Secondary | ICD-10-CM

## 2013-02-22 DIAGNOSIS — O3432 Maternal care for cervical incompetence, second trimester: Secondary | ICD-10-CM

## 2013-02-22 DIAGNOSIS — O3430 Maternal care for cervical incompetence, unspecified trimester: Secondary | ICD-10-CM

## 2013-02-22 LAB — POCT URINALYSIS DIPSTICK
Glucose, UA: NEGATIVE
Nitrite, UA: NEGATIVE

## 2013-02-22 MED ORDER — HYDROXYPROGESTERONE CAPROATE 250 MG/ML IM OIL
250.0000 mg | TOPICAL_OIL | Freq: Once | INTRAMUSCULAR | Status: AC
  Administered 2013-02-22: 250 mg via INTRAMUSCULAR

## 2013-02-22 NOTE — Progress Notes (Signed)
U/S(25+5wks)-Vaginal u/s to check cx length due to vaginal discharge noted by pt., After vaginal probe was inserted to begin exam cx length measured 3.2cm and closed, while observing cervix it began to funnel, funnel was 1.64cm in length, 2.3cm at it's widest and cx measured 2.0cm at that time.  While continuing to observe and watch cervix length the funnel regressed. [id number] bpm

## 2013-02-22 NOTE — Progress Notes (Signed)
C/o contractions Saturday night 20 min thru out the night into morning. 17 P today,

## 2013-02-23 ENCOUNTER — Encounter: Payer: BC Managed Care – PPO | Admitting: Obstetrics and Gynecology

## 2013-02-28 ENCOUNTER — Other Ambulatory Visit: Payer: Self-pay | Admitting: Obstetrics and Gynecology

## 2013-02-28 DIAGNOSIS — O3432 Maternal care for cervical incompetence, second trimester: Secondary | ICD-10-CM

## 2013-03-01 ENCOUNTER — Encounter: Payer: Self-pay | Admitting: Adult Health

## 2013-03-01 ENCOUNTER — Ambulatory Visit (INDEPENDENT_AMBULATORY_CARE_PROVIDER_SITE_OTHER): Payer: BC Managed Care – PPO | Admitting: Adult Health

## 2013-03-01 ENCOUNTER — Encounter: Payer: BC Managed Care – PPO | Admitting: Advanced Practice Midwife

## 2013-03-01 VITALS — BP 132/64 | Ht 63.0 in | Wt 221.0 lb

## 2013-03-01 DIAGNOSIS — O09819 Supervision of pregnancy resulting from assisted reproductive technology, unspecified trimester: Secondary | ICD-10-CM

## 2013-03-01 DIAGNOSIS — Z3041 Encounter for surveillance of contraceptive pills: Secondary | ICD-10-CM

## 2013-03-01 DIAGNOSIS — Z331 Pregnant state, incidental: Secondary | ICD-10-CM

## 2013-03-01 DIAGNOSIS — E039 Hypothyroidism, unspecified: Secondary | ICD-10-CM

## 2013-03-01 DIAGNOSIS — Z1389 Encounter for screening for other disorder: Secondary | ICD-10-CM

## 2013-03-01 DIAGNOSIS — O0990 Supervision of high risk pregnancy, unspecified, unspecified trimester: Secondary | ICD-10-CM

## 2013-03-01 DIAGNOSIS — O3430 Maternal care for cervical incompetence, unspecified trimester: Secondary | ICD-10-CM

## 2013-03-01 LAB — POCT URINALYSIS DIPSTICK
Ketones, UA: NEGATIVE
Nitrite, UA: NEGATIVE

## 2013-03-08 ENCOUNTER — Ambulatory Visit: Payer: BC Managed Care – PPO | Admitting: Adult Health

## 2013-03-08 ENCOUNTER — Other Ambulatory Visit: Payer: Self-pay | Admitting: Obstetrics & Gynecology

## 2013-03-08 ENCOUNTER — Ambulatory Visit (INDEPENDENT_AMBULATORY_CARE_PROVIDER_SITE_OTHER): Payer: BC Managed Care – PPO

## 2013-03-08 ENCOUNTER — Encounter: Payer: Self-pay | Admitting: Obstetrics and Gynecology

## 2013-03-08 ENCOUNTER — Ambulatory Visit (INDEPENDENT_AMBULATORY_CARE_PROVIDER_SITE_OTHER): Payer: BC Managed Care – PPO | Admitting: Obstetrics and Gynecology

## 2013-03-08 DIAGNOSIS — O0993 Supervision of high risk pregnancy, unspecified, third trimester: Secondary | ICD-10-CM

## 2013-03-08 DIAGNOSIS — Z1389 Encounter for screening for other disorder: Secondary | ICD-10-CM

## 2013-03-08 DIAGNOSIS — O3432 Maternal care for cervical incompetence, second trimester: Secondary | ICD-10-CM

## 2013-03-08 DIAGNOSIS — O09219 Supervision of pregnancy with history of pre-term labor, unspecified trimester: Secondary | ICD-10-CM

## 2013-03-08 DIAGNOSIS — O09299 Supervision of pregnancy with other poor reproductive or obstetric history, unspecified trimester: Secondary | ICD-10-CM

## 2013-03-08 DIAGNOSIS — O3430 Maternal care for cervical incompetence, unspecified trimester: Secondary | ICD-10-CM

## 2013-03-08 LAB — CBC
MCHC: 34.8 g/dL (ref 30.0–36.0)
Platelets: 245 10*3/uL (ref 150–400)
RDW: 13.3 % (ref 11.5–15.5)
WBC: 14 10*3/uL — ABNORMAL HIGH (ref 4.0–10.5)

## 2013-03-08 LAB — POCT URINALYSIS DIPSTICK
Blood, UA: NEGATIVE
Glucose, UA: NEGATIVE
Nitrite, UA: NEGATIVE

## 2013-03-08 LAB — HIV ANTIBODY (ROUTINE TESTING W REFLEX): HIV: NONREACTIVE

## 2013-03-08 MED ORDER — HYDROXYPROGESTERONE CAPROATE 250 MG/ML IM OIL
250.0000 mg | TOPICAL_OIL | Freq: Once | INTRAMUSCULAR | Status: AC
  Administered 2013-03-08: 250 mg via INTRAMUSCULAR

## 2013-03-08 NOTE — Progress Notes (Signed)
FOR PN2 AND U/S TODAY.

## 2013-03-08 NOTE — Progress Notes (Signed)
U/S(27+5wks)-vtx active fetus, meas c/w dates, fluid wnl, post gr 0 plac, EFW 2 lb 5 oz (42nd%tile), female fetus, CX-2.0cm closed with a funnel noted while scanning live (no fundal pressure applied or valsalva performed) that reduced cx length to 1.5cm and the funnel was 1.4cm in width and 1.3cm in length (measured vaginally)

## 2013-03-08 NOTE — Progress Notes (Signed)
U/s shows continued funnelling with fundal pressure, with no significant progression of funnelling. NO change in pressure sx , discharge, and no bleeding. Plan: will delay betamethasone for now. Continue limited activity at home.     rechk 2wk

## 2013-03-09 LAB — ANTIBODY SCREEN: Antibody Screen: NEGATIVE

## 2013-03-09 LAB — GLUCOSE TOLERANCE, 2 HOURS W/ 1HR: Glucose, Fasting: 75 mg/dL (ref 70–99)

## 2013-03-10 LAB — HSV 2 ANTIBODY, IGG: HSV 2 Glycoprotein G Ab, IgG: <0.1 IV

## 2013-03-15 ENCOUNTER — Ambulatory Visit (INDEPENDENT_AMBULATORY_CARE_PROVIDER_SITE_OTHER): Payer: BC Managed Care – PPO | Admitting: Adult Health

## 2013-03-15 VITALS — BP 118/78 | Wt 220.0 lb

## 2013-03-15 DIAGNOSIS — E039 Hypothyroidism, unspecified: Secondary | ICD-10-CM

## 2013-03-15 DIAGNOSIS — Z331 Pregnant state, incidental: Secondary | ICD-10-CM

## 2013-03-15 DIAGNOSIS — Z1389 Encounter for screening for other disorder: Secondary | ICD-10-CM

## 2013-03-15 DIAGNOSIS — O09219 Supervision of pregnancy with history of pre-term labor, unspecified trimester: Secondary | ICD-10-CM

## 2013-03-15 DIAGNOSIS — O09819 Supervision of pregnancy resulting from assisted reproductive technology, unspecified trimester: Secondary | ICD-10-CM

## 2013-03-15 DIAGNOSIS — O3430 Maternal care for cervical incompetence, unspecified trimester: Secondary | ICD-10-CM

## 2013-03-15 DIAGNOSIS — O0993 Supervision of high risk pregnancy, unspecified, third trimester: Secondary | ICD-10-CM

## 2013-03-15 DIAGNOSIS — Z8751 Personal history of pre-term labor: Secondary | ICD-10-CM

## 2013-03-15 LAB — POCT URINALYSIS DIPSTICK
Glucose, UA: NEGATIVE
Ketones, UA: NEGATIVE

## 2013-03-15 MED ORDER — HYDROXYPROGESTERONE CAPROATE 250 MG/ML IM OIL
250.0000 mg | TOPICAL_OIL | Freq: Once | INTRAMUSCULAR | Status: AC
  Administered 2013-03-15: 250 mg via INTRAMUSCULAR

## 2013-03-15 NOTE — Progress Notes (Signed)
Patient ID: Kimberly Morgan, female   DOB: Sep 06, 1980, 32 y.o.   MRN: [id number] Pt here today for 17 P only, no complaints at this time.

## 2013-03-22 ENCOUNTER — Ambulatory Visit (INDEPENDENT_AMBULATORY_CARE_PROVIDER_SITE_OTHER): Payer: BC Managed Care – PPO | Admitting: Obstetrics and Gynecology

## 2013-03-22 ENCOUNTER — Encounter: Payer: Self-pay | Admitting: Obstetrics and Gynecology

## 2013-03-22 VITALS — BP 122/80 | Wt 219.5 lb

## 2013-03-22 DIAGNOSIS — Z1389 Encounter for screening for other disorder: Secondary | ICD-10-CM

## 2013-03-22 DIAGNOSIS — E039 Hypothyroidism, unspecified: Secondary | ICD-10-CM

## 2013-03-22 DIAGNOSIS — O3433 Maternal care for cervical incompetence, third trimester: Secondary | ICD-10-CM

## 2013-03-22 DIAGNOSIS — O3430 Maternal care for cervical incompetence, unspecified trimester: Secondary | ICD-10-CM

## 2013-03-22 DIAGNOSIS — O09219 Supervision of pregnancy with history of pre-term labor, unspecified trimester: Secondary | ICD-10-CM

## 2013-03-22 DIAGNOSIS — E079 Disorder of thyroid, unspecified: Secondary | ICD-10-CM

## 2013-03-22 LAB — POCT URINALYSIS DIPSTICK
Leukocytes, UA: NEGATIVE
Nitrite, UA: NEGATIVE
Protein, UA: NEGATIVE

## 2013-03-22 MED ORDER — HYDROXYPROGESTERONE CAPROATE 250 MG/ML IM OIL
250.0000 mg | TOPICAL_OIL | Freq: Once | INTRAMUSCULAR | Status: AC
  Administered 2013-03-22: 250 mg via INTRAMUSCULAR

## 2013-03-22 NOTE — Progress Notes (Signed)
U/S : 2.6 cm cervical length by u/s today ,, no funnelling at time of exam even with valsalva or abd pressure.  Cerclage stitch has migrated to the very tip of the soft cervix. No uterine contractions.  P: continue 17 P, weekly, will hold BMZ unless cervix changes noted. On Prometrium also.    Check TFT today.

## 2013-03-22 NOTE — Progress Notes (Signed)
Pt denies any problems or concerns. Wants to discuss pap, has some questions.

## 2013-03-22 NOTE — Patient Instructions (Signed)
Results of thyroid tests available end of week.

## 2013-03-23 LAB — TSH: TSH: 1.18 u[IU]/mL (ref 0.350–4.500)

## 2013-03-24 ENCOUNTER — Telehealth: Payer: Self-pay | Admitting: Obstetrics & Gynecology

## 2013-03-24 NOTE — Telephone Encounter (Signed)
Pt informed of TSH of 1.180, per Dr. Emelda Fear no change in Levothyroxine dosage. Pt verbalized understanding.

## 2013-03-29 ENCOUNTER — Encounter: Payer: Self-pay | Admitting: Adult Health

## 2013-03-29 ENCOUNTER — Ambulatory Visit (INDEPENDENT_AMBULATORY_CARE_PROVIDER_SITE_OTHER): Payer: BC Managed Care – PPO | Admitting: Adult Health

## 2013-03-29 VITALS — BP 130/78 | Wt 222.0 lb

## 2013-03-29 DIAGNOSIS — O09819 Supervision of pregnancy resulting from assisted reproductive technology, unspecified trimester: Secondary | ICD-10-CM

## 2013-03-29 DIAGNOSIS — Z331 Pregnant state, incidental: Secondary | ICD-10-CM

## 2013-03-29 DIAGNOSIS — Z1389 Encounter for screening for other disorder: Secondary | ICD-10-CM

## 2013-03-29 DIAGNOSIS — O09219 Supervision of pregnancy with history of pre-term labor, unspecified trimester: Secondary | ICD-10-CM

## 2013-03-29 DIAGNOSIS — E039 Hypothyroidism, unspecified: Secondary | ICD-10-CM

## 2013-03-29 DIAGNOSIS — O0993 Supervision of high risk pregnancy, unspecified, third trimester: Secondary | ICD-10-CM

## 2013-03-29 DIAGNOSIS — Z8751 Personal history of pre-term labor: Secondary | ICD-10-CM

## 2013-03-29 DIAGNOSIS — O3430 Maternal care for cervical incompetence, unspecified trimester: Secondary | ICD-10-CM

## 2013-03-29 LAB — POCT URINALYSIS DIPSTICK
Blood, UA: NEGATIVE
Nitrite, UA: NEGATIVE
Protein, UA: NEGATIVE

## 2013-03-29 MED ORDER — HYDROXYPROGESTERONE CAPROATE 250 MG/ML IM OIL
250.0000 mg | TOPICAL_OIL | Freq: Once | INTRAMUSCULAR | Status: AC
  Administered 2013-03-29: 250 mg via INTRAMUSCULAR

## 2013-03-29 NOTE — Progress Notes (Signed)
Patient ID: Kimberly Morgan, female   DOB: September 02, 1980, 32 y.o.   MRN: [id number] Pt here for 17 P injection only. Pt has no complaints at this time.

## 2013-03-31 ENCOUNTER — Telehealth: Payer: Self-pay | Admitting: Obstetrics & Gynecology

## 2013-03-31 NOTE — Telephone Encounter (Signed)
Spoke with pt. Woke up with sorethroat and stuffy nose. Advised plain Sudafed and Hall's cough drops or gargle with warm salt water. Pt has had no fever or wheezing. Advised if started with fever or wheezing, go to hospital. Pt voiced understanding. JSY

## 2013-04-05 ENCOUNTER — Ambulatory Visit (INDEPENDENT_AMBULATORY_CARE_PROVIDER_SITE_OTHER): Payer: BC Managed Care – PPO | Admitting: Obstetrics and Gynecology

## 2013-04-05 ENCOUNTER — Encounter: Payer: Self-pay | Admitting: Obstetrics and Gynecology

## 2013-04-05 VITALS — BP 120/72 | Wt 224.0 lb

## 2013-04-05 DIAGNOSIS — O09219 Supervision of pregnancy with history of pre-term labor, unspecified trimester: Secondary | ICD-10-CM

## 2013-04-05 DIAGNOSIS — O3430 Maternal care for cervical incompetence, unspecified trimester: Secondary | ICD-10-CM

## 2013-04-05 DIAGNOSIS — Z331 Pregnant state, incidental: Secondary | ICD-10-CM

## 2013-04-05 DIAGNOSIS — E079 Disorder of thyroid, unspecified: Secondary | ICD-10-CM

## 2013-04-05 DIAGNOSIS — O09893 Supervision of other high risk pregnancies, third trimester: Secondary | ICD-10-CM

## 2013-04-05 DIAGNOSIS — Z1389 Encounter for screening for other disorder: Secondary | ICD-10-CM

## 2013-04-05 LAB — POCT URINALYSIS DIPSTICK
Blood, UA: NEGATIVE
Ketones, UA: NEGATIVE
Protein, UA: NEGATIVE

## 2013-04-05 MED ORDER — HYDROCODONE-HOMATROPINE 5-1.5 MG/5ML PO SYRP: 5.0000 mL | ORAL_SOLUTION | Freq: Four times a day (QID) | ORAL | Status: DC | PRN

## 2013-04-05 MED ORDER — HYDROXYPROGESTERONE CAPROATE 250 MG/ML IM OIL
250.0000 mg | TOPICAL_OIL | Freq: Once | INTRAMUSCULAR | Status: AC
  Administered 2013-04-05: 250 mg via INTRAMUSCULAR

## 2013-04-05 NOTE — Progress Notes (Signed)
No bleeding , spotting or contractions. Mild cough , will tx. Antitussive. Now [redacted]w[redacted]d.

## 2013-04-05 NOTE — Progress Notes (Signed)
Pt denies any problems or concerns at this time.  

## 2013-04-05 NOTE — Patient Instructions (Signed)
Use cough medicine dinnertime

## 2013-04-12 ENCOUNTER — Ambulatory Visit (INDEPENDENT_AMBULATORY_CARE_PROVIDER_SITE_OTHER): Payer: BC Managed Care – PPO | Admitting: Adult Health

## 2013-04-12 ENCOUNTER — Encounter: Payer: Self-pay | Admitting: Adult Health

## 2013-04-12 VITALS — BP 128/80 | Ht 63.0 in

## 2013-04-12 DIAGNOSIS — O3430 Maternal care for cervical incompetence, unspecified trimester: Secondary | ICD-10-CM

## 2013-04-12 DIAGNOSIS — E039 Hypothyroidism, unspecified: Secondary | ICD-10-CM

## 2013-04-12 DIAGNOSIS — O09819 Supervision of pregnancy resulting from assisted reproductive technology, unspecified trimester: Secondary | ICD-10-CM

## 2013-04-12 DIAGNOSIS — O09219 Supervision of pregnancy with history of pre-term labor, unspecified trimester: Secondary | ICD-10-CM

## 2013-04-12 DIAGNOSIS — O0993 Supervision of high risk pregnancy, unspecified, third trimester: Secondary | ICD-10-CM

## 2013-04-12 DIAGNOSIS — Z8751 Personal history of pre-term labor: Secondary | ICD-10-CM

## 2013-04-12 DIAGNOSIS — Z331 Pregnant state, incidental: Secondary | ICD-10-CM

## 2013-04-12 DIAGNOSIS — Z1389 Encounter for screening for other disorder: Secondary | ICD-10-CM

## 2013-04-12 LAB — POCT URINALYSIS DIPSTICK
Ketones, UA: NEGATIVE
Leukocytes, UA: NEGATIVE

## 2013-04-12 MED ORDER — HYDROXYPROGESTERONE CAPROATE 250 MG/ML IM OIL
250.0000 mg | TOPICAL_OIL | Freq: Once | INTRAMUSCULAR | Status: AC
  Administered 2013-04-12: 250 mg via INTRAMUSCULAR

## 2013-04-18 ENCOUNTER — Inpatient Hospital Stay (HOSPITAL_COMMUNITY): Payer: BC Managed Care – PPO

## 2013-04-18 ENCOUNTER — Encounter (HOSPITAL_COMMUNITY): Payer: Self-pay | Admitting: *Deleted

## 2013-04-18 ENCOUNTER — Inpatient Hospital Stay (HOSPITAL_COMMUNITY)
Admission: AD | Admit: 2013-04-18 | Discharge: 2013-04-18 | Disposition: A | Discharge status: Home / Self Care | Payer: BC Managed Care – PPO | Source: Ambulatory Visit | Attending: Family Medicine | Admitting: Family Medicine

## 2013-04-18 DIAGNOSIS — M549 Dorsalgia, unspecified: Secondary | ICD-10-CM | POA: Insufficient documentation

## 2013-04-18 DIAGNOSIS — N1330 Unspecified hydronephrosis: Secondary | ICD-10-CM | POA: Insufficient documentation

## 2013-04-18 DIAGNOSIS — O26839 Pregnancy related renal disease, unspecified trimester: Secondary | ICD-10-CM | POA: Insufficient documentation

## 2013-04-18 DIAGNOSIS — M545 Low back pain, unspecified: Secondary | ICD-10-CM | POA: Insufficient documentation

## 2013-04-18 DIAGNOSIS — N200 Calculus of kidney: Secondary | ICD-10-CM

## 2013-04-18 DIAGNOSIS — R319 Hematuria, unspecified: Secondary | ICD-10-CM | POA: Insufficient documentation

## 2013-04-18 LAB — URINE MICROSCOPIC-ADD ON

## 2013-04-18 LAB — URINALYSIS, ROUTINE W REFLEX MICROSCOPIC
Bilirubin Urine: NEGATIVE
Glucose, UA: NEGATIVE mg/dL
Protein, ur: NEGATIVE mg/dL
Specific Gravity, Urine: 1.015 (ref 1.005–1.030)
Urobilinogen, UA: 0.2 mg/dL (ref 0.0–1.0)
pH: 6 (ref 5.0–8.0)

## 2013-04-18 MED ORDER — OXYCODONE-ACETAMINOPHEN 5-325 MG PO TABS: 2.0000 | ORAL_TABLET | ORAL | Status: DC | PRN

## 2013-04-18 MED ORDER — OXYCODONE-ACETAMINOPHEN 5-325 MG PO TABS
1.0000 | ORAL_TABLET | Freq: Once | ORAL | Status: AC
  Administered 2013-04-18: 1 via ORAL
  Filled 2013-04-18: qty 1

## 2013-04-18 NOTE — MAU Note (Signed)
Patient states she woke up at 0500 with intense back pain. States she has a history of kidney stones and this feels like that. Denies leaking or bleeding and reports good fetal movement.

## 2013-04-18 NOTE — MAU Provider Note (Signed)
Chart reviewed and agree with management and plan.  

## 2013-04-18 NOTE — MAU Note (Addendum)
L flank pain started at 0500, sudden onset. States she has had kidney stones before and this feels similar. States pain is constant and gets worse just after urination. Patient has been on bedrest for incompetent cervix and has cerclage. Hx 2nd trimester loss at 23 weeks.

## 2013-04-18 NOTE — MAU Provider Note (Signed)
History     CSN: [id number]  Arrival date and time: 04/18/13 4098   First Provider Initiated Contact with Patient 04/18/13 [phone]      Chief Complaint  Patient presents with  . Back Pain   HPI  Ms. Kimberly Morgan is a 32 y.o. female G77P0100 [redacted]w[redacted]d who presents with left sided back pain that came on sudden ely this morning at 0500; woke the patient up out of her sleep.  The patient has a history of kidney stones and feels this pain is very similar. The pain feels like a hammer beating on her left side. The pain is constant and achy. She reports good fetal movement, denies LOF, vaginal bleeding, vaginal itching/burning, urinary symptoms, h/a, dizziness, n/v, or fever/chills.  She does not feel any contractions, just feels the baby kicking her stomach. She is currently on bedrest for incompetent cervix with cerclage placement.   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 Hypothyroidism   . Seasonal allergies   . UTI (lower urinary tract infection)   . Kidney stones   . Preterm labor   . 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 Hypothyroid 10/31/2012  . PONV (postoperative nausea and vomiting)     Past Surgical History  Procedure Laterality Date  . Cholecystectomy      laparoscopic- 2011  . Cervical cerclage  09/30/2011    Procedure: CERCLAGE CERVICAL;  Surgeon: [name], MD;  Location: [facility];  Service: Gynecology;  Laterality: N/A;  . Cervical cerclage N/A 12/01/2012    Procedure: [facility] CERCLAGE CERVICAL;  Surgeon: [name], MD;  Location: [facility];  Service: Gynecology;  Laterality: N/A;    Family History  Problem Relation Age of Onset  . Coronary artery disease Mother   . Hypertension Sister   . Cancer Maternal Grandfather      lung  . Diabetes Paternal Grandfather     History  Substance Use Topics  . Smoking status: Former Smoker -- 0.25 packs/day for 10 years    Types: Cigarettes    Quit date: 07/24/2011  . Smokeless tobacco: Never Used  . Alcohol Use: No    Allergies:  Allergies  Allergen Reactions  . Aspirin Anaphylaxis  . Eggs Or Egg-Derived Products Anaphylaxis    **Duck eggs only, can eat chicken eggs  . Bactrim [Sulfamethoxazole-Trimethoprim] Nausea And Vomiting  . Baker's Yeast [Yeast] Hives and Other (See Comments)    Reaction: extreme all over body yeast infections.    Prescriptions prior to admission  Medication Sig Dispense Refill  . acetaminophen (TYLENOL) 325 MG tablet Take 650 mg by mouth every 6 (six) hours as needed for headache.      . calcium carbonate (TUMS - DOSED IN MG ELEMENTAL CALCIUM) 500 MG chewable tablet Chew 1 tablet by mouth daily as needed for indigestion or heartburn.      [name] HYDROXYPROGESTERONE CAPROATE IM Inject 1 each into the muscle once a week. Patient receives injection every Wednesday.      . levothyroxine (SYNTHROID, LEVOTHROID) 100 MCG tablet Take 100 mcg by mouth daily.      . Prenatal Vit-Fe Fumarate-FA (PRENATAL MULTIVITAMIN) TABS tablet Take 1  tablet by mouth daily at 12 noon.      . progesterone (PROMETRIUM) 200 MG capsule 1 vaginally nightly  30 capsule  6   Results for orders placed during the hospital encounter of 04/18/13 (from the past 24 hour(s))  URINALYSIS, ROUTINE W REFLEX MICROSCOPIC     Status: Abnormal   Collection Time    04/18/13  7:45 AM      Result Value Range   Color, Urine YELLOW  YELLOW   APPearance CLEAR  CLEAR   Specific Gravity, Urine 1.015  1.005 - 1.030   pH 6.0  5.0 - 8.0   Glucose, UA NEGATIVE  NEGATIVE mg/dL   Hgb urine dipstick SMALL (*) NEGATIVE   Bilirubin Urine NEGATIVE  NEGATIVE   Ketones, ur NEGATIVE  NEGATIVE mg/dL   Protein, ur NEGATIVE  NEGATIVE mg/dL   Urobilinogen, UA 0.2  0.0 - 1.0 mg/dL   Nitrite  NEGATIVE  NEGATIVE   Leukocytes, UA TRACE (*) NEGATIVE  URINE MICROSCOPIC-ADD ON     Status: None   Collection Time    04/18/13  7:45 AM      Result Value Range   Squamous Epithelial / LPF RARE  RARE   WBC, UA 3-6  <3 WBC/hpf   Bacteria, UA RARE  RARE   Review of Systems  Constitutional: Negative for fever and chills.  Gastrointestinal: Negative for nausea, vomiting, abdominal pain, diarrhea and constipation.  Genitourinary: Positive for flank pain. Negative for dysuria, urgency, frequency and hematuria.       No vaginal discharge. No vaginal bleeding. No dysuria.   Musculoskeletal: Positive for back pain.   Physical Exam   Blood pressure 130/74, pulse 91, temperature 98.8 F (37.1 C), temperature source Oral, resp. rate 20, height 5\' 3"  (1.6 m), weight 103.329 kg (227 lb 12.8 oz), last menstrual period 02/23/2012, SpO2 99.00%.  Physical Exam  Constitutional: She is oriented to person, place, and time. She appears well-developed and well-nourished. No distress.  HENT:  Head: Normocephalic.  Neck: Neck supple.  GI: Soft. She exhibits no distension. There is no tenderness. There is no rebound and no guarding.  Genitourinary:  Speculum exam: Vagina - Moderate amount of thick, white, creamy discharge, no odor Cervix - No active bleeding, thick discharge covering cervical os- pt taking progesterone suppositories. No active bleeding, cerclage noted     Neurological: She is alert and oriented to person, place, and time.  Skin: Skin is warm. She is not diaphoretic.  Psychiatric: Her behavior is normal.   Fetal Tracing:   Baseline: 130s  Variability: mod Accelerations: 15x15 present  Decelerations: none Toco: rare none felt >83m apart   MAU Course  Procedures None  MDM Percocet 1 tab UA Renal US  CLINICAL DATA: Hematuria. Left low back pain. Pregnant patient.  EXAM:  RENAL/URINARY TRACT ULTRASOUND COMPLETE  COMPARISON: None.  FINDINGS:  Right Kidney  Length:  12.5 cm. Echogenicity within normal limits. No mass or  hydronephrosis visualized.  Left Kidney  Length: 12.6 cm. Mild left hydronephrosis is identified. No stone or  mass is seen.  Bladder  Urinary bladder is unremarkable. Fetus is partially visualized.  IMPRESSION:  Mild left hydronephrosis. Otherwise negative.   CLINICAL DATA: Hematuria. Left low back pain. Pregnant patient.  EXAM:  RENAL/URINARY TRACT ULTRASOUND COMPLETE  COMPARISON: None.  FINDINGS:  Right Kidney  Length: 12.5 cm. Echogenicity within normal limits. No mass or  hydronephrosis visualized.  Left Kidney  Length: 12.6 cm. Mild left hydronephrosis is identified. No stone  or  mass is seen.  Bladder  Urinary bladder is unremarkable. Fetus is partially visualized.  IMPRESSION:  Mild left hydronephrosis. Otherwise negative.   Assessment and Plan  Kimberly Morgan is a 32 y.o. G2P0100 at [redacted]w[redacted]d presents with likely nephrolithiasis given hx and inc blood in urine. Unlikely pyelonephritis given afebirle and trace LE. Will discharge with instructions for hydration, decrease soda intake, and strain urine. Pt w/ f/u tomrrow and given return precautions.  Tawana Scale 04/18/2013, 10:55 AM

## 2013-04-19 ENCOUNTER — Ambulatory Visit (INDEPENDENT_AMBULATORY_CARE_PROVIDER_SITE_OTHER): Payer: BC Managed Care – PPO | Admitting: Obstetrics and Gynecology

## 2013-04-19 ENCOUNTER — Encounter: Payer: BC Managed Care – PPO | Admitting: Obstetrics and Gynecology

## 2013-04-19 VITALS — BP 120/76 | Wt 228.0 lb

## 2013-04-19 DIAGNOSIS — O09219 Supervision of pregnancy with history of pre-term labor, unspecified trimester: Secondary | ICD-10-CM

## 2013-04-19 DIAGNOSIS — O3430 Maternal care for cervical incompetence, unspecified trimester: Secondary | ICD-10-CM

## 2013-04-19 DIAGNOSIS — Z331 Pregnant state, incidental: Secondary | ICD-10-CM

## 2013-04-19 DIAGNOSIS — E079 Disorder of thyroid, unspecified: Secondary | ICD-10-CM

## 2013-04-19 DIAGNOSIS — Z1389 Encounter for screening for other disorder: Secondary | ICD-10-CM

## 2013-04-19 LAB — POCT URINALYSIS DIPSTICK
Ketones, UA: NEGATIVE
Protein, UA: NEGATIVE

## 2013-04-19 LAB — URINE CULTURE

## 2013-04-19 MED ORDER — HYDROXYPROGESTERONE CAPROATE 250 MG/ML IM OIL
250.0000 mg | TOPICAL_OIL | Freq: Once | INTRAMUSCULAR | Status: AC
  Administered 2013-04-19: 250 mg via INTRAMUSCULAR

## 2013-04-19 NOTE — Progress Notes (Signed)
No ptl sx. No bleeding or srom. Cx1 cm, Cerclage stitch easily palpable within cervix. Plan. Removes stitch 36--37 wk continue 17-P til then as well as prometrium

## 2013-04-19 NOTE — Progress Notes (Signed)
Pt here today for routine visit and 17P . Pt states that she is having some pelvic pressure. Pt went to ER at [facility] for kidney stone, pt had [place].

## 2013-04-26 ENCOUNTER — Encounter (INDEPENDENT_AMBULATORY_CARE_PROVIDER_SITE_OTHER): Payer: Self-pay

## 2013-04-26 ENCOUNTER — Ambulatory Visit (INDEPENDENT_AMBULATORY_CARE_PROVIDER_SITE_OTHER): Payer: BC Managed Care – PPO | Admitting: Adult Health

## 2013-04-26 ENCOUNTER — Encounter: Payer: Self-pay | Admitting: Adult Health

## 2013-04-26 VITALS — BP 128/80 | Ht 63.0 in | Wt 232.0 lb

## 2013-04-26 DIAGNOSIS — O3430 Maternal care for cervical incompetence, unspecified trimester: Secondary | ICD-10-CM

## 2013-04-26 DIAGNOSIS — O09819 Supervision of pregnancy resulting from assisted reproductive technology, unspecified trimester: Secondary | ICD-10-CM

## 2013-04-26 DIAGNOSIS — O0993 Supervision of high risk pregnancy, unspecified, third trimester: Secondary | ICD-10-CM

## 2013-04-26 DIAGNOSIS — Z8751 Personal history of pre-term labor: Secondary | ICD-10-CM

## 2013-04-26 DIAGNOSIS — E039 Hypothyroidism, unspecified: Secondary | ICD-10-CM

## 2013-04-26 DIAGNOSIS — Z1389 Encounter for screening for other disorder: Secondary | ICD-10-CM

## 2013-04-26 DIAGNOSIS — O09219 Supervision of pregnancy with history of pre-term labor, unspecified trimester: Secondary | ICD-10-CM

## 2013-04-26 DIAGNOSIS — Z331 Pregnant state, incidental: Secondary | ICD-10-CM

## 2013-04-26 LAB — POCT URINALYSIS DIPSTICK
Glucose, UA: NEGATIVE
Ketones, UA: NEGATIVE

## 2013-04-26 MED ORDER — HYDROXYPROGESTERONE CAPROATE 250 MG/ML IM OIL
250.0000 mg | TOPICAL_OIL | Freq: Once | INTRAMUSCULAR | Status: AC
  Administered 2013-04-26: 250 mg via INTRAMUSCULAR

## 2013-04-26 NOTE — Progress Notes (Signed)
Patient ID: Kimberly Morgan, female   DOB: [birth year], 32 y.o.   MRN: [id number] Pt here today for 17 P injection only. Pt tolerated injection well, no complaints at this time.

## 2013-04-27 ENCOUNTER — Telehealth: Payer: Self-pay | Admitting: *Deleted

## 2013-04-27 NOTE — Telephone Encounter (Signed)
Spoke with [facility]. Pt states she just passed a big kidney stone and is now bleeding. Advised ER for evaluation. Pt voiced understanding. JSY

## 2013-05-05 ENCOUNTER — Encounter: Payer: BC Managed Care – PPO | Admitting: Obstetrics and Gynecology

## 2013-05-05 ENCOUNTER — Encounter: Payer: Self-pay | Admitting: Obstetrics and Gynecology

## 2013-05-05 ENCOUNTER — Ambulatory Visit (INDEPENDENT_AMBULATORY_CARE_PROVIDER_SITE_OTHER): Payer: BC Managed Care – PPO | Admitting: Obstetrics and Gynecology

## 2013-05-05 VITALS — BP 130/80 | Wt 234.0 lb

## 2013-05-05 DIAGNOSIS — Z1389 Encounter for screening for other disorder: Secondary | ICD-10-CM

## 2013-05-05 DIAGNOSIS — O3430 Maternal care for cervical incompetence, unspecified trimester: Secondary | ICD-10-CM

## 2013-05-05 DIAGNOSIS — O3433 Maternal care for cervical incompetence, third trimester: Secondary | ICD-10-CM

## 2013-05-05 DIAGNOSIS — E039 Hypothyroidism, unspecified: Secondary | ICD-10-CM

## 2013-05-05 DIAGNOSIS — O09813 Supervision of pregnancy resulting from assisted reproductive technology, third trimester: Secondary | ICD-10-CM

## 2013-05-05 DIAGNOSIS — Z331 Pregnant state, incidental: Secondary | ICD-10-CM

## 2013-05-05 DIAGNOSIS — O09899 Supervision of other high risk pregnancies, unspecified trimester: Secondary | ICD-10-CM

## 2013-05-05 DIAGNOSIS — O0993 Supervision of high risk pregnancy, unspecified, third trimester: Secondary | ICD-10-CM

## 2013-05-05 LAB — POCT URINALYSIS DIPSTICK
Blood, UA: NEGATIVE
Glucose, UA: NEGATIVE
Leukocytes, UA: NEGATIVE
Nitrite, UA: NEGATIVE

## 2013-05-05 MED ORDER — HYDROXYPROGESTERONE CAPROATE 250 MG/ML IM OIL
250.0000 mg | TOPICAL_OIL | Freq: Once | INTRAMUSCULAR | Status: AC
  Administered 2013-05-05: 250 mg via INTRAMUSCULAR

## 2013-05-05 NOTE — Progress Notes (Signed)
Mcdonald prolene stitch removed w/o difficulty. Good FM. No bleeding

## 2013-05-05 NOTE — Progress Notes (Signed)
Pt states that she has been having some pelvic pain. Pt denies any other problems and concerns at this time.

## 2013-05-05 NOTE — Patient Instructions (Signed)
Continue prometrium til 37 wk.

## 2013-05-15 ENCOUNTER — Ambulatory Visit (INDEPENDENT_AMBULATORY_CARE_PROVIDER_SITE_OTHER): Payer: Self-pay | Admitting: Obstetrics and Gynecology

## 2013-05-15 VITALS — BP 130/84 | Wt 234.2 lb

## 2013-05-15 DIAGNOSIS — N200 Calculus of kidney: Secondary | ICD-10-CM

## 2013-05-15 DIAGNOSIS — O99280 Endocrine, nutritional and metabolic diseases complicating pregnancy, unspecified trimester: Secondary | ICD-10-CM

## 2013-05-15 DIAGNOSIS — O3430 Maternal care for cervical incompetence, unspecified trimester: Secondary | ICD-10-CM

## 2013-05-15 DIAGNOSIS — O09819 Supervision of pregnancy resulting from assisted reproductive technology, unspecified trimester: Secondary | ICD-10-CM

## 2013-05-15 DIAGNOSIS — Z331 Pregnant state, incidental: Secondary | ICD-10-CM

## 2013-05-15 DIAGNOSIS — E079 Disorder of thyroid, unspecified: Secondary | ICD-10-CM

## 2013-05-15 DIAGNOSIS — O09899 Supervision of other high risk pregnancies, unspecified trimester: Secondary | ICD-10-CM

## 2013-05-15 DIAGNOSIS — O3433 Maternal care for cervical incompetence, third trimester: Secondary | ICD-10-CM

## 2013-05-15 DIAGNOSIS — Z1389 Encounter for screening for other disorder: Secondary | ICD-10-CM

## 2013-05-15 DIAGNOSIS — O09299 Supervision of pregnancy with other poor reproductive or obstetric history, unspecified trimester: Secondary | ICD-10-CM

## 2013-05-15 DIAGNOSIS — O09219 Supervision of pregnancy with history of pre-term labor, unspecified trimester: Secondary | ICD-10-CM

## 2013-05-15 DIAGNOSIS — Z3480 Encounter for supervision of other normal pregnancy, unspecified trimester: Secondary | ICD-10-CM

## 2013-05-15 LAB — POCT URINALYSIS DIPSTICK
Glucose, UA: NEGATIVE
Nitrite, UA: NEGATIVE
Protein, UA: NEGATIVE

## 2013-05-15 LAB — OB RESULTS CONSOLE GC/CHLAMYDIA: Chlamydia: NEGATIVE

## 2013-05-15 NOTE — Progress Notes (Signed)
Good FM, minimal contractions, no bleeding or fluid.

## 2013-05-16 LAB — GC/CHLAMYDIA PROBE AMP
CT Probe RNA: NEGATIVE
GC Probe RNA: NEGATIVE

## 2013-05-17 LAB — STREP B DNA PROBE: GBSP: NEGATIVE

## 2013-05-20 ENCOUNTER — Inpatient Hospital Stay (HOSPITAL_COMMUNITY)
Admission: AD | Admit: 2013-05-20 | Discharge: 2013-05-23 | DRG: 775 | Disposition: A | Discharge status: Home / Self Care | Payer: BC Managed Care – PPO | Source: Ambulatory Visit | Attending: Obstetrics & Gynecology | Admitting: Obstetrics & Gynecology

## 2013-05-20 DIAGNOSIS — E039 Hypothyroidism, unspecified: Secondary | ICD-10-CM

## 2013-05-20 DIAGNOSIS — Z87891 Personal history of nicotine dependence: Secondary | ICD-10-CM

## 2013-05-20 DIAGNOSIS — O09813 Supervision of pregnancy resulting from assisted reproductive technology, third trimester: Secondary | ICD-10-CM

## 2013-05-20 DIAGNOSIS — E079 Disorder of thyroid, unspecified: Secondary | ICD-10-CM | POA: Diagnosis present

## 2013-05-20 DIAGNOSIS — O3430 Maternal care for cervical incompetence, unspecified trimester: Secondary | ICD-10-CM | POA: Diagnosis present

## 2013-05-20 DIAGNOSIS — O4290 Premature rupture of membranes, unspecified as to length of time between rupture and onset of labor, unspecified weeks of gestation: Secondary | ICD-10-CM | POA: Diagnosis present

## 2013-05-21 ENCOUNTER — Inpatient Hospital Stay (HOSPITAL_COMMUNITY): Payer: BC Managed Care – PPO | Admitting: Anesthesiology

## 2013-05-21 ENCOUNTER — Inpatient Hospital Stay (HOSPITAL_COMMUNITY): Payer: BC Managed Care – PPO

## 2013-05-21 ENCOUNTER — Encounter (HOSPITAL_COMMUNITY): Payer: Self-pay

## 2013-05-21 ENCOUNTER — Encounter (HOSPITAL_COMMUNITY): Payer: BC Managed Care – PPO | Admitting: Anesthesiology

## 2013-05-21 DIAGNOSIS — O99284 Endocrine, nutritional and metabolic diseases complicating childbirth: Secondary | ICD-10-CM

## 2013-05-21 DIAGNOSIS — E079 Disorder of thyroid, unspecified: Secondary | ICD-10-CM

## 2013-05-21 DIAGNOSIS — O4290 Premature rupture of membranes, unspecified as to length of time between rupture and onset of labor, unspecified weeks of gestation: Secondary | ICD-10-CM

## 2013-05-21 DIAGNOSIS — O3430 Maternal care for cervical incompetence, unspecified trimester: Secondary | ICD-10-CM

## 2013-05-21 LAB — ABO/RH: ABO/RH(D): O POS

## 2013-05-21 LAB — TYPE AND SCREEN: ABO/RH(D): O POS

## 2013-05-21 LAB — CBC
HCT: 34 % — ABNORMAL LOW (ref 36.0–46.0)
Hemoglobin: 11.7 g/dL — ABNORMAL LOW (ref 12.0–15.0)
MCH: 30.3 pg (ref 26.0–34.0)
MCHC: 34.4 g/dL (ref 30.0–36.0)
MCV: 88.1 fL (ref 78.0–100.0)
RDW: 13.7 % (ref 11.5–15.5)

## 2013-05-21 MED ORDER — LIDOCAINE HCL (PF) 1 % IJ SOLN
30.0000 mL | INTRAMUSCULAR | Status: AC | PRN
  Administered 2013-05-21: 30 mL via SUBCUTANEOUS
  Filled 2013-05-21 (×2): qty 30

## 2013-05-21 MED ORDER — DIPHENHYDRAMINE HCL 25 MG PO CAPS: 25.0000 mg | ORAL_CAPSULE | Freq: Four times a day (QID) | ORAL | Status: DC | PRN

## 2013-05-21 MED ORDER — OXYCODONE-ACETAMINOPHEN 5-325 MG PO TABS: 1.0000 | ORAL_TABLET | ORAL | Status: DC | PRN

## 2013-05-21 MED ORDER — FENTANYL 2.5 MCG/ML BUPIVACAINE 1/10 % EPIDURAL INFUSION (WH - ANES)
INTRAMUSCULAR | Status: DC | PRN
  Administered 2013-05-21: 14 mL/h via EPIDURAL

## 2013-05-21 MED ORDER — SIMETHICONE 80 MG PO CHEW: 80.0000 mg | CHEWABLE_TABLET | ORAL | Status: DC | PRN

## 2013-05-21 MED ORDER — ACETAMINOPHEN 325 MG PO TABS
650.0000 mg | ORAL_TABLET | ORAL | Status: DC | PRN
  Administered 2013-05-22 – 2013-05-23 (×5): 650 mg via ORAL
  Filled 2013-05-21 (×6): qty 2

## 2013-05-21 MED ORDER — FENTANYL CITRATE 0.05 MG/ML IJ SOLN
50.0000 ug | INTRAMUSCULAR | Status: DC | PRN
  Administered 2013-05-21: 50 ug via INTRAVENOUS
  Filled 2013-05-21: qty 2

## 2013-05-21 MED ORDER — IBUPROFEN 600 MG PO TABS: 600.0000 mg | ORAL_TABLET | Freq: Four times a day (QID) | ORAL | Status: DC

## 2013-05-21 MED ORDER — ONDANSETRON HCL 4 MG/2ML IJ SOLN
4.0000 mg | Freq: Four times a day (QID) | INTRAMUSCULAR | Status: DC | PRN
  Administered 2013-05-21 (×2): 4 mg via INTRAVENOUS
  Filled 2013-05-21 (×2): qty 2

## 2013-05-21 MED ORDER — ZOLPIDEM TARTRATE 5 MG PO TABS: 5.0000 mg | ORAL_TABLET | Freq: Every evening | ORAL | Status: DC | PRN

## 2013-05-21 MED ORDER — CITRIC ACID-SODIUM CITRATE 334-500 MG/5ML PO SOLN: 30.0000 mL | ORAL | Status: DC | PRN

## 2013-05-21 MED ORDER — BENZOCAINE-MENTHOL 20-0.5 % EX AERO
1.0000 "application " | INHALATION_SPRAY | CUTANEOUS | Status: DC | PRN
  Filled 2013-05-21: qty 56

## 2013-05-21 MED ORDER — LACTATED RINGERS IV SOLN
500.0000 mL | INTRAVENOUS | Status: DC | PRN
  Administered 2013-05-21: 500 mL via INTRAVENOUS

## 2013-05-21 MED ORDER — LANOLIN HYDROUS EX OINT: TOPICAL_OINTMENT | CUTANEOUS | Status: DC | PRN

## 2013-05-21 MED ORDER — DIPHENHYDRAMINE HCL 50 MG/ML IJ SOLN: 12.5000 mg | INTRAMUSCULAR | Status: DC | PRN

## 2013-05-21 MED ORDER — DIBUCAINE 1 % RE OINT
1.0000 "application " | TOPICAL_OINTMENT | RECTAL | Status: DC | PRN
  Administered 2013-05-22: 1 via RECTAL
  Filled 2013-05-21: qty 28

## 2013-05-21 MED ORDER — OXYTOCIN BOLUS FROM INFUSION: 500.0000 mL | INTRAVENOUS | Status: DC

## 2013-05-21 MED ORDER — OXYTOCIN 40 UNITS IN LACTATED RINGERS INFUSION - SIMPLE MED
1.0000 m[IU]/min | INTRAVENOUS | Status: DC
  Administered 2013-05-21: 2 m[IU]/min via INTRAVENOUS
  Filled 2013-05-21: qty 1000

## 2013-05-21 MED ORDER — LACTATED RINGERS IV SOLN
INTRAVENOUS | Status: DC
  Administered 2013-05-21 (×2): via INTRAVENOUS

## 2013-05-21 MED ORDER — SENNOSIDES-DOCUSATE SODIUM 8.6-50 MG PO TABS
2.0000 | ORAL_TABLET | ORAL | Status: DC
  Administered 2013-05-22 (×2): 2 via ORAL
  Filled 2013-05-21 (×2): qty 2

## 2013-05-21 MED ORDER — WITCH HAZEL-GLYCERIN EX PADS
1.0000 "application " | MEDICATED_PAD | CUTANEOUS | Status: DC | PRN
  Administered 2013-05-22: 1 via TOPICAL

## 2013-05-21 MED ORDER — FENTANYL 2.5 MCG/ML BUPIVACAINE 1/10 % EPIDURAL INFUSION (WH - ANES)
14.0000 mL/h | INTRAMUSCULAR | Status: DC | PRN
  Administered 2013-05-21: 14 mL/h via EPIDURAL
  Filled 2013-05-21 (×2): qty 125

## 2013-05-21 MED ORDER — LIDOCAINE HCL (PF) 1 % IJ SOLN
INTRAMUSCULAR | Status: DC | PRN
  Administered 2013-05-21 (×2): 4 mL

## 2013-05-21 MED ORDER — ACETAMINOPHEN 325 MG PO TABS
650.0000 mg | ORAL_TABLET | ORAL | Status: DC | PRN
  Administered 2013-05-21: 650 mg via ORAL
  Filled 2013-05-21: qty 2

## 2013-05-21 MED ORDER — PHENYLEPHRINE 40 MCG/ML (10ML) SYRINGE FOR IV PUSH (FOR BLOOD PRESSURE SUPPORT)
80.0000 ug | PREFILLED_SYRINGE | INTRAVENOUS | Status: DC | PRN
  Filled 2013-05-21: qty 2

## 2013-05-21 MED ORDER — LACTATED RINGERS IV SOLN: 500.0000 mL | Freq: Once | INTRAVENOUS | Status: DC

## 2013-05-21 MED ORDER — LEVOTHYROXINE SODIUM 100 MCG PO TABS
100.0000 ug | ORAL_TABLET | Freq: Every day | ORAL | Status: DC
  Administered 2013-05-22 – 2013-05-23 (×2): 100 ug via ORAL
  Filled 2013-05-21 (×2): qty 1

## 2013-05-21 MED ORDER — ONDANSETRON HCL 4 MG/2ML IJ SOLN: 4.0000 mg | INTRAMUSCULAR | Status: DC | PRN

## 2013-05-21 MED ORDER — ONDANSETRON HCL 4 MG PO TABS: 4.0000 mg | ORAL_TABLET | ORAL | Status: DC | PRN

## 2013-05-21 MED ORDER — TETANUS-DIPHTH-ACELL PERTUSSIS 5-2.5-18.5 LF-MCG/0.5 IM SUSP: 0.5000 mL | Freq: Once | INTRAMUSCULAR | Status: DC

## 2013-05-21 MED ORDER — LEVOTHYROXINE SODIUM 100 MCG PO TABS
100.0000 ug | ORAL_TABLET | ORAL | Status: AC
  Administered 2013-05-21: 100 ug via ORAL
  Filled 2013-05-21: qty 1

## 2013-05-21 MED ORDER — PHENYLEPHRINE 40 MCG/ML (10ML) SYRINGE FOR IV PUSH (FOR BLOOD PRESSURE SUPPORT)
80.0000 ug | PREFILLED_SYRINGE | INTRAVENOUS | Status: DC | PRN
  Filled 2013-05-21: qty 2
  Filled 2013-05-21: qty 10

## 2013-05-21 MED ORDER — EPHEDRINE 5 MG/ML INJ
10.0000 mg | INTRAVENOUS | Status: DC | PRN
  Filled 2013-05-21: qty 4
  Filled 2013-05-21: qty 2

## 2013-05-21 MED ORDER — EPHEDRINE 5 MG/ML INJ
10.0000 mg | INTRAVENOUS | Status: DC | PRN
  Filled 2013-05-21: qty 2

## 2013-05-21 MED ORDER — OXYTOCIN 40 UNITS IN LACTATED RINGERS INFUSION - SIMPLE MED: 62.5000 mL/h | INTRAVENOUS | Status: DC

## 2013-05-21 MED ORDER — TERBUTALINE SULFATE 1 MG/ML IJ SOLN: 0.2500 mg | Freq: Once | INTRAMUSCULAR | Status: DC | PRN

## 2013-05-21 MED ORDER — PRENATAL MULTIVITAMIN CH
1.0000 | ORAL_TABLET | Freq: Every day | ORAL | Status: DC
  Administered 2013-05-21 – 2013-05-22 (×2): 1 via ORAL
  Filled 2013-05-21 (×3): qty 1

## 2013-05-21 NOTE — Anesthesia Preprocedure Evaluation (Signed)
Anesthesia Evaluation  Patient identified by MRN, date of birth, ID band Patient awake    Reviewed: Allergy & Precautions, H&P , Patient's Chart, lab work & pertinent test results  History of Anesthesia Complications (+) PONV and history of anesthetic complications  Airway Mallampati: III TM Distance: >3 FB Neck ROM: Full    Dental no notable dental hx. (+) Teeth Intact   Pulmonary former smoker,  breath sounds clear to auscultation  Pulmonary exam normal       Cardiovascular negative cardio ROS  Rhythm:Regular Rate:Normal     Neuro/Psych negative neurological ROS  negative psych ROS   GI/Hepatic Neg liver ROS, GERD-  Medicated and Controlled,  Endo/Other  Hypothyroidism Morbid obesity  Renal/GU Renal diseaseRenal Calculi during pregnancy  negative genitourinary   Musculoskeletal negative musculoskeletal ROS (+)   Abdominal (+) + obese,   Peds  Hematology negative hematology ROS (+)   Anesthesia Other Findings   Reproductive/Obstetrics (+) Pregnancy SPROM Hx/o PTL S/P Cerclage                           Anesthesia Physical Anesthesia Plan  ASA: III  Anesthesia Plan: Epidural   Post-op Pain Management:    Induction:   Airway Management Planned: Natural Airway  Additional Equipment:   Intra-op Plan:   Post-operative Plan:   Informed Consent: I have reviewed the patients History and Physical, chart, labs and discussed the procedure including the risks, benefits and alternatives for the proposed anesthesia with the patient or authorized representative who has indicated his/her understanding and acceptance.     Plan Discussed with: Anesthesiologist  Anesthesia Plan Comments:         Anesthesia Quick Evaluation

## 2013-05-21 NOTE — H&P (Signed)
Kimberly Morgan is a 32 y.o. female at [redacted]w[redacted]d presenting for PROM at term.  RN Note: Water broke at 11 pm, clear fluid with blood tinge. Reports good FM.       Maternal Medical History:  Reason for admission: Rupture of membranes.  Nausea.  Contractions: Onset was 1-2 hours ago.   Frequency: irregular.   Perceived severity is moderate.    Fetal activity: Perceived fetal activity is normal.   Last perceived fetal movement was within the past hour.    Prenatal complications: No bleeding.   Prenatal Complications - Diabetes: none.    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 Hypothyroidism   . Seasonal allergies   . UTI (lower urinary tract infection)   . Kidney stones   . Preterm labor   . 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 Hypothyroid 10/31/2012  . PONV (postoperative nausea and vomiting)    Past Surgical History  Procedure Laterality Date  . Cholecystectomy      laparoscopic- 2011  . Cervical cerclage  09/30/2011    Procedure: CERCLAGE CERVICAL;  Surgeon: [name], MD;  Location: [facility];  Service: Gynecology;  Laterality: N/A;  . Cervical cerclage N/A 12/01/2012    Procedure: [facility] CERCLAGE CERVICAL;  Surgeon: [name], MD;  Location: [facility];  Service: Gynecology;  Laterality: N/A;   Family History: family history includes COPD in her mother; Cancer in her maternal grandfather; Diabetes in her paternal grandfather; Hypertension in her sister. Social History:  reports that she quit smoking about 21 months ago. Her smoking use included Cigarettes. She has a 2.5 pack-year smoking history. She has never used smokeless tobacco. She reports that she does not drink alcohol or use illicit drugs.   Prenatal Transfer Tool   Maternal Diabetes: No Genetic Screening: Normal Maternal Ultrasounds/Referrals: Normal Fetal Ultrasounds or other Referrals:  None Maternal Substance Abuse:  No Significant Maternal Medications:  None Significant Maternal Lab Results:  None Other Comments:  History of 22 wk loss, had Cerclage this pregnancy, now with SROM  Review of Systems  Constitutional: Negative for fever and chills.  Gastrointestinal: Positive for abdominal pain. Negative for nausea, vomiting, diarrhea and constipation.  Genitourinary: Negative for dysuria.  Neurological: Negative for dizziness and headaches.    Dilation: 3 Effacement (%): 80 Station: -2 Exam by:: [place] CNM Blood pressure 132/81, pulse 101, resp. rate 22, height 5\' 3"  (1.6 m), last menstrual period 02/23/2012. Exam Physical Exam  Prenatal labs: ABO, Rh: O/POS/-- (06/09 1630) Antibody: NEG (10/15 0943) Rubella: 1.25 (06/09 1630) RPR: NON REAC (10/15 0943)  HBsAg: NEGATIVE (06/09 1630)  HIV: NON REACTIVE (10/15 0943)  GBS: NEGATIVE (12/22 1020)   Assessment/Plan: A:  SIUP at [redacted]w[redacted]d        PROM at term       Early active labor  P:  Admit to [facility]      Routine orders      Analgesia prn      Anticipate SVD  [facility] 05/21/2013, 1:16 AM

## 2013-05-21 NOTE — Progress Notes (Signed)
Jessika Blasko is a 32 y.o. G2P0100 at [redacted]w[redacted]d by ultrasound admitted for rupture of membranes  Subjective: Now has epidural  Objective: BP 138/85  Pulse 104  Temp(Src) 98.4 F (36.9 C) (Oral)  Resp 18  Ht 5\' 3"  (1.6 m)  Wt 106.142 kg (234 lb)  BMI 41.46 kg/m2  SpO2 99%  LMP 02/23/2012      FHT:  FHR: 140 bpm, variability: moderate,  accelerations:  Present,  decelerations:  Absent UC:   irregular, every 4-7 minutes SVE:   Dilation: 4 Effacement (%): 80 Station: -2 Exam by:: T. Sprague RN  Labs: Lab Results  Component Value Date   WBC 15.3* 05/21/2013   HGB 11.7* 05/21/2013   HCT 34.0* 05/21/2013   MCV 88.1 05/21/2013   PLT 229 05/21/2013    Assessment / Plan: Spontaneous labor, progressing normally Uterine contractions spacing out some, may need Pitocin augmentation later  Labor: Progressing normally Preeclampsia:  n/a Fetal Wellbeing:  Category I Pain Control:  Epidural I/D:  n/a Anticipated MOD:  NSVD  Hammond Obeirne 05/21/2013, 3:58 AM

## 2013-05-21 NOTE — Anesthesia Postprocedure Evaluation (Signed)
Anesthesia Post Note  Patient: Kimberly Morgan  Procedure(s) Performed: * No procedures listed *  Anesthesia type: Epidural  Patient location: Mother/Baby  Post pain: Pain level controlled  Post assessment: Post-op Vital signs reviewed  Last Vitals:  Filed Vitals:   05/21/13 1730  BP: 121/71  Pulse: 91  Temp: 37.2 C  Resp: 18    Post vital signs: Reviewed  Level of consciousness:alert  Complications: No apparent anesthesia complications

## 2013-05-21 NOTE — MAU Note (Signed)
Water broke at 11 pm, clear fluid with blood tinge.  Reports good FM.

## 2013-05-21 NOTE — Progress Notes (Signed)
Kimberly Morgan is a 32 y.o. G2P0100 at [redacted]w[redacted]d.  Subjective: Comfortable with epidural  Objective: BP 123/67  Pulse 106  Temp(Src) 98.2 F (36.8 C) (Oral)  Resp 18  Ht 5\' 3"  (1.6 m)  Wt 106.142 kg (234 lb)  BMI 41.46 kg/m2  SpO2 91%  LMP 02/23/2012 I/O last 3 completed shifts: In: 300 [Other:300] Out: -     FHT:  FHR: 140 bpm, variability: moderate,  accelerations:  Present,  decelerations:  Absent UC:   regular, every 6 minutes SVE:   Dilation: 5.5 Effacement (%): 90 Station: -1 Exam by:: Katrinka Blazing (Vertex/hand)  Labs: Lab Results  Component Value Date   WBC 15.3* 05/21/2013   HGB 11.7* 05/21/2013   HCT 34.0* 05/21/2013   MCV 88.1 05/21/2013   PLT 229 05/21/2013    Assessment / Plan: Protracted latent phase  Labor: Start pitocin Fetal Wellbeing:  Category I Pain Control:  Epidural I/D:  n/a Anticipated MOD:  NSVD  STINSON, JACOB JEHIEL 05/21/2013, 8:48 AM

## 2013-05-21 NOTE — MAU Note (Signed)
Leaking fld since 2315. Clear fld. Some contractions

## 2013-05-21 NOTE — Progress Notes (Signed)
Kimberly Morgan is a 32 y.o. G2P0100 at [redacted]w[redacted]d  Subjective: Feeling pressure with contractions  Objective: BP 132/75  Pulse 114  Temp(Src) 98.6 F (37 C) (Oral)  Resp 18  Ht 5\' 3"  (1.6 m)  Wt 106.142 kg (234 lb)  BMI 41.46 kg/m2  SpO2 91%  LMP 02/23/2012 I/O last 3 completed shifts: In: 300 [Other:300] Out: -     FHT:  FHR: 140s bpm, variability: moderate,  accelerations:  Present,  decelerations:  Absent UC:   regular, every 2-3 minutes SVE:   Dilation: 10 Effacement (%): 100 Station: 0 Exam by:: Dr. Adrian Blackwater  Labs: Lab Results  Component Value Date   WBC 15.3* 05/21/2013   HGB 11.7* 05/21/2013   HCT 34.0* 05/21/2013   MCV 88.1 05/21/2013   PLT 229 05/21/2013    Assessment / Plan: Allow for passive descent - start pushing in 1 hour or when feeling constant pressure.  Anticipate vaginal delivery.  Kimberly Morgan 05/21/2013, 12:37 PM

## 2013-05-21 NOTE — Anesthesia Procedure Notes (Signed)
Epidural Patient location during procedure: OB Start time: 05/21/2013 3:47 AM  Staffing Anesthesiologist: Giuliano Preece A. Performed by: anesthesiologist   Preanesthetic Checklist Completed: patient identified, site marked, surgical consent, pre-op evaluation, timeout performed, IV checked, risks and benefits discussed and monitors and equipment checked  Epidural Patient position: sitting Prep: site prepped and draped and DuraPrep Patient monitoring: continuous pulse ox and blood pressure Approach: midline Injection technique: LOR air  Needle:  Needle type: Tuohy  Needle gauge: 17 G Needle length: 9 cm and 9 Needle insertion depth: 7 cm Catheter type: closed end flexible Catheter size: 19 Gauge Catheter at skin depth: 12 cm Test dose: negative and Other  Assessment Events: blood not aspirated, injection not painful, no injection resistance, negative IV test and no paresthesia  Additional Notes Patient identified. Risks and benefits discussed including failed block, incomplete  Pain control, post dural puncture headache, nerve damage, paralysis, blood pressure Changes, nausea, vomiting, reactions to medications-both toxic and allergic and post Partum back pain. All questions were answered. Patient expressed understanding and wished to proceed. Sterile technique was used throughout procedure. Epidural site was Dressed with sterile barrier dressing. No paresthesias, signs of intravascular injection Or signs of intrathecal spread were encountered.  Patient was more comfortable after the epidural was dosed. Please see RN's note for documentation of vital signs and FHR which are stable.

## 2013-05-22 ENCOUNTER — Encounter: Payer: BC Managed Care – PPO | Admitting: Obstetrics and Gynecology

## 2013-05-22 LAB — CBC
HCT: 31.9 % — ABNORMAL LOW (ref 36.0–46.0)
Hemoglobin: 10.9 g/dL — ABNORMAL LOW (ref 12.0–15.0)
MCH: 30.2 pg (ref 26.0–34.0)
Platelets: 200 10*3/uL (ref 150–400)
RBC: 3.61 MIL/uL — ABNORMAL LOW (ref 3.87–5.11)

## 2013-05-22 MED ORDER — HYDROCORTISONE 2.5 % RE CREA
TOPICAL_CREAM | Freq: Three times a day (TID) | RECTAL | Status: DC
  Administered 2013-05-22 (×3): via RECTAL
  Filled 2013-05-22: qty 28.35

## 2013-05-22 NOTE — Progress Notes (Signed)
Post Partum Day 1 Subjective: no complaints, up ad lib, voiding, tolerating PO, + flatus and complains of hemorroids and given anusol  Objective: Blood pressure 125/77, pulse 88, temperature 98 F (36.7 C), temperature source Oral, resp. rate 18, height 5\' 3"  (1.6 m), weight 106.142 kg (234 lb), last menstrual period 02/23/2012, SpO2 91.00%, unknown if currently breastfeeding.  Physical Exam:  General: alert, cooperative, appears stated age and no distress Lochia: appropriate Uterine Fundus: firm DVT Evaluation: No evidence of DVT seen on physical exam. Negative Homan's sign. No cords or calf tenderness. No significant calf/ankle edema.   Recent Labs  05/21/13 0214 05/22/13 0600  HGB 11.7* 10.9*  HCT 34.0* 31.9*    Assessment/Plan: Plan for discharge tomorrow, Breastfeeding, Lactation consult and Contraception declines   LOS: 2 days   Efstathios Sawin RYAN 05/22/2013, 8:05 AM

## 2013-05-23 ENCOUNTER — Encounter (HOSPITAL_COMMUNITY): Payer: Self-pay | Admitting: *Deleted

## 2013-05-23 ENCOUNTER — Other Ambulatory Visit: Payer: Self-pay | Admitting: Obstetrics and Gynecology

## 2013-05-23 MED ORDER — HYDROCORTISONE 2.5 % RE CREA: TOPICAL_CREAM | Freq: Three times a day (TID) | RECTAL | Status: DC

## 2013-05-23 NOTE — Lactation Note (Signed)
This note was copied from the chart of Girl Edithe Lookingbill. Lactation Consultation Note: Follow up visit with mom before DC. Mom reports that baby just finished feeding for 25 minutes, Baby asleep in mom's arms. She reports that breasts are feeling a little fuller this morning. Reports that she has a small blister on right nipple where she let the baby slide to the tip of nipple once during the night. Encouraged to rub EBM into nipple after nursing for healing. No questions at present. To call prn  Patient Name: Girl Essance Gatti [id number] Date: 05/23/2013 Reason for consult: Follow-up assessment   Maternal Data    Feeding    LATCH Score/Interventions       Type of Nipple: Everted at rest and after stimulation  Comfort (Breast/Nipple): Soft / non-tender           Lactation Tools Discussed/Used     Consult Status Consult Status: Complete    Pamelia Hoit 05/23/2013, 9:56 AM

## 2013-05-23 NOTE — Discharge Summary (Signed)
Obstetric Discharge Summary Reason for Admission: rupture of membranes Prenatal Procedures: cerclage and ultrasound Intrapartum Procedures: spontaneous vaginal delivery Postpartum Procedures: none Complications-Operative and Postpartum: 2nd degree perineal laceration Hemoglobin  Date Value Range Status  05/22/2013 10.9* 12.0 - 15.0 g/dL Final     HCT  Date Value Range Status  05/22/2013 31.9* 36.0 - 46.0 % Final    Physical Exam:  General: alert, cooperative, appears stated age and no distress Lochia: appropriate Uterine Fundus: firm Incision: NA DVT Evaluation: Negative Homan's sign.  Discharge Diagnoses: Term Pregnancy-delivered  Discharge Information: Date: 05/23/2013 Activity: unrestricted and pelvic rest Diet: routine Medications: PNV, Colace and Anusol Condition: stable Instructions: refer to practice specific booklet Discharge to: home Follow-up Information   Follow up with [facility] In 4 weeks. (or  as needed)    Contact information:   [address] [place] [address] [phone]      Follow up with [facility] [place] MATERNITY ADMISSIONS. (As needed in emergencies)    Contact information:   [address] [id number] [place] [address] [phone]      Newborn Data: Live born female  Birth Weight: 7 lb 0.9 oz (3200 g) APGAR: 7, 8  Home with mother. Breast No BC.  Jakirah Zaun 05/23/2013, 11:09 AM

## 2013-05-26 NOTE — Discharge Summary (Signed)
Attestation of Attending Supervision of Advanced Practitioner (CNM/NP): Evaluation and management procedures were performed by the Advanced Practitioner under my supervision and collaboration.  I have reviewed the Advanced Practitioner's note and chart, and I agree with the management and plan.  Kalai Baca 05/26/2013 12:42 PM

## 2013-05-28 NOTE — H&P (Signed)
Attestation of Attending Supervision of Advanced Practitioner (CNM/NP): Evaluation and management procedures were performed by the Advanced Practitioner under my supervision and collaboration. I have reviewed the Advanced Practitioner's note and chart, and I agree with the management and plan.  Armilda Vanderlinden H. 12:24 PM   

## 2013-05-31 DIAGNOSIS — Z029 Encounter for administrative examinations, unspecified: Secondary | ICD-10-CM

## 2013-06-20 ENCOUNTER — Ambulatory Visit: Payer: BC Managed Care – PPO | Admitting: Obstetrics and Gynecology

## 2013-06-22 ENCOUNTER — Ambulatory Visit (INDEPENDENT_AMBULATORY_CARE_PROVIDER_SITE_OTHER): Payer: BC Managed Care – PPO | Admitting: Obstetrics and Gynecology

## 2013-06-22 ENCOUNTER — Encounter: Payer: Self-pay | Admitting: Obstetrics and Gynecology

## 2013-06-22 NOTE — Progress Notes (Signed)
This chart was transcribed for Dr. Christin BachJohn Mag[facility] Group by Leone PayorSonum Patel, ED scribe.    Patient ID: Kimberly PapaStarlett Morgan, female   DOB: 16-Oct-1980, 33 y.o.   MRN: [id number]  Subjective:  Kimberly Morgan is a 33 y.o. female who presents for a 4 weeks postpartum visit  Patient concerns: healing tear that continues to cause discomfort with BM.  Prenatal and intrapartum course notable for IVF preg   Patient is not sexually active.   The following portions of the patient's history were reviewed and updated as appropriate: allergies, current medications, past family history, past medical history, past surgical history and problem list.  Review of Systems    See Subjective, otherwise negative ROS.  Objective:  BP 110/80  Ht 5\' 3"  (1.6 m)  Wt 208 lb 12.8 oz (94.711 kg)  BMI 37.00 kg/m2  Breastfeeding? Yes  General:  alert, cooperative and no distress     Lungs: clear to auscultation bilaterally  Heart:  regular rate and rhythm, S1, S2 normal, no murmur  Abdomen: soft, non-tender; bowel sounds normal; no masses,  no organomegaly   Vulva:  normal  Vagina: normal vagina, slow healing secondary laceration to right and midline, suture in place and easily removed.  Cervix:  normal  Corpus: normal size, contour, position, consistency, mobility, non-tender  Adnexa:  normal adnexa          Assessment:  1.  postpartum exam.  2. Contraception: none currently because not sexually active.  3.  Return prn  Plan:  Annual check

## 2013-06-22 NOTE — Patient Instructions (Signed)
Thanks so much for the shared journey thru this pregnancy Let us know if we can help or answer concerns.

## 2013-08-22 ENCOUNTER — Other Ambulatory Visit: Payer: Self-pay | Admitting: Obstetrics and Gynecology

## 2013-08-28 ENCOUNTER — Other Ambulatory Visit: Payer: Self-pay | Admitting: *Deleted

## 2013-09-01 MED ORDER — LEVOTHYROXINE SODIUM 100 MCG PO TABS
100.0000 ug | ORAL_TABLET | Freq: Every day | ORAL | Status: DC
Start: ? — End: 2014-04-22

## 2014-03-26 ENCOUNTER — Encounter: Payer: Self-pay | Admitting: Obstetrics and Gynecology

## 2014-04-09 ENCOUNTER — Ambulatory Visit (INDEPENDENT_AMBULATORY_CARE_PROVIDER_SITE_OTHER): Payer: BC Managed Care – PPO | Admitting: Obstetrics & Gynecology

## 2014-04-09 ENCOUNTER — Other Ambulatory Visit (HOSPITAL_COMMUNITY)
Admission: RE | Admit: 2014-04-09 | Discharge: 2014-04-09 | Disposition: A | Discharge status: Home / Self Care | Payer: BC Managed Care – PPO | Source: Ambulatory Visit | Attending: Obstetrics & Gynecology | Admitting: Obstetrics & Gynecology

## 2014-04-09 ENCOUNTER — Encounter: Payer: Self-pay | Admitting: Obstetrics & Gynecology

## 2014-04-09 VITALS — BP 120/80 | Ht 63.0 in | Wt 208.0 lb

## 2014-04-09 DIAGNOSIS — E039 Hypothyroidism, unspecified: Secondary | ICD-10-CM

## 2014-04-09 DIAGNOSIS — Z01419 Encounter for gynecological examination (general) (routine) without abnormal findings: Secondary | ICD-10-CM

## 2014-04-09 DIAGNOSIS — Z1151 Encounter for screening for human papillomavirus (HPV): Secondary | ICD-10-CM | POA: Insufficient documentation

## 2014-04-09 LAB — TSH: TSH: 12.469 u[IU]/mL — ABNORMAL HIGH (ref 0.350–4.500)

## 2014-04-09 NOTE — Progress Notes (Signed)
Patient ID: Kimberly Morgan, female   DOB: 18-Mar-1981, 33 y.o.   MRN: [id number] Subjective:     Kimberly Morgan is a 33 y.o. female here for a routine exam.  No LMP recorded. [id number] Birth Control Method:  none Menstrual Calendar(currently): irregular  Current complaints: none.   Current acute medical issues:  none   Recent Gynecologic History No LMP recorded. Last Pap: 2014,  normal Last mammogram: ,    Past Medical History  Diagnosis Date  . Hypothyroidism   . Seasonal allergies   . UTI (lower urinary tract infection)   . Kidney stones   . Preterm labor   . 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 Hypothyroid 10/31/2012  . PONV (postoperative nausea and vomiting)     Past Surgical History  Procedure Laterality Date  . Cholecystectomy      laparoscopic- 2011  . Cervical cerclage  09/30/2011    Procedure: CERCLAGE CERVICAL;  Surgeon: [name], MD;  Location: [facility];  Service: Gynecology;  Laterality: N/A;  . 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 Marital Status: Married    Spouse Name: N/A    Number of Children: N/A  . Years of Education: N/A   Social History Main Topics  . Smoking status: Former Smoker -- 0.25 packs/day for 10 years    Types: Cigarettes    Quit date: 07/24/2011  . Smokeless tobacco: Never Used  . Alcohol Use: No  . Drug Use: No  . Sexual Activity: Yes    Birth Control/ Protection: None   Other Topics Concern  . None   Social History Narrative    Family History  Problem Relation Age of Onset  . COPD Mother   . Hypertension Sister    . Cancer Maternal Grandfather     lung  . Diabetes Paternal Grandfather     Current outpatient prescriptions: levothyroxine (SYNTHROID, LEVOTHROID) 100 MCG tablet, Take 1 tablet (100 mcg total) by mouth daily before breakfast., Disp: 30 tablet, Rfl: 6;  Prenatal Vit-Fe Fumarate-FA (PRENATAL MULTIVITAMIN) TABS tablet, Take 1 tablet by mouth daily at 12 noon., Disp: , Rfl: ;  acetaminophen (TYLENOL) 325 MG tablet, Take 650 mg by mouth every 6 (six) hours as needed for headache., Disp: , Rfl:  calcium carbonate (TUMS - DOSED IN MG ELEMENTAL CALCIUM) 500 MG chewable tablet, Chew 1 tablet by mouth daily as needed for indigestion or heartburn., Disp: , Rfl: ;  hydrocortisone (ANUSOL-HC) 2.5 % rectal cream, Place rectally 3 (three) times daily., Disp: 30 g, Rfl: 1;  oxyCODONE-acetaminophen (PERCOCET/ROXICET) 5-325 MG per tablet, Take 1-2 tablets by mouth every 4 (four) hours as needed for severe pain., Disp: , Rfl:   Review of Systems  Review of Systems  Constitutional: Negative for fever, chills, weight loss, malaise/fatigue and diaphoresis.  HENT: Negative for hearing loss, ear pain, nosebleeds, congestion, sore throat, neck pain, tinnitus and ear discharge.   Eyes: Negative for blurred vision, double vision, photophobia, pain, discharge and redness.  Respiratory: Negative for cough, hemoptysis, sputum production, shortness of breath, wheezing and stridor.   Cardiovascular: Negative for chest pain, palpitations, orthopnea, claudication, leg swelling and PND.  Gastrointestinal: negative for abdominal pain. Negative for heartburn, nausea, vomiting, diarrhea, constipation, blood in stool and melena.  Genitourinary: Negative for dysuria, urgency, frequency, hematuria and flank pain.  Musculoskeletal: Negative for myalgias, back pain, joint pain and falls.  Skin: Negative for itching and rash.  Neurological: Negative for dizziness, tingling, tremors, sensory change, speech change, focal weakness,  seizures, loss of consciousness, weakness and headaches.  Endo/Heme/Allergies: Negative for environmental allergies and polydipsia. Does not bruise/bleed easily.  Psychiatric/Behavioral: Negative for depression, suicidal ideas, hallucinations, memory loss and substance abuse. The patient is not nervous/anxious and does not have insomnia.        Objective:  Blood pressure 120/80, height 5\' 3"  (1.6 m), weight 208 lb (94.348 kg), currently breastfeeding.   Physical Exam  Vitals reviewed. Constitutional: She is oriented to person, place, and time. She appears well-developed and well-nourished.  HENT:  Head: Normocephalic and atraumatic.        Right Ear: External ear normal.  Left Ear: External ear normal.  Nose: Nose normal.  Mouth/Throat: Oropharynx is clear and moist.  Eyes: Conjunctivae and EOM are normal. Pupils are equal, round, and reactive to light. Right eye exhibits no discharge. Left eye exhibits no discharge. No scleral icterus.  Neck: Normal range of motion. Neck supple. No tracheal deviation present. No thyromegaly present.  Cardiovascular: Normal rate, regular rhythm, normal heart sounds and intact distal pulses.  Exam reveals no gallop and no friction rub.   No murmur heard. Respiratory: Effort normal and breath sounds normal. No respiratory distress. She has no wheezes. She has no rales. She exhibits no tenderness.  GI: Soft. Bowel sounds are normal. She exhibits no distension and no mass. There is no tenderness. There is no rebound and no guarding.  Genitourinary:  Breasts no masses skin changes or nipple changes bilaterally      Vulva is normal without lesions Vagina is pink moist without discharge Cervix normal in appearance and pap is done Uterus is normal size shape and contour Adnexa is negative with normal sized ovaries   Musculoskeletal: Normal range of motion. She exhibits no edema and no tenderness.  Neurological: She is alert and oriented to person, place, and  time. She has normal reflexes. She displays normal reflexes. No cranial nerve deficit. She exhibits normal muscle tone. Coordination normal.  Skin: Skin is warm and dry. No rash noted. No erythema. No pallor.  Psychiatric: She has a normal mood and affect. Her behavior is normal. Judgment and thought content normal.       Assessment:    Healthy female exam.    Plan:    Follow up in: prn or 1 year.

## 2014-04-10 LAB — CYTOLOGY - PAP

## 2014-04-22 ENCOUNTER — Telehealth: Payer: Self-pay | Admitting: Obstetrics & Gynecology

## 2014-04-22 MED ORDER — LEVOTHYROXINE SODIUM 125 MCG PO TABS: 100.0000 ug | ORAL_TABLET | Freq: Every day | ORAL | Status: DC

## 2014-04-24 NOTE — Telephone Encounter (Signed)
Pt informed of elevated Thyroid result from 04/09/2014, also Dr. Despina HiddenEure increased Synthroid from 100 mcg to 125 mcg, call transferred to front staff for an appt to be scheduled after New Years to repeat TSH. Pt verbalized understanding.

## 2014-06-04 ENCOUNTER — Other Ambulatory Visit: Payer: BC Managed Care – PPO

## 2014-06-04 DIAGNOSIS — E039 Hypothyroidism, unspecified: Secondary | ICD-10-CM

## 2014-06-04 LAB — TSH: TSH: 1.78 u[IU]/mL (ref 0.350–4.500)

## 2014-06-05 ENCOUNTER — Telehealth: Payer: Self-pay | Admitting: Adult Health

## 2014-06-05 NOTE — Telephone Encounter (Signed)
Pt aware TSH good keep taking current dose

## 2014-07-05 ENCOUNTER — Ambulatory Visit (INDEPENDENT_AMBULATORY_CARE_PROVIDER_SITE_OTHER): Payer: BC Managed Care – PPO | Admitting: Advanced Practice Midwife

## 2014-07-05 ENCOUNTER — Encounter: Payer: Self-pay | Admitting: Advanced Practice Midwife

## 2014-07-05 VITALS — BP 124/78 | Ht 63.0 in | Wt 207.0 lb

## 2014-07-05 DIAGNOSIS — B9689 Other specified bacterial agents as the cause of diseases classified elsewhere: Secondary | ICD-10-CM

## 2014-07-05 DIAGNOSIS — N760 Acute vaginitis: Secondary | ICD-10-CM

## 2014-07-05 DIAGNOSIS — A499 Bacterial infection, unspecified: Secondary | ICD-10-CM

## 2014-07-05 MED ORDER — CLINDAMYCIN PHOSPHATE 100 MG VA SUPP: 100.0000 mg | Freq: Every day | VAGINAL | Status: DC

## 2014-07-05 NOTE — Progress Notes (Signed)
[facility]  Patient name: Kimberly Morgan Mckinny MRN [id number]  Date of birth: [birth year]  CC & HPI:  Kimberly Morgan Kimberly Morgan is a 34 y.o. Caucasian female presenting today for C/O increased vaginal dc with fishy odor for about a month. The odor is worse after intercourse, and she also has pain with intercourse. Denies itching/irritation. Her vaginal discharge is clear.  Pertinent History Reviewed:  Medical & Surgical Hx:   Past Medical History  Diagnosis Date  . Hypothyroidism   . Seasonal allergies   . UTI (lower urinary tract infection)   . Kidney stones   . Preterm labor   . 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 Hypothyroid 10/31/2012  . PONV (postoperative nausea and vomiting)    Past Surgical History  Procedure Laterality Date  . Cholecystectomy      laparoscopic- 2011  . Cervical cerclage  09/30/2011    Procedure: CERCLAGE CERVICAL;  Surgeon: Tilda BurrowJohn V Ferguson, MD;  Location: [facility];  Service: Gynecology;  Laterality: N/A;  . Cervical cerclage N/A 12/01/2012    Pro[facility];  Surgeon: Tilda BurrowJohn V Ferguson, MD;  Location: [facility];  Service: Gynecology;  Laterality: N/A;   Family History  Problem Relation Age of Onset  . COPD Mother   . Hypertension Sister   . Cancer Maternal Grandfather     lung  . Diabetes Paternal Grandfather     Current outpatient prescriptions:  .  levothyroxine (SYNTHROID, LEVOTHROID) 125 MCG tablet, Take 1 tablet (125 mcg total) by mouth daily before breakfast., Disp: 30 tablet, Rfl: 11 .  Prenatal Vit-Fe Fumarate-FA (PRENATAL MULTIVITAMIN) TABS tablet, Take 1 tablet by mouth daily at 12 noon., Disp: , Rfl:  .  acetaminophen (TYLENOL) 325 MG tablet, Take 650 mg by mouth every 6 (six) hours as needed for headache., Disp: , Rfl:  .  calcium carbonate (TUMS - DOSED IN MG ELEMENTAL CALCIUM) 500 MG chewable tablet, Chew 1 tablet by mouth daily as needed for indigestion or heartburn., Disp: , Rfl:  .  clindamycin (CLEOCIN) 100 MG vaginal  suppository, Place 1 suppository (100 mg total) vaginally at bedtime., Disp: 3 suppository, Rfl: 0 .  hydrocortisone (ANUSOL-HC) 2.5 % rectal cream, Place rectally 3 (three) times daily. (Patient not taking: Reported on 07/05/2014), Disp: 30 g, Rfl: 1 .  oxyCODONE-acetaminophen (PERCOCET/ROXICET) 5-325 MG per tablet, Take 1-2 tablets by mouth every 4 (four) hours as needed for severe pain., Disp: , Rfl:  Social History: Reviewed -  reports that she quit smoking about 2 years ago. Her smoking use included Cigarettes. She has a 2.5 pack-year smoking history. She has never used smokeless tobacco.  Review of Systems:   Constitutional: Negative for fever and chills Gastrointestinal: Negative for abdominal pain Genitourinary: Negative for dysuria and urgency, vaginal irritation or itching   Objective Findings:  Vitals: BP 124/78 mmHg  Ht 5\' 3"  (1.6 m)  Wt 93.895 kg (207 lb)  BMI 36.68 kg/m2  Breastfeeding? Yes  Physical Examination: General appearance - alert, well appearing, and in no distress Mental status - alert, oriented to person, place, and time Abdomen - soft, nontender, nondistended, no masses or organomegaly Pelvic - Vulva and vagina normal.  SSE:  Clear to whitish vaginal discharge with slight amine odor.  Wet prep:  Few clue, WBC; no trich or yeast     Assessment & Plan:  A:   Bacterial vaginosis P:  Cleocin 100mg  ovules qhs X3 (breastfeeding)   F/U prn   CRESENZO-DISHMAN,Ezri Fanguy CNM 07/05/2014 10:48 PM

## 2014-08-02 ENCOUNTER — Telehealth: Payer: Self-pay | Admitting: Women's Health

## 2014-08-02 MED ORDER — CLINDAMYCIN PHOSPHATE 100 MG VA SUPP: 100.0000 mg | Freq: Every day | VAGINAL | Status: DC

## 2014-08-02 NOTE — Telephone Encounter (Signed)
BV symptoms.  rx cleocin (breastfeeding).  May consider repHresh 2-3 x a week

## 2014-08-02 NOTE — Telephone Encounter (Signed)
Pt informed medication sent to pharmacy and Fran's recommendation of Rephresh.  Pt verbalized understanding.

## 2014-08-02 NOTE — Telephone Encounter (Signed)
Pt states she was treated last month for BV and she in now having the same problem with a foul odor especially after intercourse and was told if the problem returned she could call up here and get another RX sent to the pharmacy.  Pt states she is still breastfeeding and she uses [facility] in [place].

## 2014-09-12 ENCOUNTER — Encounter (HOSPITAL_COMMUNITY): Payer: Self-pay

## 2014-09-12 ENCOUNTER — Emergency Department (HOSPITAL_COMMUNITY)
Admission: EM | Admit: 2014-09-12 | Discharge: 2014-09-12 | Disposition: A | Discharge status: Home / Self Care | Payer: BC Managed Care – PPO | Attending: Emergency Medicine | Admitting: Emergency Medicine

## 2014-09-12 DIAGNOSIS — E039 Hypothyroidism, unspecified: Secondary | ICD-10-CM | POA: Insufficient documentation

## 2014-09-12 DIAGNOSIS — Z79899 Other long term (current) drug therapy: Secondary | ICD-10-CM | POA: Insufficient documentation

## 2014-09-12 DIAGNOSIS — Z792 Long term (current) use of antibiotics: Secondary | ICD-10-CM | POA: Insufficient documentation

## 2014-09-12 DIAGNOSIS — Z87442 Personal history of urinary calculi: Secondary | ICD-10-CM | POA: Diagnosis not present

## 2014-09-12 DIAGNOSIS — Z9049 Acquired absence of other specified parts of digestive tract: Secondary | ICD-10-CM | POA: Diagnosis not present

## 2014-09-12 DIAGNOSIS — Z87440 Personal history of urinary (tract) infections: Secondary | ICD-10-CM | POA: Insufficient documentation

## 2014-09-12 DIAGNOSIS — A047 Enterocolitis due to Clostridium difficile: Secondary | ICD-10-CM | POA: Insufficient documentation

## 2014-09-12 DIAGNOSIS — Z87891 Personal history of nicotine dependence: Secondary | ICD-10-CM | POA: Insufficient documentation

## 2014-09-12 DIAGNOSIS — R197 Diarrhea, unspecified: Secondary | ICD-10-CM | POA: Diagnosis present

## 2014-09-12 DIAGNOSIS — Z91040 Latex allergy status: Secondary | ICD-10-CM | POA: Insufficient documentation

## 2014-09-12 DIAGNOSIS — Z3202 Encounter for pregnancy test, result negative: Secondary | ICD-10-CM | POA: Insufficient documentation

## 2014-09-12 DIAGNOSIS — A0472 Enterocolitis due to Clostridium difficile, not specified as recurrent: Secondary | ICD-10-CM

## 2014-09-12 LAB — BASIC METABOLIC PANEL
Anion gap: 9 (ref 5–15)
BUN: 13 mg/dL (ref 6–23)
CALCIUM: 8.9 mg/dL (ref 8.4–10.5)
CO2: 26 mmol/L (ref 19–32)
Chloride: 105 mmol/L (ref 96–112)
Creatinine, Ser: 0.77 mg/dL (ref 0.50–1.10)
Glucose, Bld: 98 mg/dL (ref 70–99)
Potassium: 3.7 mmol/L (ref 3.5–5.1)
SODIUM: 140 mmol/L (ref 135–145)

## 2014-09-12 LAB — URINALYSIS, ROUTINE W REFLEX MICROSCOPIC
Bilirubin Urine: NEGATIVE
Glucose, UA: NEGATIVE mg/dL
Hgb urine dipstick: NEGATIVE
KETONES UR: NEGATIVE mg/dL
LEUKOCYTES UA: NEGATIVE
Nitrite: NEGATIVE
PH: 6 (ref 5.0–8.0)
Specific Gravity, Urine: >1.03 — ABNORMAL HIGH (ref 1.005–1.030)
UROBILINOGEN UA: 0.2 mg/dL (ref 0.0–1.0)

## 2014-09-12 LAB — CBC WITH DIFFERENTIAL/PLATELET
Basophils Absolute: 0 10*3/uL (ref 0.0–0.1)
Basophils Relative: 0 % (ref 0–1)
EOS ABS: 0.2 10*3/uL (ref 0.0–0.7)
Eosinophils Relative: 1 % (ref 0–5)
HEMATOCRIT: 46.8 % — AB (ref 36.0–46.0)
Hemoglobin: 15.6 g/dL — ABNORMAL HIGH (ref 12.0–15.0)
LYMPHS ABS: 3.1 10*3/uL (ref 0.7–4.0)
Lymphocytes Relative: 22 % (ref 12–46)
MCH: 30.2 pg (ref 26.0–34.0)
MCHC: 33.3 g/dL (ref 30.0–36.0)
MCV: 90.7 fL (ref 78.0–100.0)
MONO ABS: 0.8 10*3/uL (ref 0.1–1.0)
MONOS PCT: 5 % (ref 3–12)
Neutro Abs: 9.9 10*3/uL — ABNORMAL HIGH (ref 1.7–7.7)
Neutrophils Relative %: 72 % (ref 43–77)
PLATELETS: 278 10*3/uL (ref 150–400)
RBC: 5.16 MIL/uL — ABNORMAL HIGH (ref 3.87–5.11)
RDW: 13.5 % (ref 11.5–15.5)
WBC: 13.9 10*3/uL — ABNORMAL HIGH (ref 4.0–10.5)

## 2014-09-12 LAB — URINE MICROSCOPIC-ADD ON

## 2014-09-12 LAB — CLOSTRIDIUM DIFFICILE BY PCR: CDIFFPCR: POSITIVE — AB

## 2014-09-12 LAB — PREGNANCY, URINE: Preg Test, Ur: NEGATIVE

## 2014-09-12 MED ORDER — VANCOMYCIN HCL 125 MG PO CAPS: 125.0000 mg | ORAL_CAPSULE | Freq: Four times a day (QID) | ORAL | Status: DC

## 2014-09-12 MED ORDER — SODIUM CHLORIDE 0.9 % IV BOLUS (SEPSIS)
1000.0000 mL | Freq: Once | INTRAVENOUS | Status: AC
  Administered 2014-09-12: 1000 mL via INTRAVENOUS

## 2014-09-12 MED ORDER — VANCOMYCIN 50 MG/ML ORAL SOLUTION: 125.0000 mg | Freq: Four times a day (QID) | ORAL | Status: DC

## 2014-09-12 NOTE — ED Notes (Signed)
Gave patient ginger ale to drink as requested and approved by MD. 

## 2014-09-12 NOTE — ED Provider Notes (Signed)
CSN: [id number]     Arrival date & time 09/12/14  1021 History  This chart was scribed for No att. providers found by Elveria Rising, ED scribe.  This patient was seen in room [facility]08/[facility]08 and the patient's care was started at 10:54 AM.   Chief Complaint  Patient presents with  . Diarrhea   The history is provided by the patient. No language interpreter was used.   HPI Comments: Kimberly Morgan is a 34 y.o. female who presents to the Emergency Department complaining of fever, nausea, abdominal pain, and diarrhea for four days. Patient reports burning abdominal pain with consumption of solids and liquids and diarrhea induced by eating. Patient reports development of erythematous, blotchy, burning, pruritic rash to her left body this morning at 3am. Patient denies chemical, plant, or insect exposure stating she been in the house since onset of her symptoms. Per chart history patient diagnosed with bacterial vaginosis 07/05/2014 at [facility] and discharged with Cleocin 100 mg, vaginal suppository, because she was breastfeeding at time. Patient reports phoning in an updated prescription when symptoms returned approximately two weeks ago. Patient reports development of yeast infection treated at home with Monistat, that she completed  three days ago. Patient shares that since having her child she has noticed changes in her vaginal pH and environment. Patient shares that her mother was recently diagnosed with C diff one week ago. Patient reports history of diarrhea status post cholecystectomy. Patient reports family history of cholecystectomy stating "we've all had our gall bladders removed." Patient is uncertain is the diarrhea her mother was experiencing before her diagnosis was consistent with her typical symptoms.      Past Medical History  Diagnosis Date  . Hypothyroidism   . Seasonal allergies   . UTI (lower urinary tract infection)   . Kidney stones   . Preterm labor   . 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  Hypothyroid 10/31/2012  . PONV (postoperative nausea and vomiting)    Past Surgical History  Procedure Laterality Date  . Cholecystectomy      laparoscopic- 2011  . Cervical cerclage  09/30/2011    Procedure: CERCLAGE CERVICAL;  Surgeon: [name], MD;  Location: [facility];  Service: Gynecology;  Laterality: N/A;  . Cervical cerclage N/A 12/01/2012    Procedure: [facility] CERCLAGE CERVICAL;  Surgeon: [name], MD;  Location: [facility];  Service: Gynecology;  Laterality: N/A;   Family History  Problem Relation Age of Onset  . COPD Mother   . Hypertension Sister   . Cancer Maternal Grandfather     lung  . Diabetes Paternal Grandfather    History  Substance Use Topics  . Smoking status: Former Smoker -- 0.25 packs/day for 10 years    Types: Cigarettes    Quit date: 07/24/2011  . Smokeless tobacco: Never Used  . 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  HENT: Negative for congestion and sore throat.   Eyes: Negative for pain.  Respiratory: Negative for shortness of breath and wheezing.   Cardiovascular: Negative for chest pain.  Gastrointestinal: Positive for nausea, abdominal pain and diarrhea. Negative for  vomiting.  Genitourinary: Negative for dysuria.  Musculoskeletal: Negative for neck pain.  Skin: Positive for color change and rash.  Neurological: Negative for headaches.      Allergies  Aspirin; Eggs or egg-derived products; Bactrim; Baker's yeast; and Latex  Home Medications   Prior to Admission medications   Medication Sig Start Date End Date Taking? Authorizing Provider  acetaminophen (TYLENOL) 325 MG tablet Take 650 mg by mouth every 6 (six) hours as needed for headache.    Yes Historical Provider, MD  levothyroxine (SYNTHROID, LEVOTHROID) 125 MCG tablet Take 1 tablet (125 mcg total) by mouth daily before breakfast. 04/22/14  Yes Lazaro ArmsLuther H Eure, MD  Prenatal Vit-Fe Fumarate-FA (PRENATAL MULTIVITAMIN) TABS tablet Take 1 tablet by mouth daily at 12 noon.   Yes Historical Provider, MD  clindamycin (CLEOCIN) 100 MG vaginal suppository Place 1 suppository (100 mg total) vaginally at bedtime. Patient taking differently: Place vaginally at bedtime.  08/02/14   Jacklyn ShellFrances Cresenzo-Dishmon, CNM  hydrocortisone (ANUSOL-HC) 2.5 % rectal cream Place rectally 3 (three) times daily. Patient not taking: Reported on 07/05/2014 05/23/13   Minta BalsamMichael R Odom, MD  vancomycin (VANCOCIN) 125 MG capsule Take 1 capsule (125 mg total) by mouth 4 (four) times daily. 09/12/14   Benjiman CoreNathan Sephira Zellman, MD  vancomycin (VANCOCIN) 50 mg/mL oral solution Take 2.5 mLs (125 mg total) by mouth every 6 (six) hours. 09/12/14   Benjiman CoreNathan Sora Vrooman, MD   Triage Vitals: BP 135/94 mmHg  Pulse 118  Temp(Src) 99.3 F (37.4 C) (Oral)  Resp 18  Ht 5\' 3"  (1.6 m)  Wt 202 lb (91.627 kg)  BMI 35.79 kg/m2  SpO2 99%  Breastfeeding? Yes Physical Exam  Constitutional: She is oriented to person, place, and time. She appears well-developed and well-nourished. No distress.  Appears well.   HENT:  Head: Normocephalic and atraumatic.  Cardiovascular: Normal rate and regular rhythm.   Pulmonary/Chest: Effort normal and breath sounds normal. No respiratory distress. She has no wheezes. She has no rales. She exhibits no tenderness.  Abdominal: There is no tenderness.  Hyperactive bowel sounds.   Neurological: She is alert and oriented to person, place, and time.  Skin: Skin is warm and dry. Rash noted. There is erythema.  Plaque like, red rash to left wrist, chest wall and breast, raised no fluctuance.   Psychiatric: She has a normal mood and affect. Her behavior is normal.  Nursing note and vitals reviewed.   ED Course   Procedures (including critical care time)  COORDINATION OF CARE: 11:19 AM- Discussed treatment plan with patient at bedside and patient agreed to plan.   Labs Review Labs Reviewed  CLOSTRIDIUM DIFFICILE BY PCR - Abnormal; Notable for the following:    C difficile by pcr POSITIVE (*)    All other components within normal limits  CBC WITH DIFFERENTIAL/PLATELET - Abnormal; Notable for the following:    WBC 13.9 (*)    RBC 5.16 (*)    Hemoglobin 15.6 (*)    HCT 46.8 (*)    Neutro Abs 9.9 (*)    All other components within normal limits  URINALYSIS, ROUTINE W REFLEX MICROSCOPIC - Abnormal; Notable for the following:    Specific Gravity, Urine >1.030 (*)    Protein, ur TRACE (*)    All other components within normal limits  BASIC METABOLIC PANEL  PREGNANCY, URINE  URINE MICROSCOPIC-ADD ON    Imaging Review No results found.   EKG Interpretation None      MDM   Final diagnoses:  Clostridium  difficile diarrhea    Patient with diarrhea with C. difficile contact. Found to be C. difficile positive. Does not appear overly dehydrated and is tolerated orals. Will discharge home with oral vancomycin. Will follow-up with gastroenterology.  I personally performed the services described in this documentation, which was scribed in my presence. The recorded information has been reviewed and is accurate.     Benjiman Core, MD 09/12/14 [phone]

## 2014-09-12 NOTE — Discharge Instructions (Signed)
Clostridium Difficile Infection °Clostridium difficile (C. difficile) is a bacteria found in the intestinal tract or colon. Under certain conditions, it causes diarrhea and sometimes severe disease. The severe form of the disease is known as pseudomembranous colitis (often called C. difficile colitis). This disease can damage the lining of the colon or cause the colon to become enlarged (toxic megacolon). °CAUSES °Your colon normally contains many different bacteria, including C. difficile. The balance of bacteria in your colon can change during illness. This is especially true when you take antibiotic medicine. Taking antibiotics may allow the C. difficile to grow, multiply excessively, and make a toxin that then causes illness. The elderly and people with certain medical conditions have a greater risk of getting C. difficile infections. °SYMPTOMS °· Watery diarrhea. °· Fever. °· Fatigue. °· Loss of appetite. °· Nausea. °· Abdominal swelling, pain, or tenderness. °· Dehydration. °DIAGNOSIS °Your symptoms may make your caregiver suspect a C. difficile infection, especially if you have used antibiotics in the preceding weeks. However, there are only 2 ways to know for certain whether you have a C. difficile infection: °· A lab test that finds the toxin in your stool. °· The specific appearance of an abnormality (pseudomembrane) in your colon. This can only be seen by doing a sigmoidoscopy or colonoscopy. These procedures involve passing an instrument through your rectum to look at the inside of your colon. °Your caregiver will help determine if these tests are necessary. °TREATMENT °· Most people are successfully treated with one of two specific antibiotics, usually given by mouth. Other antibiotics you are receiving are stopped if possible. °· Intravenous (IV) fluids and correction of electrolyte imbalance may be necessary. °· Rarely, surgery may be needed to remove the infected part of the intestines. °· Careful  hand washing by you and your caregivers is important to prevent the spread of infection. In the hospital, your caregivers may also put on gowns and gloves to prevent the spread of the C. difficile bacteria. Your room is also cleaned regularly with a solution containing bleach or a product that is known to kill C. difficile. °HOME CARE INSTRUCTIONS °· Drink enough fluids to keep your urine clear or pale yellow. Avoid milk, caffeine, and alcohol. °· Ask your caregiver for specific rehydration instructions. °· Try eating small, frequent meals rather than large meals. °· Take your antibiotics as directed. Finish them even if you start to feel better. °· Do not use medicines to slow diarrhea. This could delay healing or cause complications. °· Wash your hands thoroughly after using the bathroom and before preparing food. °· Make sure people who live with you wash their hands often, too. °· Carefully disinfect all surfaces with a product that contains chlorine bleach. °SEEK MEDICAL CARE IF: °· Diarrhea persists longer than expected or recurs after completing your course of antibiotic treatment for the C. difficile infection. °· You have trouble staying hydrated. °SEEK IMMEDIATE MEDICAL CARE IF: °· You develop a new fever. °· You have increasing abdominal pain or tenderness. °· There is blood in your stools, or your stools are dark black and tarry. °· You cannot hold down food or liquids. °MAKE SURE YOU: °· Understand these instructions. °· Will watch your condition. °· Will get help right away if you are not doing well or get worse. °Document Released: 02/18/2005 Document Revised: 09/25/2013 Document Reviewed: 10/17/2010 °ExitCare® Patient Information ©2015 [employer]. This information is not intended to replace advice given to you by your health care provider. Make sure you   discuss any questions you have with your health care provider. ° °

## 2014-09-12 NOTE — ED Notes (Signed)
Pt reports nausea, fever, and diarrhea since Sunday.  Reports 2 weeks ago was diagnosed with bacterial vaginosis and was given an antibiotic via suppository.  Reports then got a yeast infection and has taken monistat x 3 days.  Pt breastfeeding.

## 2014-09-12 NOTE — ED Notes (Signed)
CRITICAL VALUE ALERT  Critical value received:  C-Diff Positive  Date of notification:  09/12/2014  Time of notification:  1310  Critical value read back: yes  Nurse who received alert:  LJS  MD notified (1st page):  Dr Rubin PayorPickering  Time of first page:  1310  MD notified (2nd page):  Time of second page:  Responding MD:  Dr Rubin PayorPickering  Time MD responded:  1310

## 2014-09-12 NOTE — ED Notes (Signed)
Pt reports her mother recently diagnosed with c diff and they work together.

## 2014-11-16 ENCOUNTER — Telehealth: Payer: Self-pay | Admitting: Obstetrics & Gynecology

## 2014-11-16 ENCOUNTER — Other Ambulatory Visit: Payer: Self-pay | Admitting: Obstetrics & Gynecology

## 2014-11-16 MED ORDER — FLUCONAZOLE 150 MG PO TABS: 150.0000 mg | ORAL_TABLET | Freq: Once | ORAL | Status: DC

## 2015-02-05 ENCOUNTER — Telehealth: Payer: Self-pay | Admitting: Obstetrics & Gynecology

## 2015-02-05 MED ORDER — FLUCONAZOLE 150 MG PO TABS: 150.0000 mg | ORAL_TABLET | Freq: Once | ORAL | Status: DC

## 2015-02-05 NOTE — Telephone Encounter (Signed)
Requesting Rx for Diflucan for yeast infection.

## 2015-05-14 IMAGING — US US OB LIMITED
1 series · 11 of 11 positions shown · non-contrast
Comparison: none

CLINICAL DATA: No heart rate obtainable post cerclage surgery.

[Series 1: us ob limited · 0.33mm/px · 11 acquisitions, 11 frames shown]
[im 1/11]
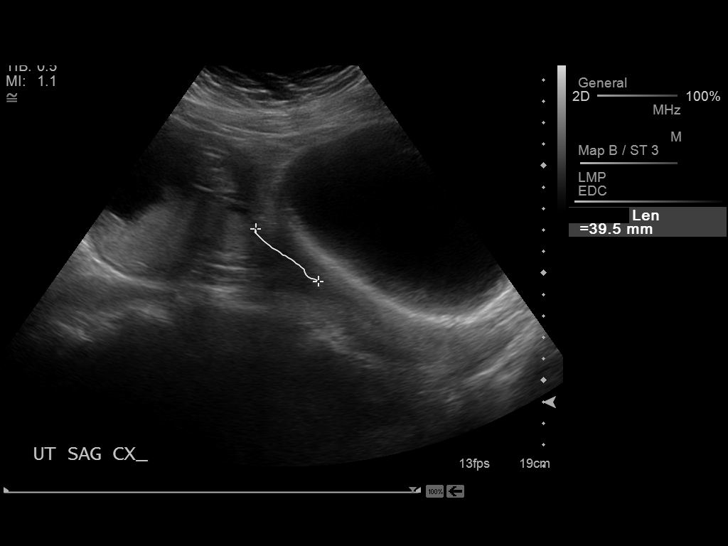
[im 2/11]
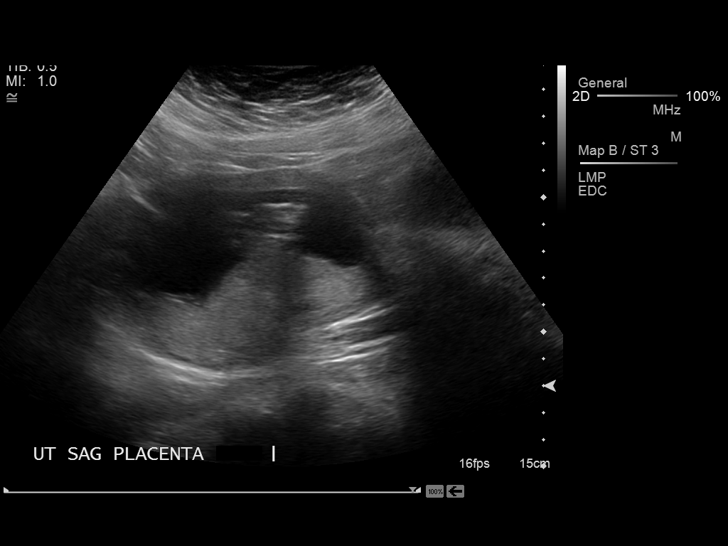
[im 3/11]
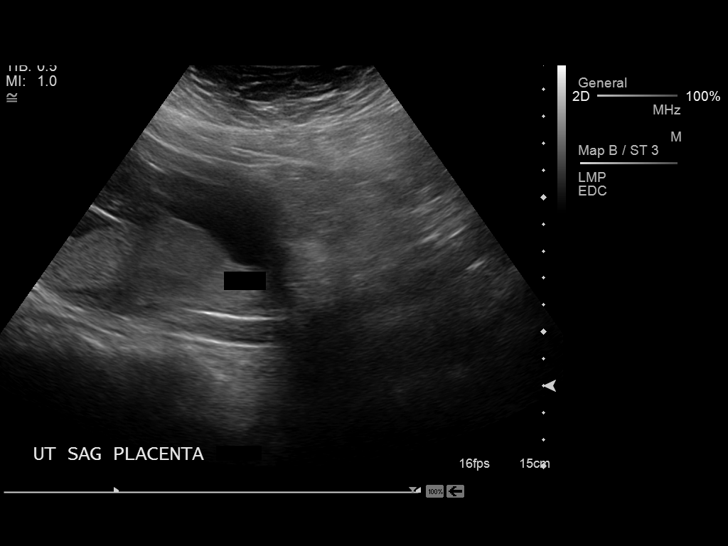
[im 4/11]
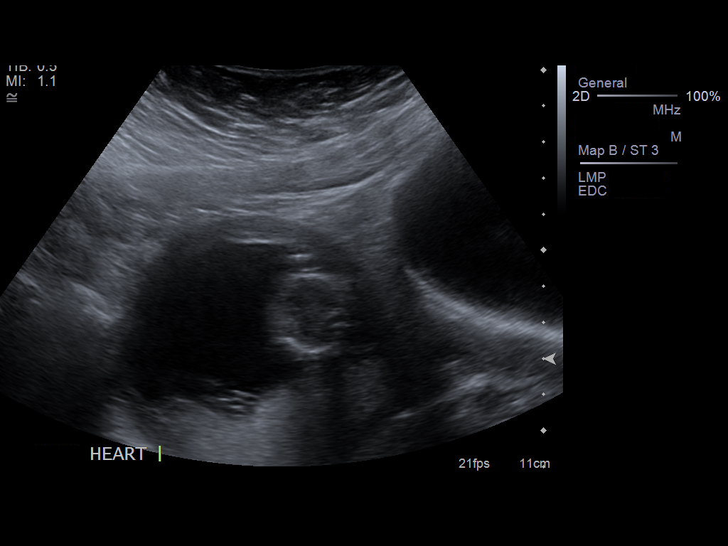
[im 5/11]
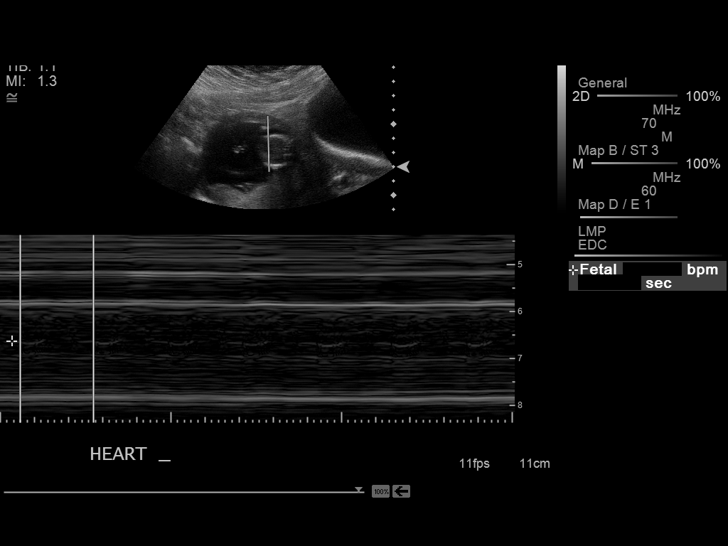
[im 6/11]
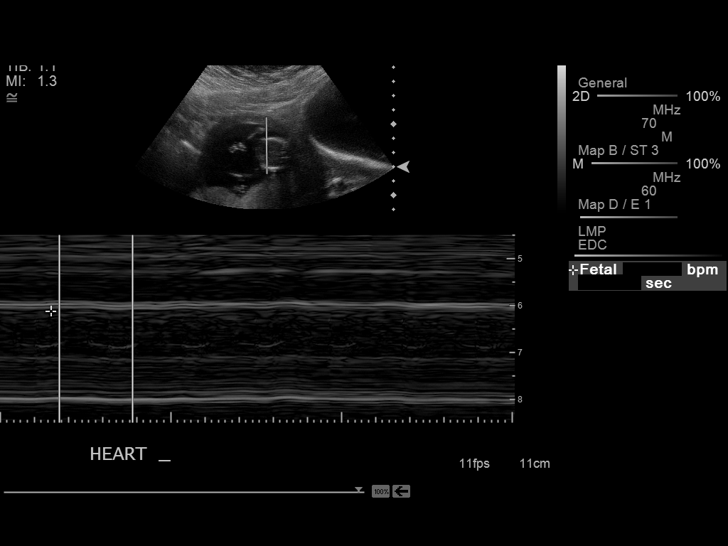
[im 7/11]
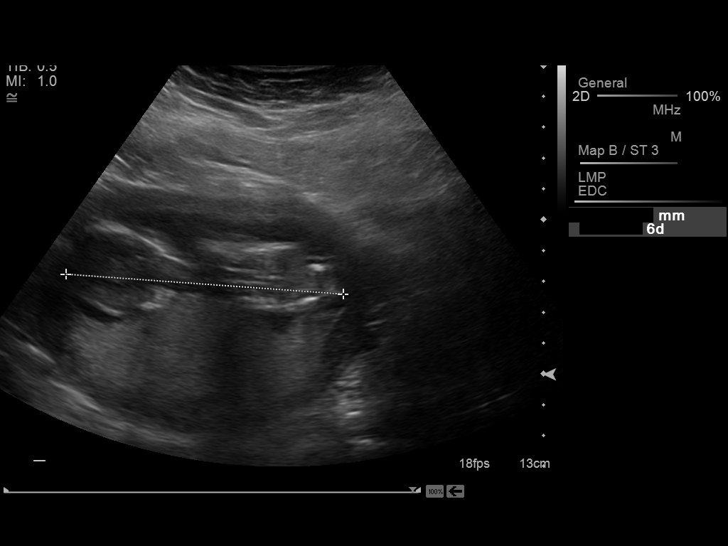
[im 8/11]
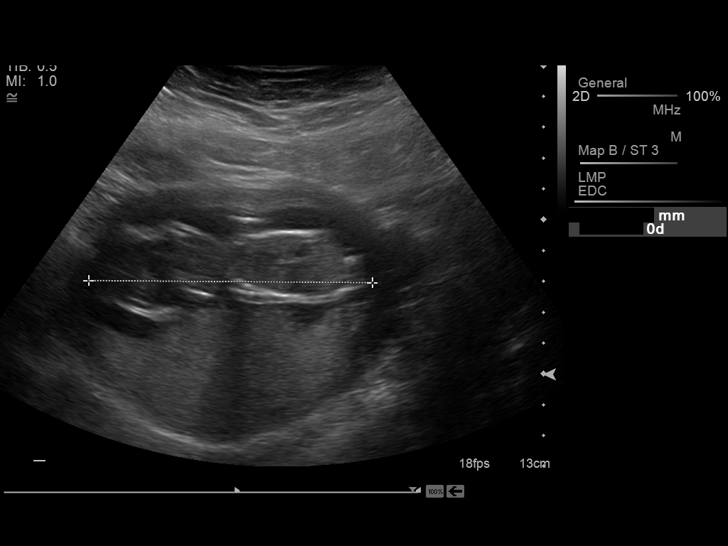
[im 9/11]
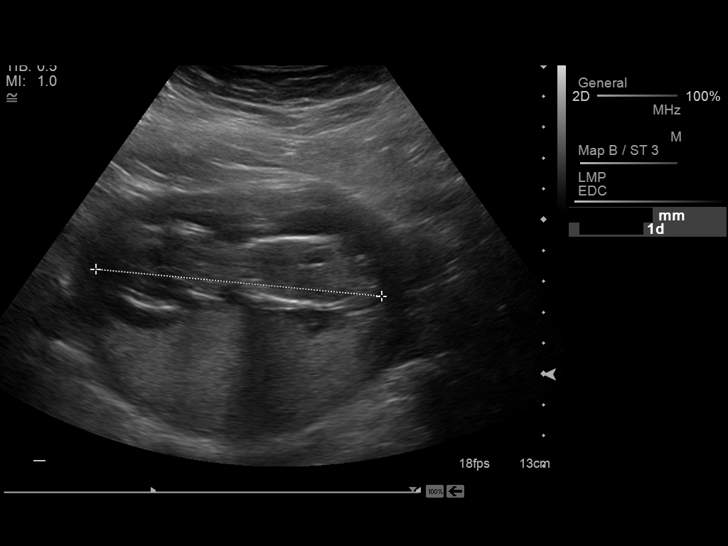
[im 10/11]
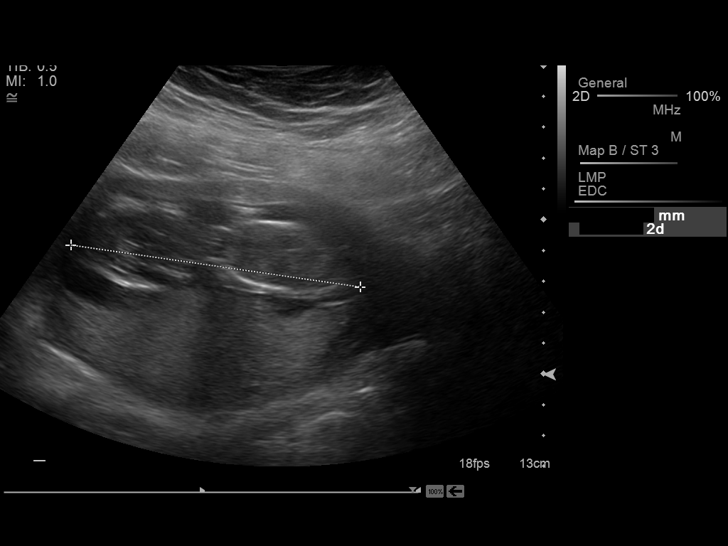
[im 11/11]
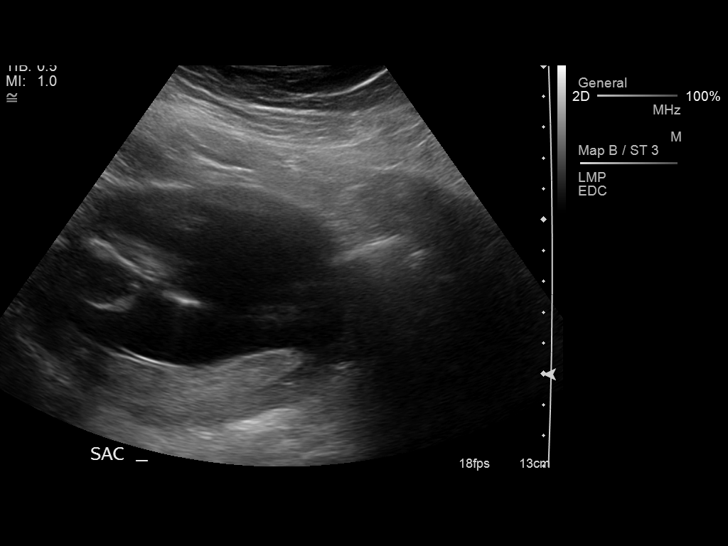

[11 of 11 positions shown; findings below may reference images not displayed]

LIMITED OBSTETRIC ULTRASOUND

Number of Fetuses: 1
Heart Rate: 140 bpm
Movement: Seen
Breathing: Not seen
Presentation: Cephalic
Placental Location: Posterior
Previa:
Amniotic Fluid (Subjective): Normal

Vertical Pocket 4.9 cm     AFI  cm  (5%ile =  cm; 95%ile =  cm)

Crown-rump length: 94.8 mm = 15w 2d

MATERNAL FINDINGS:
Cervix: Not assessed
Uterus/Adnexae:  Not assessed
IMPRESSION: Evaluation for fetal cardiac activity demonstrates a normal rate of
140 beats per minute.

Subjectively normal amniotic fluid volume.

Recommend followup with non-emergent complete OB 14+ wk US
examination for fetal biometric evaluation and anatomic survey if
not already performed.

## 2015-05-28 ENCOUNTER — Ambulatory Visit (INDEPENDENT_AMBULATORY_CARE_PROVIDER_SITE_OTHER): Payer: BC Managed Care – PPO | Admitting: Obstetrics & Gynecology

## 2015-05-28 ENCOUNTER — Other Ambulatory Visit (HOSPITAL_COMMUNITY)
Admission: RE | Admit: 2015-05-28 | Discharge: 2015-05-28 | Disposition: A | Discharge status: Home / Self Care | Payer: BC Managed Care – PPO | Source: Ambulatory Visit | Attending: Obstetrics & Gynecology | Admitting: Obstetrics & Gynecology

## 2015-05-28 ENCOUNTER — Encounter: Payer: Self-pay | Admitting: Obstetrics & Gynecology

## 2015-05-28 VITALS — BP 120/80 | HR 76 | Ht 63.0 in | Wt 212.0 lb

## 2015-05-28 DIAGNOSIS — Z01419 Encounter for gynecological examination (general) (routine) without abnormal findings: Secondary | ICD-10-CM | POA: Diagnosis not present

## 2015-05-28 NOTE — Progress Notes (Signed)
Patient ID: Kimberly Morgan, female   DOB: 04-Aug-1980, 35 y.o.   MRN: [id number] Subjective:     Kimberly Morgan is a 35 y.o. female here for a routine exam.  Patient's last menstrual period was 05/09/2014. [id number] Birth Control Method:  none Menstrual Calendar(currently): regular  Current complaints: none.   Current acute medical issues:  none   Recent Gynecologic History Patient's last menstrual period was 05/09/2014. Last Pap: 2015,  normal Last mammogram: ,    Past Medical History  Diagnosis Date  . Hypothyroidism   . Seasonal allergies   . UTI (lower urinary tract infection)   . Kidney stones   . Preterm labor   . 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 Hypothyroid 10/31/2012  . PONV (postoperative nausea and vomiting)     Past Surgical History  Procedure Laterality Date  . Cholecystectomy      laparoscopic- 2011  . Cervical cerclage  09/30/2011    Procedure: CERCLAGE CERVICAL;  Surgeon: [name], MD;  Location: [facility];  Service: Gynecology;  Laterality: N/A;  . 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 Marital Status: Married    Spouse Name: N/A  . Number of Children: N/A  . Years of Education: N/A   Social History Main Topics  . Smoking status: Former Smoker -- 0.25 packs/day for 10 years    Types: Cigarettes    Quit date: 07/24/2011  . Smokeless tobacco: Never Used  . Alcohol Use: No  . Drug Use: No  . Sexual Activity: Yes    Birth Control/ Protection: None   Other Topics Concern  . None   Social History Narrative    Family History  Problem  Relation Age of Onset  . COPD Mother   . Hypertension Sister   . Cancer Maternal Grandfather     lung  . Diabetes Paternal Grandfather      Current outpatient prescriptions:  .  levothyroxine (SYNTHROID, LEVOTHROID) 125 MCG tablet, Take 1 tablet (125 mcg total) by mouth daily before breakfast., Disp: 30 tablet, Rfl: 11 .  vancomycin (VANCOCIN) 50 mg/mL oral solution, Take 2.5 mLs (125 mg total) by mouth every 6 (six) hours., Disp: 100 mL, Rfl: 0 .  clindamycin (CLEOCIN) 100 MG vaginal suppository, Place 1 suppository (100 mg total) vaginally at bedtime. (Patient taking differently: Place vaginally at bedtime. ), Disp: 3 suppository, Rfl: 2 .  hydrocortisone (ANUSOL-HC) 2.5 % rectal cream, Place rectally 3 (three) times daily. (Patient not taking: Reported on 07/05/2014), Disp: 30 g, Rfl: 1  Review of Systems  Review of Systems  Constitutional: Negative for fever, chills, weight loss, malaise/fatigue and diaphoresis.  HENT: Negative for hearing loss, ear pain, nosebleeds, congestion, sore throat, neck pain, tinnitus and ear discharge.   Eyes: Negative for blurred vision, double vision, photophobia, pain, discharge and redness.  Respiratory: Negative for cough, hemoptysis, sputum production, shortness of breath, wheezing and stridor.   Cardiovascular: Negative for chest pain, palpitations, orthopnea, claudication, leg swelling  and PND.  Gastrointestinal: negative for abdominal pain. Negative for heartburn, nausea, vomiting, diarrhea, constipation, blood in stool and melena.  Genitourinary: Negative for dysuria, urgency, frequency, hematuria and flank pain.  Musculoskeletal: Negative for myalgias, back pain, joint pain and falls.  Skin: Negative for itching and rash.  Neurological: Negative for dizziness, tingling, tremors, sensory change, speech change, focal weakness, seizures, loss of consciousness, weakness and headaches.  Endo/Heme/Allergies: Negative for environmental allergies and  polydipsia. Does not bruise/bleed easily.  Psychiatric/Behavioral: Negative for depression, suicidal ideas, hallucinations, memory loss and substance abuse. The patient is not nervous/anxious and does not have insomnia.        Objective:  Blood pressure 120/80, pulse 76, height 5\' 3"  (1.6 m), weight 212 lb (96.163 kg), last menstrual period 05/09/2014, currently breastfeeding.   Physical Exam  Vitals reviewed. Constitutional: She is oriented to person, place, and time. She appears well-developed and well-nourished.  HENT:  Head: Normocephalic and atraumatic.        Right Ear: External ear normal.  Left Ear: External ear normal.  Nose: Nose normal.  Mouth/Throat: Oropharynx is clear and moist.  Eyes: Conjunctivae and EOM are normal. Pupils are equal, round, and reactive to light. Right eye exhibits no discharge. Left eye exhibits no discharge. No scleral icterus.  Neck: Normal range of motion. Neck supple. No tracheal deviation present. No thyromegaly present.  Cardiovascular: Normal rate, regular rhythm, normal heart sounds and intact distal pulses.  Exam reveals no gallop and no friction rub.   No murmur heard. Respiratory: Effort normal and breath sounds normal. No respiratory distress. She has no wheezes. She has no rales. She exhibits no tenderness.  GI: Soft. Bowel sounds are normal. She exhibits no distension and no mass. There is no tenderness. There is no rebound and no guarding.  Genitourinary:  Breasts no masses skin changes or nipple changes bilaterally      Vulva is normal without lesions Vagina is pink moist without discharge Cervix normal in appearance and pap is done Uterus is normal size shape and contour Adnexa is negative with normal sized ovaries   Musculoskeletal: Normal range of motion. She exhibits no edema and no tenderness.  Neurological: She is alert and oriented to person, place, and time. She has normal reflexes. She displays normal reflexes. No cranial nerve  deficit. She exhibits normal muscle tone. Coordination normal.  Skin: Skin is warm and dry. No rash noted. No erythema. No pallor.  Psychiatric: She has a normal mood and affect. Her behavior is normal. Judgment and thought content normal.       Assessment:    Healthy female exam.    Plan:    Contraception: none.    Follow up when pregnant

## 2015-05-30 LAB — CYTOLOGY - PAP

## 2015-09-29 IMAGING — US US RENAL
1 series · 14 of 25 positions shown · non-contrast
Comparison: None.

CLINICAL DATA: Hematuria.  Left low back pain.  Pregnant patient.

EXAM:
RENAL/URINARY TRACT ULTRASOUND COMPLETE

[Series 1: us renal · 14 of 34 slices shown]
[im 1/34]
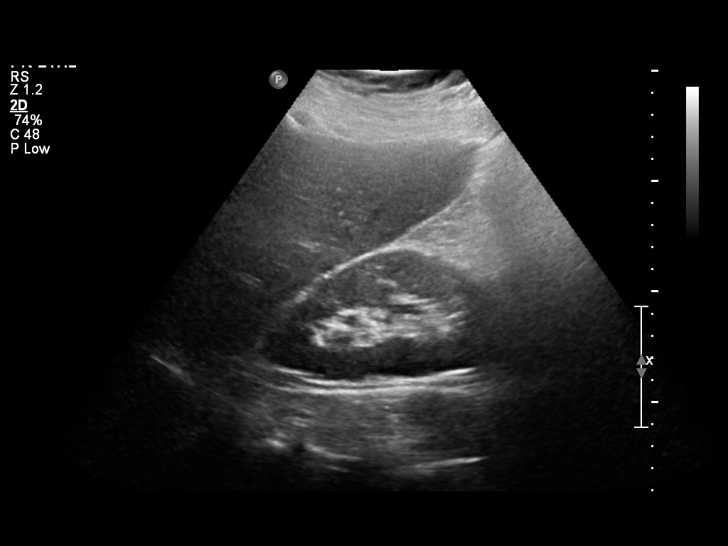
[im 3/34]
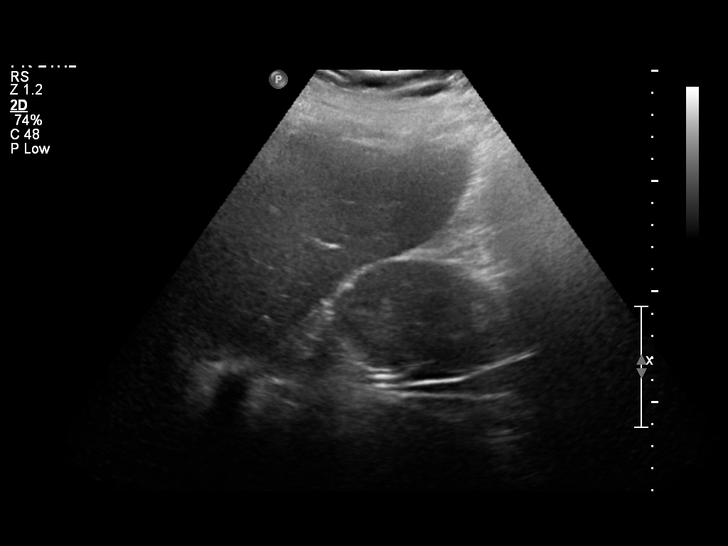
[im 6/34]
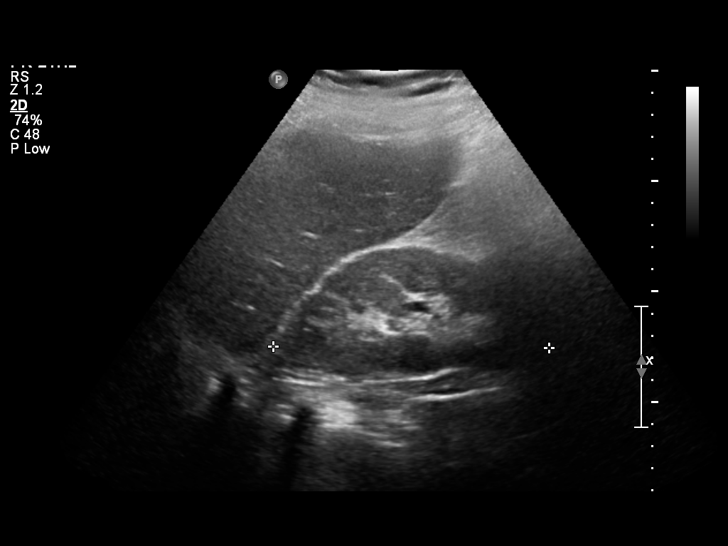
[im 9/34]
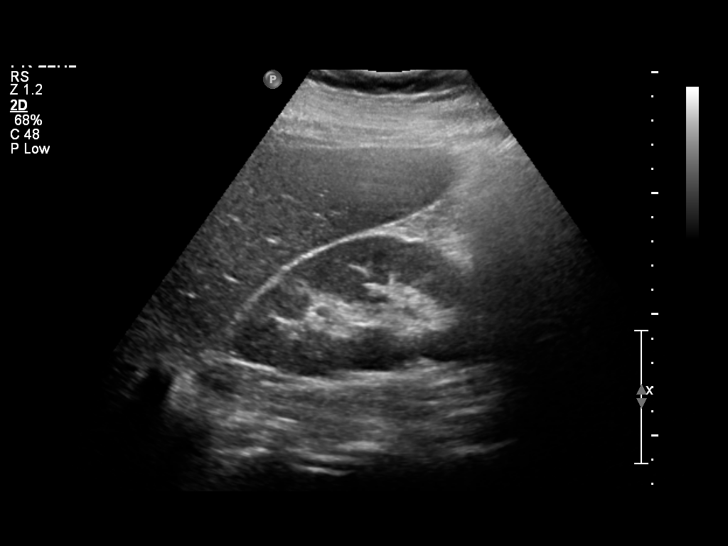
[im 12/34]
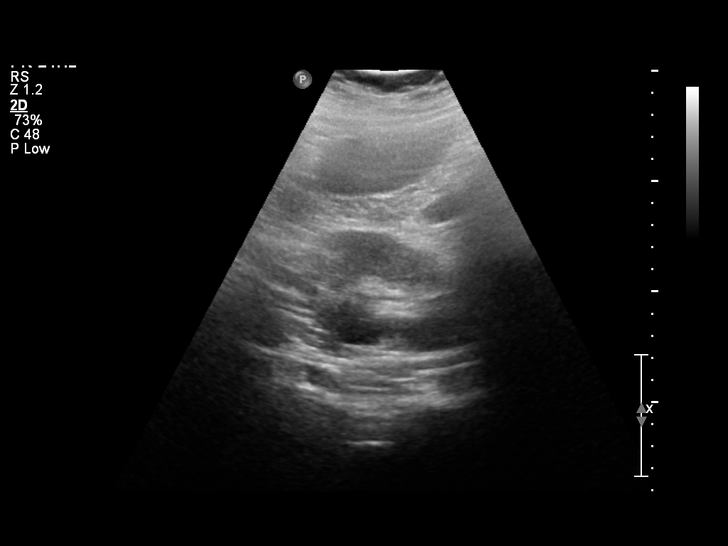
[im 13/34]
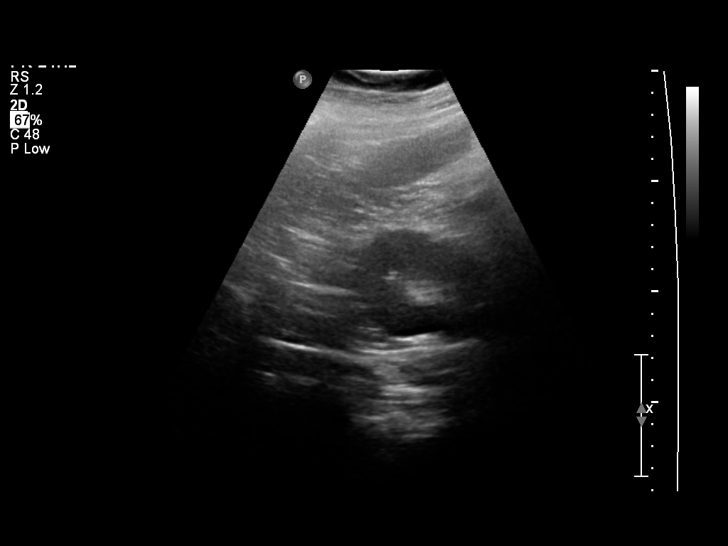
[im 16/34]
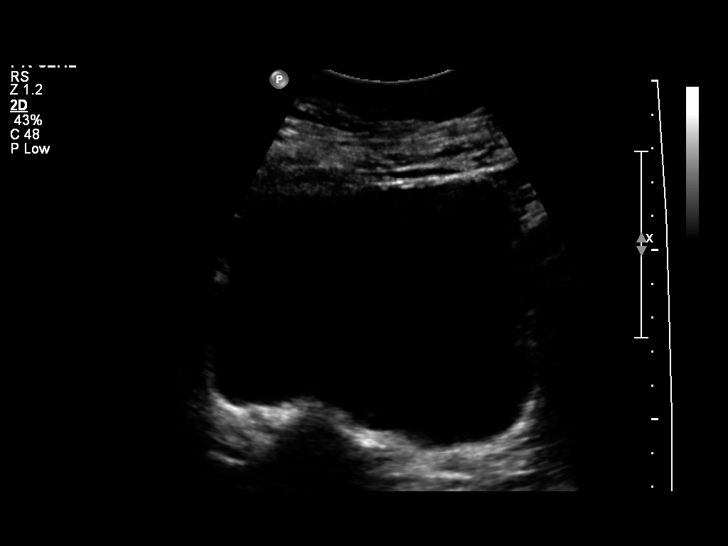
[im 18/34]
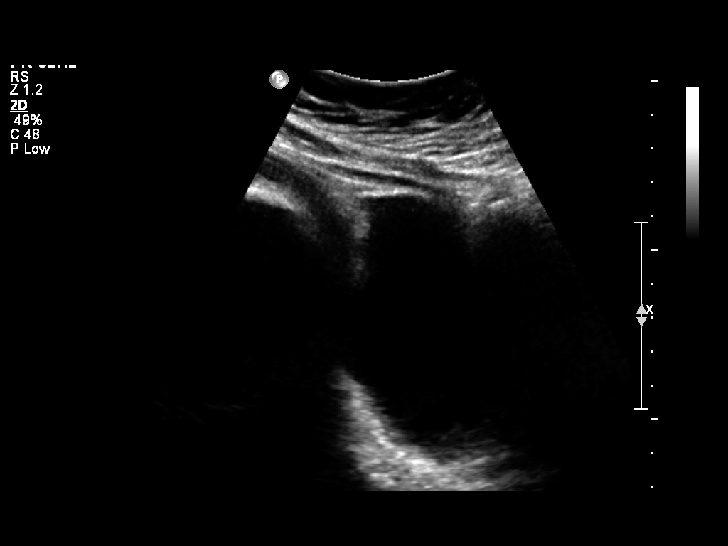
[im 21/34]
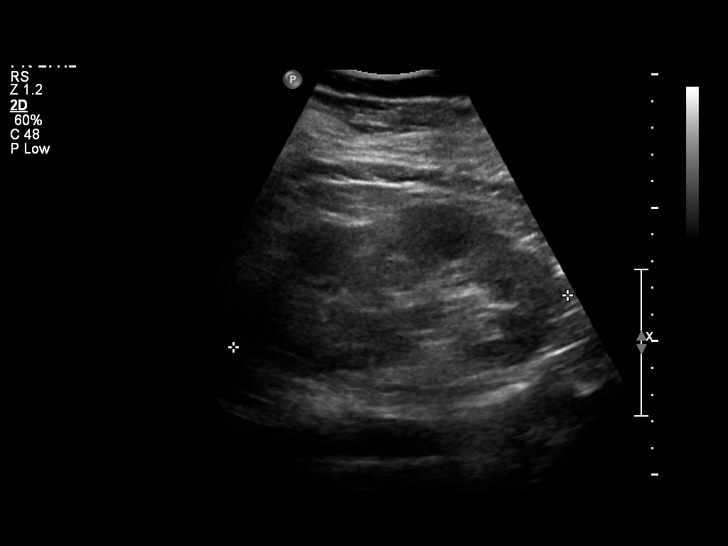
[im 23/34]
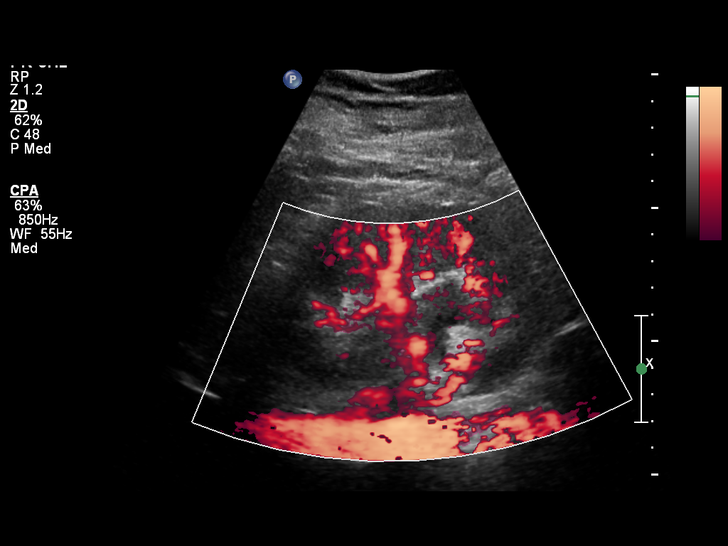
[im 25/34]
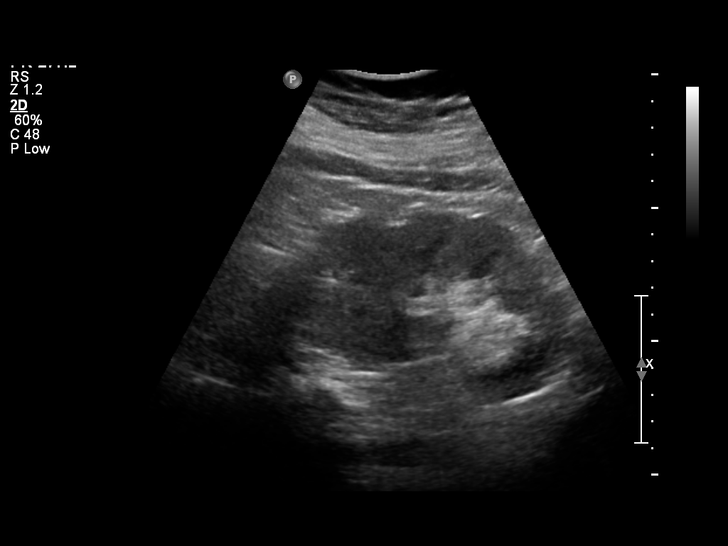
[im 28/34]
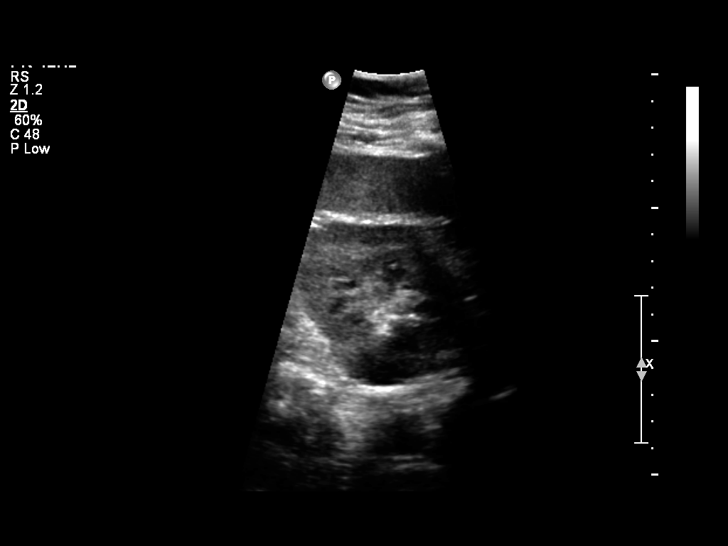
[im 31/34]
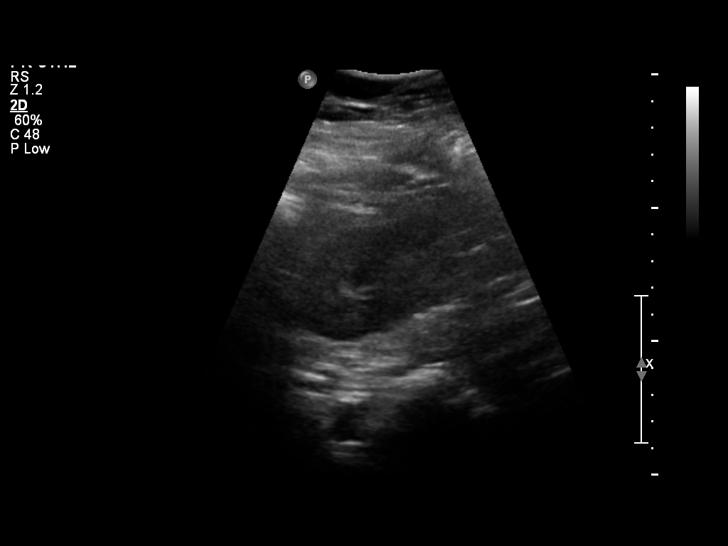
[im 34/34]
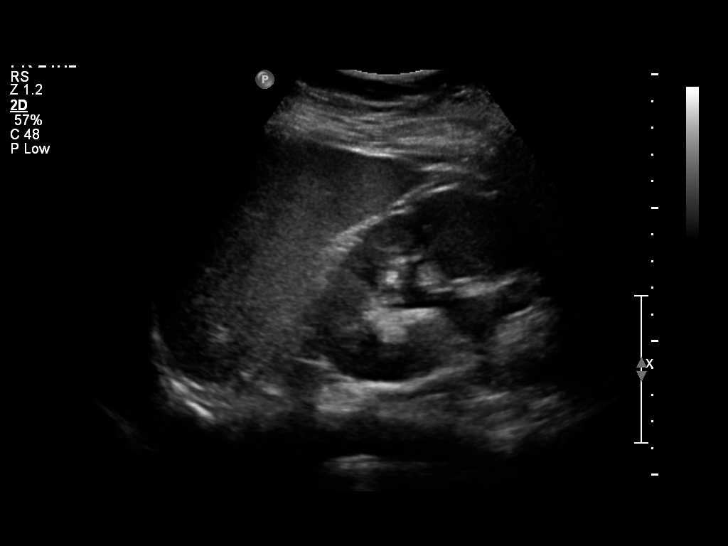

[14 of 25 positions shown; findings below may reference images not displayed]

FINDINGS: Right Kidney

Length: 12.5 cm. Echogenicity within normal limits. No mass or
hydronephrosis visualized.

Left Kidney

Length: 12.6 cm. Mild left hydronephrosis is identified. No stone or
mass is seen.

Bladder

Urinary bladder is unremarkable.  Fetus is partially visualized.
IMPRESSION: Mild left hydronephrosis.  Otherwise negative.

## 2015-10-29 ENCOUNTER — Ambulatory Visit (INDEPENDENT_AMBULATORY_CARE_PROVIDER_SITE_OTHER): Payer: BC Managed Care – PPO | Admitting: Adult Health

## 2015-10-29 ENCOUNTER — Encounter: Payer: Self-pay | Admitting: Adult Health

## 2015-10-29 VITALS — BP 110/60 | HR 91

## 2015-10-29 DIAGNOSIS — O09811 Supervision of pregnancy resulting from assisted reproductive technology, first trimester: Secondary | ICD-10-CM

## 2015-10-29 DIAGNOSIS — O0993 Supervision of high risk pregnancy, unspecified, third trimester: Secondary | ICD-10-CM | POA: Insufficient documentation

## 2015-10-29 DIAGNOSIS — Z9889 Other specified postprocedural states: Secondary | ICD-10-CM

## 2015-10-29 DIAGNOSIS — Z1389 Encounter for screening for other disorder: Secondary | ICD-10-CM

## 2015-10-29 DIAGNOSIS — O09291 Supervision of pregnancy with other poor reproductive or obstetric history, first trimester: Secondary | ICD-10-CM

## 2015-10-29 DIAGNOSIS — Z369 Encounter for antenatal screening, unspecified: Secondary | ICD-10-CM

## 2015-10-29 DIAGNOSIS — Z331 Pregnant state, incidental: Secondary | ICD-10-CM

## 2015-10-29 DIAGNOSIS — O09899 Supervision of other high risk pregnancies, unspecified trimester: Secondary | ICD-10-CM

## 2015-10-29 DIAGNOSIS — E039 Hypothyroidism, unspecified: Secondary | ICD-10-CM

## 2015-10-29 DIAGNOSIS — O09299 Supervision of pregnancy with other poor reproductive or obstetric history, unspecified trimester: Secondary | ICD-10-CM

## 2015-10-29 DIAGNOSIS — E079 Disorder of thyroid, unspecified: Secondary | ICD-10-CM

## 2015-10-29 DIAGNOSIS — O09891 Supervision of other high risk pregnancies, first trimester: Secondary | ICD-10-CM

## 2015-10-29 HISTORY — DX: Supervision of other high risk pregnancies, unspecified trimester: O09.899

## 2015-10-29 HISTORY — DX: Supervision of pregnancy with other poor reproductive or obstetric history, unspecified trimester: O09.299

## 2015-10-29 LAB — POCT URINALYSIS DIPSTICK
Blood, UA: NEGATIVE
GLUCOSE UA: NEGATIVE
Ketones, UA: NEGATIVE
Leukocytes, UA: NEGATIVE
Nitrite, UA: NEGATIVE
Protein, UA: NEGATIVE

## 2015-10-29 NOTE — Progress Notes (Signed)
Subjective:  Kimberly Morgan is a 35 y.o. G39P1101 Caucasian female at [redacted]w[redacted]d by [place] being seen today for her first obstetrical visit.  Her obstetrical history is significant for had IVF, history of preterm labor, and had cerclage with last pregnancy and delivered at 38+ weeks..  Pregnancy history fully reviewed.She is on progesterone injections til 10 weeks.  Patient reports has some cramping  and has some hemorrhoids. Denies vb,  uti s/s, abnormal/malodorous vag d/c, or vulvovaginal itching/irritation.  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 Hypothyroidism   . Seasonal allergies   . UTI (lower urinary tract infection)   . Kidney stones   . Preterm labor   . 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 Hypothyroid 10/31/2012  . PONV (postoperative nausea and vomiting)   . Supervision of other high-risk pregnancy 10/29/2015     [facility]iated Care at     9+3 weeks FOB  Rudell Cobb Dating By  [place] Pap  05/28/15 negative GC/CT Initial:                36+wks: Genetic Screen NT/IT:  CF screen  Anatomic [place]  Flu vaccine  Tdap Recommended ~ 28wks Glucose Screen  2 hr GBS  Feed Preference  Contraception  Circumcision  Childbirth Classes  Pediatrician     Past Surgical History  Procedure Laterality Date  . Cholecystectomy      laparoscopic- 2011  . Cervical cerclage  09/30/2011    Procedure: CERCLAGE CERVICAL;  Surgeon: [name], MD;  Location: [facility];  Service: Gynecology;  Laterality: N/A;  . Cervical cerclage N/A 12/01/2012    Procedure: [facility] CERCLAGE CERVICAL;  Surgeon: [name], MD;  Location: [facility];  Service: Gynecology;  Laterality: N/A;   Family History  Problem Relation Age of Onset  . COPD Mother   . Hypertension Sister   . Cancer Maternal Grandfather     lung  . Diabetes Paternal Grandfather     Exam   System:     General: Well developed & nourished, no acute distress   Skin: Warm & dry, normal coloration and turgor, no rashes   Neurologic: Alert & oriented, normal mood   Cardiovascular: Regular rate & rhythm   Respiratory: Effort & rate normal, LCTAB, acyanotic   Abdomen: Soft, non tender   Extremities: normal strength, tone   Pelvic Exam:    Perineum: deferred   Vulva: deferred   Vagina:  deferred   Cervix: deferred   Uterus: deferred   Thin prep pap smear, had 05/28/15 was negative FHR: 177 via doppler   Assessment:   Pregnancy: [id number] Patient Active Problem List   Diagnosis Date Noted  . Supervision of other high-risk pregnancy 10/29/2015  .  Vaginal delivery 05/23/2013  . PROM (premature rupture of membranes) 05/21/2013  . Renal calculus or stone 05/15/2013  . Hypothyroidism in pregnancy, antepartum 02/04/2013  . Supervision of high-risk pregnancy 01/05/2013  . Pregnancy resulting from in vitro fertilization 11/22/2012  . Hypothyroid 10/31/2012    4236w3d G3P1101 New OB visit     Plan:  Initial labs drawn Continue prenatal vitamins Problem list reviewed and updated Reviewed n/v relief measures and warning s/s to report Reviewed recommended weight gain based on pre-gravid BMI Encouraged well-balanced diet Genetic Screening discussed Integrated Screen and Quad Screen: requested Cystic fibrosis screening discussed declined Ultrasound discussed; fetal survey: requested  Will check TSH today and needs checked every trimester  Follow up in 3 weeks for IT/NT and see Dr Emelda FearFerguson  to discuss cerclage and see Dr Emelda FearFerguson in 1 week to discuss progesterone   Adline PotterJennifer A. Autum Benfer, NP 10/29/2015 3:09 PM

## 2015-10-29 NOTE — Patient Instructions (Addendum)
First Trimester of Pregnancy The first trimester of pregnancy is from week 1 until the end of week 12 (months 1 through 3). A week after a sperm fertilizes an egg, the egg will implant on the wall of the uterus. This embryo will begin to develop into a baby. Genes from you and your partner are forming the baby. The female genes determine whether the baby is a boy or a girl. At 6-8 weeks, the eyes and face are formed, and the heartbeat can be seen on ultrasound. At the end of 12 weeks, all the baby's organs are formed.  Now that you are pregnant, you will want to do everything you can to have a healthy baby. Two of the most important things are to get good prenatal care and to follow your health care provider's instructions. Prenatal care is all the medical care you receive before the baby's birth. This care will help prevent, find, and treat any problems during the pregnancy and childbirth. BODY CHANGES Your body goes through many changes during pregnancy. The changes vary from woman to woman.   You may gain or lose a couple of pounds at first.  You may feel sick to your stomach (nauseous) and throw up (vomit). If the vomiting is uncontrollable, call your health care provider.  You may tire easily.  You may develop headaches that can be relieved by medicines approved by your health care provider.  You may urinate more often. Painful urination may mean you have a bladder infection.  You may develop heartburn as a result of your pregnancy.  You may develop constipation because certain hormones are causing the muscles that push waste through your intestines to slow down.  You may develop hemorrhoids or swollen, bulging veins (varicose veins).  Your breasts may begin to grow larger and become tender. Your nipples may stick out more, and the tissue that surrounds them (areola) may become darker.  Your gums may bleed and may be sensitive to brushing and flossing.  Dark spots or blotches (chloasma,  mask of pregnancy) may develop on your face. This will likely fade after the baby is born.  Your menstrual periods will stop.  You may have a loss of appetite.  You may develop cravings for certain kinds of food.  You may have changes in your emotions from day to day, such as being excited to be pregnant or being concerned that something may go wrong with the pregnancy and baby.  You may have more vivid and strange dreams.  You may have changes in your hair. These can include thickening of your hair, rapid growth, and changes in texture. Some women also have hair loss during or after pregnancy, or hair that feels dry or thin. Your hair will most likely return to normal after your baby is born. WHAT TO EXPECT AT YOUR PRENATAL VISITS During a routine prenatal visit:  You will be weighed to make sure you and the baby are growing normally.  Your blood pressure will be taken.  Your abdomen will be measured to track your baby's growth.  The fetal heartbeat will be listened to starting around week 10 or 12 of your pregnancy.  Test results from any previous visits will be discussed. Your health care provider may ask you:  How you are feeling.  If you are feeling the baby move.  If you have had any abnormal symptoms, such as leaking fluid, bleeding, severe headaches, or abdominal cramping.  If you are using any tobacco products,   including cigarettes, chewing tobacco, and electronic cigarettes.  If you have any questions. Other tests that may be performed during your first trimester include:  Blood tests to find your blood type and to check for the presence of any previous infections. They will also be used to check for low iron levels (anemia) and Rh antibodies. Later in the pregnancy, blood tests for diabetes will be done along with other tests if problems develop.  Urine tests to check for infections, diabetes, or protein in the urine.  An ultrasound to confirm the proper growth  and development of the baby.  An amniocentesis to check for possible genetic problems.  Fetal screens for spina bifida and Down syndrome.  You may need other tests to make sure you and the baby are doing well.  HIV (human immunodeficiency virus) testing. Routine prenatal testing includes screening for HIV, unless you choose not to have this test. HOME CARE INSTRUCTIONS  Medicines  Follow your health care provider's instructions regarding medicine use. Specific medicines may be either safe or unsafe to take during pregnancy.  Take your prenatal vitamins as directed.  If you develop constipation, try taking a stool softener if your health care provider approves. Diet  Eat regular, well-balanced meals. Choose a variety of foods, such as meat or vegetable-based protein, fish, milk and low-fat dairy products, vegetables, fruits, and whole grain breads and cereals. Your health care provider will help you determine the amount of weight gain that is right for you.  Avoid raw meat and uncooked cheese. These carry germs that can cause birth defects in the baby.  Eating four or five small meals rather than three large meals a day may help relieve nausea and vomiting. If you start to feel nauseous, eating a few soda crackers can be helpful. Drinking liquids between meals instead of during meals also seems to help nausea and vomiting.  If you develop constipation, eat more high-fiber foods, such as fresh vegetables or fruit and whole grains. Drink enough fluids to keep your urine clear or pale yellow. Activity and Exercise  Exercise only as directed by your health care provider. Exercising will help you:  Control your weight.  Stay in shape.  Be prepared for labor and delivery.  Experiencing pain or cramping in the lower abdomen or low back is a good sign that you should stop exercising. Check with your health care provider before continuing normal exercises.  Try to avoid standing for long  periods of time. Move your legs often if you must stand in one place for a long time.  Avoid heavy lifting.  Wear low-heeled shoes, and practice good posture.  You may continue to have sex unless your health care provider directs you otherwise. Relief of Pain or Discomfort  Wear a good support bra for breast tenderness.   Take warm sitz baths to soothe any pain or discomfort caused by hemorrhoids. Use hemorrhoid cream if your health care provider approves.   Rest with your legs elevated if you have leg cramps or low back pain.  If you develop varicose veins in your legs, wear support hose. Elevate your feet for 15 minutes, 3-4 times a day. Limit salt in your diet. Prenatal Care  Schedule your prenatal visits by the twelfth week of pregnancy. They are usually scheduled monthly at first, then more often in the last 2 months before delivery.  Write down your questions. Take them to your prenatal visits.  Keep all your prenatal visits as directed by your   health care provider. Safety  Wear your seat belt at all times when driving.  Make a list of emergency phone numbers, including numbers for family, friends, the hospital, and police and fire departments. General Tips  Ask your health care provider for a referral to a local prenatal education class. Begin classes no later than at the beginning of month 6 of your pregnancy.  Ask for help if you have counseling or nutritional needs during pregnancy. Your health care provider can offer advice or refer you to specialists for help with various needs.  Do not use hot tubs, steam rooms, or saunas.  Do not douche or use tampons or scented sanitary pads.  Do not cross your legs for long periods of time.  Avoid cat litter boxes and soil used by cats. These carry germs that can cause birth defects in the baby and possibly loss of the fetus by miscarriage or stillbirth.  Avoid all smoking, herbs, alcohol, and medicines not prescribed by  your health care provider. Chemicals in these affect the formation and growth of the baby.  Do not use any tobacco products, including cigarettes, chewing tobacco, and electronic cigarettes. If you need help quitting, ask your health care provider. You may receive counseling support and other resources to help you quit.  Schedule a dentist appointment. At home, brush your teeth with a soft toothbrush and be gentle when you floss. SEEK MEDICAL CARE IF:   You have dizziness.  You have mild pelvic cramps, pelvic pressure, or nagging pain in the abdominal area.  You have persistent nausea, vomiting, or diarrhea.  You have a bad smelling vaginal discharge.  You have pain with urination.  You notice increased swelling in your face, hands, legs, or ankles. SEEK IMMEDIATE MEDICAL CARE IF:   You have a fever.  You are leaking fluid from your vagina.  You have spotting or bleeding from your vagina.  You have severe abdominal cramping or pain.  You have rapid weight gain or loss.  You vomit blood or material that looks like coffee grounds.  You are exposed to [place] measles and have never had them.  You are exposed to fifth disease or chickenpox.  You develop a severe headache.  You have shortness of breath.  You have any kind of trauma, such as from a fall or a car accident.   This information is not intended to replace advice given to you by your health care provider. Make sure you discuss any questions you have with your health care provider.   Document Released: 05/05/2001 Document Revised: 06/01/2014 Document Reviewed: 03/21/2013 Elsevier Interactive Patient Education 2016 [employer] Follow up in 3 weeks for IT/NT and 1 week to talk with J[facility]

## 2015-10-30 LAB — URINE CULTURE: Organism ID, Bacteria: NO GROWTH

## 2015-10-30 LAB — GC/CHLAMYDIA PROBE AMP
CHLAMYDIA, DNA PROBE: NEGATIVE
Neisseria gonorrhoeae by PCR: NEGATIVE

## 2015-10-31 LAB — PMP SCREEN PROFILE (10S), URINE
Amphetamine Screen, Ur: NEGATIVE ng/mL
BENZODIAZEPINE SCREEN, URINE: NEGATIVE ng/mL
Barbiturate Screen, Ur: NEGATIVE ng/mL
CREATININE(CRT), U: 232.8 mg/dL (ref 20.0–300.0)
Cannabinoids Ur Ql Scn: NEGATIVE ng/mL
Cocaine(Metab.)Screen, Urine: NEGATIVE ng/mL
Methadone Scn, Ur: NEGATIVE ng/mL
Opiate Scrn, Ur: NEGATIVE ng/mL
Oxycodone+Oxymorphone Ur Ql Scn: NEGATIVE ng/mL
PCP Scrn, Ur: NEGATIVE ng/mL
PH UR, DRUG SCRN: 5.6 (ref 4.5–8.9)
Propoxyphene, Screen: NEGATIVE ng/mL

## 2015-10-31 LAB — ABO/RH: RH TYPE: POSITIVE

## 2015-10-31 LAB — RPR: RPR: NONREACTIVE

## 2015-10-31 LAB — ANTIBODY SCREEN: Antibody Screen: NEGATIVE

## 2015-10-31 LAB — URINALYSIS, ROUTINE W REFLEX MICROSCOPIC
Bilirubin, UA: NEGATIVE
Glucose, UA: NEGATIVE
KETONES UA: NEGATIVE
LEUKOCYTES UA: NEGATIVE
NITRITE UA: NEGATIVE
Protein, UA: NEGATIVE
RBC, UA: NEGATIVE
SPEC GRAV UA: 1.029 (ref 1.005–1.030)
Urobilinogen, Ur: 0.2 mg/dL (ref 0.2–1.0)
pH, UA: 6 (ref 5.0–7.5)

## 2015-10-31 LAB — CBC
Hematocrit: 41 % (ref 34.0–46.6)
Hemoglobin: 13.9 g/dL (ref 11.1–15.9)
MCH: 30.5 pg (ref 26.6–33.0)
MCHC: 33.9 g/dL (ref 31.5–35.7)
MCV: 90 fL (ref 79–97)
PLATELETS: 322 10*3/uL (ref 150–379)
RBC: 4.55 x10E6/uL (ref 3.77–5.28)
RDW: 13.2 % (ref 12.3–15.4)
WBC: 13.3 10*3/uL — ABNORMAL HIGH (ref 3.4–10.8)

## 2015-10-31 LAB — HEPATITIS B SURFACE ANTIGEN: Hepatitis B Surface Ag: NEGATIVE

## 2015-10-31 LAB — RUBELLA SCREEN: RUBELLA: 5.42 {index} (ref >0.99)

## 2015-10-31 LAB — TSH: TSH: 1.68 u[IU]/mL (ref 0.450–4.500)

## 2015-10-31 LAB — HIV ANTIBODY (ROUTINE TESTING W REFLEX): HIV Screen 4th Generation wRfx: NONREACTIVE

## 2015-10-31 LAB — VARICELLA ZOSTER ANTIBODY, IGG: Varicella zoster IgG: <135 index — ABNORMAL LOW (ref >165)

## 2015-11-05 ENCOUNTER — Encounter: Payer: Self-pay | Admitting: Obstetrics and Gynecology

## 2015-11-05 ENCOUNTER — Ambulatory Visit (INDEPENDENT_AMBULATORY_CARE_PROVIDER_SITE_OTHER): Payer: BC Managed Care – PPO | Admitting: Obstetrics and Gynecology

## 2015-11-05 VITALS — BP 120/80 | HR 92 | Wt 209.0 lb

## 2015-11-05 DIAGNOSIS — Z3A11 11 weeks gestation of pregnancy: Secondary | ICD-10-CM

## 2015-11-05 DIAGNOSIS — Z3481 Encounter for supervision of other normal pregnancy, first trimester: Secondary | ICD-10-CM

## 2015-11-05 DIAGNOSIS — Z331 Pregnant state, incidental: Secondary | ICD-10-CM

## 2015-11-05 DIAGNOSIS — Z1389 Encounter for screening for other disorder: Secondary | ICD-10-CM

## 2015-11-05 LAB — POCT URINALYSIS DIPSTICK
GLUCOSE UA: NEGATIVE
Ketones, UA: NEGATIVE
LEUKOCYTES UA: NEGATIVE
NITRITE UA: NEGATIVE
Protein, UA: NEGATIVE
RBC UA: NEGATIVE

## 2015-11-05 NOTE — Progress Notes (Signed)
Pt denies any problems or concerns at this time. Discuss progesterone

## 2015-11-14 ENCOUNTER — Other Ambulatory Visit: Payer: Self-pay | Admitting: Adult Health

## 2015-11-14 ENCOUNTER — Ambulatory Visit (INDEPENDENT_AMBULATORY_CARE_PROVIDER_SITE_OTHER): Payer: BC Managed Care – PPO | Admitting: Obstetrics and Gynecology

## 2015-11-14 ENCOUNTER — Ambulatory Visit (INDEPENDENT_AMBULATORY_CARE_PROVIDER_SITE_OTHER): Payer: BC Managed Care – PPO

## 2015-11-14 ENCOUNTER — Encounter: Payer: Self-pay | Admitting: Obstetrics and Gynecology

## 2015-11-14 VITALS — BP 120/84 | HR 94 | Wt 209.0 lb

## 2015-11-14 DIAGNOSIS — Z3A12 12 weeks gestation of pregnancy: Secondary | ICD-10-CM | POA: Diagnosis not present

## 2015-11-14 DIAGNOSIS — Z1389 Encounter for screening for other disorder: Secondary | ICD-10-CM

## 2015-11-14 DIAGNOSIS — Z36 Encounter for antenatal screening of mother: Secondary | ICD-10-CM | POA: Diagnosis not present

## 2015-11-14 DIAGNOSIS — Z369 Encounter for antenatal screening, unspecified: Secondary | ICD-10-CM

## 2015-11-14 DIAGNOSIS — O09292 Supervision of pregnancy with other poor reproductive or obstetric history, second trimester: Secondary | ICD-10-CM

## 2015-11-14 DIAGNOSIS — Z3682 Encounter for antenatal screening for nuchal translucency: Secondary | ICD-10-CM

## 2015-11-14 DIAGNOSIS — O3431 Maternal care for cervical incompetence, first trimester: Secondary | ICD-10-CM

## 2015-11-14 DIAGNOSIS — O0992 Supervision of high risk pregnancy, unspecified, second trimester: Secondary | ICD-10-CM

## 2015-11-14 DIAGNOSIS — Z331 Pregnant state, incidental: Secondary | ICD-10-CM

## 2015-11-14 LAB — POCT URINALYSIS DIPSTICK
Blood, UA: NEGATIVE
Glucose, UA: NEGATIVE
Ketones, UA: NEGATIVE
LEUKOCYTES UA: NEGATIVE
Nitrite, UA: NEGATIVE

## 2015-11-14 NOTE — Progress Notes (Signed)
Pt denies any problems or concerns at this time.  

## 2015-11-14 NOTE — Progress Notes (Signed)
Patient ID: Kimberly Morgan, female   DOB: 01/11/1981, 35 y.o.   MRN: [id number]   High Risk Pregnancy HROB Diagnosis(es): Preterm delivery at 22+6 weeks, cerclage with last pregnancy resulting in term delivery   G3P1101 653w5d Estimated Date of Delivery: 05/30/16    HPI: The patient is being seen today for ongoing management of the above. Today she reports no complaints  Patient reports good fetal movement, denies any bleeding and no rupture of membranes symptoms or regular contractions.   BP weight and urine results reviewed and noted. Blood pressure 120/84, pulse 94, weight 209 lb (94.802 kg), last menstrual period 05/09/2014, currently breastfeeding.  Fetal Heart rate:  153 bpm  Edema:  none  Urinalysis: POSITIVE for trace protein        NEGATIVE for glucose   Fetal Surveillance Testing today:  NT/IT  Lab and sonogram results have been reviewed. Comments: normal; IT not resulted   Assessment:  1.  Pregnancy at 4453w5d,  G3P1101                            2.  Preterm delivery at 22+6 weeks, cerclage with last pregnancy resulting in term delivery                         3. Will need cerclage after 14 weeks   Medication(s) Plans:  none  Treatment Plan:  Cerclage placement on 11/25/15 at 7:30AM; check TSH Q trimester    Follow up in on 11/25/15 for appointment for high risk OB care, cerclage placement     By signing my name below, I, Doreatha MartinEva Mathews, attest that this documentation has been prepared under the direction and in the presence of Tilda BurrowJohn V Herrick Hartog, MD. Electronically Signed: Doreatha MartinEva Mathews, ED Scribe. 11/14/2015. 10:01 AM.  I personally performed the services described in this documentation, which was SCRIBED in my presence. The recorded information has been reviewed and considered accurate. It has been edited as necessary during review. Tilda BurrowFERGUSON,Tehillah Cipriani V, MD

## 2015-11-14 NOTE — Progress Notes (Signed)
US 11+5 wks,measurement c/w dates,NB present,NT 1 mm,crl 56.2 mm,post pl gr 0,fhr 153 bpm

## 2015-11-18 ENCOUNTER — Other Ambulatory Visit (HOSPITAL_COMMUNITY): Payer: BC Managed Care – PPO

## 2015-11-18 LAB — MATERNAL SCREEN, INTEGRATED #1
CROWN RUMP LENGTH MAT SCREEN: 56.2 mm
GEST. AGE ON COLLECTION DATE: 12.1 wk
MATERNAL AGE AT EDD: 34.1 a
NUMBER OF FETUSES: 1
Nuchal Translucency (NT): 1 mm
PAPP-A Value: 158.3 ng/mL
WEIGHT: 209 [lb_av]

## 2015-11-19 NOTE — Patient Instructions (Signed)
Wendall PapaStarlett Michalsky  11/19/2015     @PREFPERIOPPHARMACY @   Your procedure is scheduled on  11/25/2015   [facility]  [facility].  Call this number if you have problems the morning of surgery:  [phone]   Remember:  Do not eat food or drink liquids after midnight.  Take these medicines the morning of surgery with A SIP OF WATER  levothyroxine  Do not wear jewelry, make-up or nail polish.  Do not wear lotions, powders, or perfumes.  You may wear deoderant.  Do not shave 48 hours prior to surgery.  Men may shave face and neck.  Do not bring valuables to the hospital.  [facility] any belongings or valuables.  Contacts, dentures or bridgework may not be worn into surgery.  Leave your suitcase in the car.  After surgery it may be brought to your room.  For patients admitted to the hospital, discharge time will be determined by your treatment team.  Patients discharged the day of surgery will not be allowed to drive home.   Name and phone number of your driver:   family Special instructions:  none  Please read over the following fact sheets that you were given. Coughing and Deep Breathing, Surgical Site Infection Prevention, Anesthesia Post-op Instructions and Care and Recovery After Surgery      Cervical Cerclage Cervical cerclage is a surgical procedure for an incompetent cervix. An incompetent cervix is a weak cervix that opens up before labor begins. Cervical cerclage is a procedure in which the cervix is sewn closed during pregnancy.  LET [facility] PROVIDER KNOW ABOUT:   Any allergies you have.  All medicines you are taking, including vitamins, herbs, eye drops, creams, and over-the-counter medicines.  Previous problems you or members of your family have had with the use of anesthetics.  Any blood disorders you have.  Previous surgeries you have had.  Medical conditions you have.  Any recent colds or  infections. RISKS AND COMPLICATIONS  Generally, this is a safe procedure. However, as with any procedure, problems can occur. Possible problems include:  Infection.  Bleeding.  Rupturing the amniotic sac (membranes).  Going into early labor and delivery.  Problems with the anesthetics.  Infection of the amniotic sac. BEFORE THE PROCEDURE   Ask your health care provider about changing or stopping your medicines.  Do not eat or drink anything for 6-8 hours before the procedure.  Arrange for someone to drive you home after the procedure. PROCEDURE   An IV tube will be placed in your vein. You will be given a sedative to help you relax.  You will be given a medicine that makes you sleep through the procedure (general anesthetic) or a medicine injected into your spine that numbs your body below the waist (spinal or epidural anesthetic). You will be asleep or be numbed through the entire procedure.  A speculum will be placed in your vagina to visualize your cervix.  The cervix is then grasped and stitched closed tightly.  Ultrasound may be used to guide the procedure and monitor the baby. AFTER THE PROCEDURE   You will go to a recovery room where you and your unborn baby are monitored. Once you are awake, stable, and taking fluids well, you will be allowed to return to your room.  You will usually stay in the hospital overnight.  You may get an injection  of progesterone to prevent uterine contractions.  You may be given pain-relieving medicines to take with you when you go home.  Have someone drive you home and stay with you for up to 2 days.   This information is not intended to replace advice given to you by your health care provider. Make sure you discuss any questions you have with your health care provider.   Document Released: 04/23/2008 Document Revised: 05/16/2013 Document Reviewed: 11/30/2012 Elsevier Interactive Patient Education 2016 Elsevier Inc. Cervical  Cerclage, Care After Refer to this sheet in the next few weeks. These instructions provide you with information on caring for yourself after your procedure. Your health care provider may also give you more specific instructions. Your treatment has been planned according to current medical practices, but problems sometimes occur. Call your health care provider if you have any problems or questions after your procedure. WHAT TO EXPECT AFTER THE PROCEDURE  After your procedure, it is typical to have the following:  Abdominal cramping.  Vaginal spotting. HOME CARE INSTRUCTIONS   Only take over-the-counter or prescription medicines for pain, discomfort, or fever as directed by your health care provider.  Avoid physical activities and exercise until your health care provider says it is okay.  Do not douche or have sexual intercourse until your health care provider tells you it is okay.  Keep your follow-up surgical and prenatal appointments with your health care provider. SEEK MEDICAL CARE IF:   You have abnormal vaginal discharge.  You have a rash.  You become lightheaded or feel faint.  You have abdominal pain that is not controlled with pain medicine. SEEK IMMEDIATE MEDICAL CARE IF:   You develop vaginal bleeding.  You are leaking fluid or have a gush of fluid from the vagina.  You have a fever.  You faint.  You have uterine contractions.  You feel your baby is not moving as much as usual, or you cannot feel your baby move.  You have chest pain or shortness of breath.   This information is not intended to replace advice given to you by your health care provider. Make sure you discuss any questions you have with your health care provider.   Document Released: 03/01/2013 Document Revised: 05/16/2013 Document Reviewed: 03/01/2013 Elsevier Interactive Patient Education 2016 [employer]vier Inc. PATIENT INSTRUCTIONS POST-ANESTHESIA  IMMEDIATELY FOLLOWING SURGERY:  Do not drive or  operate machinery for the first twenty four hours after surgery.  Do not make any important decisions for twenty four hours after surgery or while taking narcotic pain medications or sedatives.  If you develop intractable nausea and vomiting or a severe headache please notify your doctor immediately.  FOLLOW-UP:  Please make an appointment with your surgeon as instructed. You do not need to follow up with anesthesia unless specifically instructed to do so.  WOUND CARE INSTRUCTIONS (if applicable):  Keep a dry clean dressing on the anesthesia/puncture wound site if there is drainage.  Once the wound has quit draining you may leave it open to air.  Generally you should leave the bandage intact for twenty four hours unless there is drainage.  If the epidural site drains for more than 36-48 hours please call the anesthesia department.  QUESTIONS?:  Please feel free to call your physician or the hospital operator if you have any questions, and they will be happy to assist you.

## 2015-11-21 ENCOUNTER — Encounter (HOSPITAL_COMMUNITY): Payer: Self-pay

## 2015-11-21 ENCOUNTER — Other Ambulatory Visit: Payer: Self-pay | Admitting: Obstetrics and Gynecology

## 2015-11-21 ENCOUNTER — Encounter (HOSPITAL_COMMUNITY)
Admission: RE | Admit: 2015-11-21 | Discharge: 2015-11-21 | Disposition: A | Discharge status: Home / Self Care | Payer: BC Managed Care – PPO | Source: Ambulatory Visit | Attending: Obstetrics and Gynecology | Admitting: Obstetrics and Gynecology

## 2015-11-21 DIAGNOSIS — Z01812 Encounter for preprocedural laboratory examination: Secondary | ICD-10-CM | POA: Diagnosis not present

## 2015-11-21 LAB — CBC
HCT: 38.2 % (ref 36.0–46.0)
Hemoglobin: 12.7 g/dL (ref 12.0–15.0)
MCH: 30.3 pg (ref 26.0–34.0)
MCHC: 33.2 g/dL (ref 30.0–36.0)
MCV: 91.2 fL (ref 78.0–100.0)
PLATELETS: 237 10*3/uL (ref 150–400)
RBC: 4.19 MIL/uL (ref 3.87–5.11)
RDW: 13.2 % (ref 11.5–15.5)
WBC: 11.9 10*3/uL — AB (ref 4.0–10.5)

## 2015-11-21 LAB — URINALYSIS, ROUTINE W REFLEX MICROSCOPIC
Bilirubin Urine: NEGATIVE
GLUCOSE, UA: NEGATIVE mg/dL
HGB URINE DIPSTICK: NEGATIVE
KETONES UR: NEGATIVE mg/dL
Leukocytes, UA: NEGATIVE
Nitrite: NEGATIVE
Protein, ur: NEGATIVE mg/dL
Specific Gravity, Urine: 1.02 (ref 1.005–1.030)
pH: 7.5 (ref 5.0–8.0)

## 2015-11-21 LAB — BASIC METABOLIC PANEL
Anion gap: 5 (ref 5–15)
BUN: 7 mg/dL (ref 6–20)
CO2: 25 mmol/L (ref 22–32)
CREATININE: 0.5 mg/dL (ref 0.44–1.00)
Calcium: 8.6 mg/dL — ABNORMAL LOW (ref 8.9–10.3)
Chloride: 106 mmol/L (ref 101–111)
GFR calc Af Amer: >60 mL/min (ref >60)
GLUCOSE: 83 mg/dL (ref 65–99)
Potassium: 4 mmol/L (ref 3.5–5.1)
SODIUM: 136 mmol/L (ref 135–145)

## 2015-11-21 NOTE — H&P (Signed)
Kimberly Morgan is a 35 y.o. female presenting for placement of Mcdonald Cerclage. She is a G3P1101 now at 13+ wks, with a history of cervical incompetence and successful subsequent pregnancy using McDonald Cerclage. Pt has been counselled over recommendation of repeat cerclage, but is aware of treatment options of no cerclage. 17 P to be planned.[name]. 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 Hypothyroidism   . Seasonal allergies   . UTI (lower urinary tract infection)   . Kidney stones   . Preterm labor   . 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 Hypothyroid 10/31/2012  . PONV (postoperative nausea and vomiting)   . Supervision of other high-risk pregnancy 10/29/2015     [facility] at     9+3 weeks FOB  Kimberly Morgan Dating By  US Pap  05/28/15 negative GC/CT Initial:                36+wks: Genetic Screen NT/IT:  CF screen  Anatomic US  Flu vaccine  Tdap Recommended ~ 28wks Glucose Screen  2 hr GBS  Feed Preference  Contraception  Circumcision  Childbirth Classes  Pediatrician    . History of cerclage, currently pregnant 10/29/2015   Past Surgical History  Procedure Laterality Date  . Cholecystectomy      laparoscopic- 2011  . Cervical cerclage  09/30/2011    Procedure: CERCLAGE CERVICAL;  Surgeon: Tilda BurrowJohn V Onya Eutsler, MD;  Location: [facility];  Service: Gynecology;  Laterality: N/A;  . Cervical cerclage N/A 12/01/2012    [facility];  Surgeon: Tilda BurrowJohn V Larose Batres, MD;  Location: [facility];  Service: Gynecology;  Laterality: N/A;   Family History: family history includes COPD in her mother; Cancer in her maternal grandfather; Diabetes in her paternal grandfather; Hypertension in her sister. Social History:  reports  that she quit smoking about 4 years ago. Her smoking use included Cigarettes. She has a 2.5 pack-year smoking history. She has never used smokeless tobacco. She reports that she does not drink alcohol or use illicit drugs.   Prenatal Transfer Tool  Maternal Diabetes:  Genetic Screening:  Maternal Ultrasounds/Referrals:  Fetal Ultrasounds or other Referrals:   Maternal Substance Abuse:   Significant Maternal Medications:   Significant Maternal Lab Results:   Other Comments:    ROS uneventful pregnancy    Last menstrual period 05/09/2014, currently breastfeeding. Exam Physical Exam  Prenatal labs: ABO, Rh: O/Positive/-- (06/06 1534) Antibody: Negative (06/06 1534) Rubella: 5.42 (06/06 1534) RPR: Non Reactive (06/06 1534)  HBsAg: Negative (06/06 1534)  HIV: Non Reactive (06/06 1534)  GBS:   n/a  Assessment/Plan: Pregnancy 13+ wk History of cervical incompetence IVF pregnancy ICSI due to female factor.  Plan McDonald Cerclage on 7/3   Rolfe Hartsell V 11/21/2015, 9:22 AM

## 2015-11-22 LAB — TYPE AND SCREEN
ABO/RH(D): O POS
ANTIBODY SCREEN: NEGATIVE

## 2015-11-25 ENCOUNTER — Ambulatory Visit (HOSPITAL_COMMUNITY): Payer: BC Managed Care – PPO | Admitting: Anesthesiology

## 2015-11-25 ENCOUNTER — Encounter (HOSPITAL_COMMUNITY)
Admission: RE | Disposition: A | Discharge status: Home / Self Care | Payer: Self-pay | Source: Ambulatory Visit | Attending: Obstetrics and Gynecology

## 2015-11-25 ENCOUNTER — Ambulatory Visit (HOSPITAL_COMMUNITY)
Admission: RE | Admit: 2015-11-25 | Discharge: 2015-11-25 | Disposition: A | Discharge status: Home / Self Care | Payer: BC Managed Care – PPO | Source: Ambulatory Visit | Attending: Obstetrics and Gynecology | Admitting: Obstetrics and Gynecology

## 2015-11-25 ENCOUNTER — Encounter (HOSPITAL_COMMUNITY): Payer: Self-pay

## 2015-11-25 DIAGNOSIS — O99281 Endocrine, nutritional and metabolic diseases complicating pregnancy, first trimester: Secondary | ICD-10-CM | POA: Insufficient documentation

## 2015-11-25 DIAGNOSIS — Z87891 Personal history of nicotine dependence: Secondary | ICD-10-CM | POA: Insufficient documentation

## 2015-11-25 DIAGNOSIS — O3431 Maternal care for cervical incompetence, first trimester: Secondary | ICD-10-CM | POA: Diagnosis present

## 2015-11-25 DIAGNOSIS — Z3A13 13 weeks gestation of pregnancy: Secondary | ICD-10-CM | POA: Insufficient documentation

## 2015-11-25 DIAGNOSIS — Z8742 Personal history of other diseases of the female genital tract: Secondary | ICD-10-CM

## 2015-11-25 DIAGNOSIS — Z8759 Personal history of other complications of pregnancy, childbirth and the puerperium: Secondary | ICD-10-CM

## 2015-11-25 HISTORY — PX: CERVICAL CERCLAGE: SHX1329

## 2015-11-25 SURGERY — CERCLAGE, CERVIX, VAGINAL APPROACH
Anesthesia: Spinal | Site: Cervix

## 2015-11-25 MED ORDER — FENTANYL CITRATE (PF) 100 MCG/2ML IJ SOLN
INTRAMUSCULAR | Status: DC | PRN
  Administered 2015-11-25: 12.5 ug via INTRATHECAL
  Administered 2015-11-25: 12.5 ug via INTRAVENOUS
  Administered 2015-11-25: 50 ug via INTRAVENOUS
  Administered 2015-11-25: 25 ug via INTRAVENOUS

## 2015-11-25 MED ORDER — ONDANSETRON HCL 4 MG/2ML IJ SOLN
4.0000 mg | Freq: Once | INTRAMUSCULAR | Status: AC
  Administered 2015-11-25: 4 mg via INTRAVENOUS

## 2015-11-25 MED ORDER — FENTANYL CITRATE (PF) 100 MCG/2ML IJ SOLN
INTRAMUSCULAR | Status: AC
  Filled 2015-11-25: qty 2

## 2015-11-25 MED ORDER — ONDANSETRON HCL 4 MG/2ML IJ SOLN: 4.0000 mg | Freq: Once | INTRAMUSCULAR | Status: DC | PRN

## 2015-11-25 MED ORDER — PROPOFOL 10 MG/ML IV BOLUS
INTRAVENOUS | Status: AC
  Filled 2015-11-25: qty 20

## 2015-11-25 MED ORDER — MIDAZOLAM HCL 2 MG/2ML IJ SOLN
1.0000 mg | INTRAMUSCULAR | Status: DC | PRN
  Administered 2015-11-25: 2 mg via INTRAVENOUS

## 2015-11-25 MED ORDER — MIDAZOLAM HCL 5 MG/5ML IJ SOLN
INTRAMUSCULAR | Status: DC | PRN
  Administered 2015-11-25 (×2): 1 mg via INTRAVENOUS

## 2015-11-25 MED ORDER — MIDAZOLAM HCL 2 MG/2ML IJ SOLN
INTRAMUSCULAR | Status: AC
  Filled 2015-11-25: qty 2

## 2015-11-25 MED ORDER — CEFAZOLIN SODIUM-DEXTROSE 2-4 GM/100ML-% IV SOLN
2.0000 g | INTRAVENOUS | Status: AC
  Administered 2015-11-25: 2 g via INTRAVENOUS
  Filled 2015-11-25: qty 100

## 2015-11-25 MED ORDER — ONDANSETRON HCL 4 MG/2ML IJ SOLN
INTRAMUSCULAR | Status: AC
  Filled 2015-11-25: qty 2

## 2015-11-25 MED ORDER — LACTATED RINGERS IV SOLN
INTRAVENOUS | Status: DC | PRN
  Administered 2015-11-25: 07:00:00 via INTRAVENOUS

## 2015-11-25 MED ORDER — 0.9 % SODIUM CHLORIDE (POUR BTL) OPTIME
TOPICAL | Status: DC | PRN
  Administered 2015-11-25: 1000 mL

## 2015-11-25 MED ORDER — FENTANYL CITRATE (PF) 100 MCG/2ML IJ SOLN: 25.0000 ug | INTRAMUSCULAR | Status: DC | PRN

## 2015-11-25 MED ORDER — HYDROCODONE-ACETAMINOPHEN 5-325 MG PO TABS: 1.0000 | ORAL_TABLET | Freq: Four times a day (QID) | ORAL | Status: DC | PRN

## 2015-11-25 MED ORDER — BUPIVACAINE IN DEXTROSE 0.75-8.25 % IT SOLN
INTRATHECAL | Status: AC
  Filled 2015-11-25: qty 2

## 2015-11-25 MED ORDER — LACTATED RINGERS IV SOLN
INTRAVENOUS | Status: DC
  Administered 2015-11-25: 1000 mL via INTRAVENOUS

## 2015-11-25 MED ORDER — FENTANYL CITRATE (PF) 100 MCG/2ML IJ SOLN
25.0000 ug | INTRAMUSCULAR | Status: AC
  Administered 2015-11-25 (×2): 25 ug via INTRAVENOUS

## 2015-11-25 MED ORDER — BUPIVACAINE IN DEXTROSE 0.75-8.25 % IT SOLN
INTRATHECAL | Status: DC | PRN
  Administered 2015-11-25: 7.5 mg via INTRATHECAL

## 2015-11-25 SURGICAL SUPPLY — 20 items
BAG HAMPER (MISCELLANEOUS) ×3 IMPLANT
CLOTH BEACON ORANGE TIMEOUT ST (SAFETY) ×3 IMPLANT
COVER LIGHT HANDLE STERIS (MISCELLANEOUS) ×6 IMPLANT
DRAPE PROXIMA HALF (DRAPES) ×3 IMPLANT
GLOVE BIOGEL PI IND STRL 7.0 (GLOVE) ×1 IMPLANT
GLOVE BIOGEL PI IND STRL 8 (GLOVE) IMPLANT
GLOVE BIOGEL PI IND STRL 9 (GLOVE) ×1 IMPLANT
GLOVE BIOGEL PI INDICATOR 7.0 (GLOVE) ×2
GLOVE BIOGEL PI INDICATOR 8 (GLOVE) ×2
GLOVE BIOGEL PI INDICATOR 9 (GLOVE) ×4
GLOVE SURG SS PI 7.0 STRL IVOR (GLOVE) ×2 IMPLANT
GOWN SPEC L3 XXLG W/TWL (GOWN DISPOSABLE) ×3 IMPLANT
GOWN STRL REUS W/TWL LRG LVL3 (GOWN DISPOSABLE) ×3 IMPLANT
KIT ROOM TURNOVER AP CYSTO (KITS) ×3 IMPLANT
MANIFOLD NEPTUNE II (INSTRUMENTS) ×3 IMPLANT
NS IRRIG 1000ML POUR BTL (IV SOLUTION) ×3 IMPLANT
PACK PERI GYN (CUSTOM PROCEDURE TRAY) ×3 IMPLANT
PAD ARMBOARD 7.5X6 YLW CONV (MISCELLANEOUS) ×3 IMPLANT
SET BASIN LINEN APH (SET/KITS/TRAYS/PACK) ×2 IMPLANT
SUT PROLENE 0 CT 1 30 (SUTURE) ×4 IMPLANT

## 2015-11-25 NOTE — H&P (Signed)
Kimberly Morgan is a 35 y.o. female G3P1101 at 7467w2d  presenting for McDonald Cerclage.She had successful pregnancy with cerclage last pregnancy, after prior pregnancy loss at 7486w6d, so cercalage discussed, risks, including inadvertent membrane rupture at cerclage, injury to organs such as ureters, or bleeding, benefits including possible prolongation of pregnancy. Pt aware that cerclage must be removed to allow vaginal delivery.  This pregnancy is a product of ICSI, from prior ovum harvest.  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 Hypothyroidism   . Seasonal allergies   . UTI (lower urinary tract infection)   . Kidney stones   . Preterm labor   . 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 Hypothyroid 10/31/2012  . PONV (postoperative nausea and vomiting)   . Supervision of other high-risk pregnancy 10/29/2015     [facility] at     9+3 weeks FOB  Rudell CobbJosh Angert Dating By  US Pap  05/28/15 negative GC/CT Initial:                36+wks: Genetic Screen NT/IT:  CF screen  Anatomic US  Flu vaccine  Tdap Recommended ~ 28wks Glucose Screen  2 hr GBS  Feed Preference  Contraception  Circumcision  Childbirth Classes  Pediatrician    . History of cerclage, currently pregnant 10/29/2015   Past Surgical History  Procedure Laterality Date  . Cholecystectomy      laparoscopic- 2011  . Cervical cerclage  09/30/2011    Procedure: CERCLAGE CERVICAL;  Surgeon: Tilda BurrowJohn V Lizza Huffaker, MD;  Location: [facility];  Service: Gynecology;  Laterality: N/A;  . Cervical cerclage N/A 12/01/2012    [facility];  Surgeon: Tilda BurrowJohn V Clancy Leiner, MD;  Location: [facility];  Service: Gynecology;  Laterality: N/A;   Family History: family history includes  COPD in her mother; Cancer in her maternal grandfather; Diabetes in her paternal grandfather; Hypertension in her sister. Social History:  reports that she quit smoking about 4 years ago. Her smoking use included Cigarettes. She has a 2.5 pack-year smoking history. She has never used smokeless tobacco. She reports that she does not drink alcohol or use illicit drugs.   Prenatal Transfer Tool  Maternal Diabetes:  Genetic Screening:  Maternal Ultrasounds/Referrals: Normal Fetal Ultrasounds or other Referrals:  None Maternal Substance Abuse:  No Significant Maternal Medications:  None Significant Maternal Lab Results:  None Other Comments:    ROS    Blood pressure 128/81, pulse 92, temperature 98.6 F (37 C), temperature source Oral, resp. rate 16, height 5\' 3"  (1.6 m), weight 212 lb (96.163 kg), last menstrual period 05/09/2014, SpO2 98 %, currently breastfeeding.  Physical Examination: General appearance - alert, well appearing, and in no distress, oriented to person, place, and time, overweight, well hydrated and anxious Mental status - alert, oriented to person, place, and time, normal mood, behavior, speech, dress, motor activity, and thought processes Chest - clear to auscultation, no wheezes, rales or rhonchi, symmetric air entry Abdomen - soft, nontender, nondistended, no masses or organomegaly Pelvic - VULVA: normal appearing vulva with no masses, tenderness or lesions, VAGINA: normal appearing vagina with normal color and discharge,  no lesions, CERVIX: normal appearing cervix without discharge or lesions, good cervical length on recent u/s , UTERUS: uterus is normal size, shape, consistency and nontender, enlarged to 12-14 week's size, ADNEXA: normal adnexa in size, nontender and no masses   Maternal Exam:  Abdomen: Patient reports no abdominal tenderness. Introitus: Normal vulva. Normal vagina.    Fetal Exam Fetal Monitor Review: Mode: hand-held doppler probe.   Baseline rate:  145.      Physical Exam  Prenatal labs: ABO, Rh: --/--/O POS (06/29 0930) Antibody: NEG (06/29 0930) Rubella: 5.42 (06/06 1534) RPR: Non Reactive (06/06 1534)  HBsAg: Negative (06/06 1534)  HIV: Non Reactive (06/06 1534)  GBS:   n/a  Assessment/Plan: Pregnancy 7482w2d  Hx cervical incompetence prior pregnancy, for prophylactic McDonald Cerclage. Rh POS PLan : McDonald single stitch cerclage.   Jalilah Wiltsie V 11/25/2015, 7:16 AM

## 2015-11-25 NOTE — OR Nursing (Signed)
Fetal HR found midline and very low with rate of 147. Patient was stung by bee yesterday and has moderate sized whelp and raised red area on top of right thigh.

## 2015-11-25 NOTE — Transfer of Care (Signed)
Immediate Anesthesia Transfer of Care Note  Patient: Kimberly Morgan  Procedure(s) Performed: Procedure(s): CERCLAGE CERVICAL--McDonald cerclage (N/A)  Patient Location: PACU  Anesthesia Type:Spinal  Level of Consciousness: awake, alert  and oriented  Airway & Oxygen Therapy: Patient Spontanous Breathing and Patient connected to nasal cannula oxygen  Post-op Assessment: Report given to RN and Post -op Vital signs reviewed and stable  Post vital signs: Reviewed and stable  Last Vitals:  Filed Vitals:   11/25/15 0725 11/25/15 0730  BP: 120/77 115/71  Pulse:    Temp:    Resp: 15 12    Last Pain: There were no vitals filed for this visit.    Patients Stated Pain Goal: 8 (11/25/15 [id number])  Complications: No apparent anesthesia complications

## 2015-11-25 NOTE — Progress Notes (Signed)
Fetal heart rate 154 in PACU

## 2015-11-25 NOTE — Anesthesia Postprocedure Evaluation (Signed)
Anesthesia Post Note  Patient: Kimberly Morgan  Procedure(s) Performed: Procedure(s) (LRB): CERCLAGE CERVICAL--McDonald cerclage (N/A)  Patient location during evaluation: PACU Anesthesia Type: MAC Level of consciousness: awake and alert Pain management: pain level controlled Vital Signs Assessment: post-procedure vital signs reviewed and stable Respiratory status: spontaneous breathing Cardiovascular status: stable Postop Assessment: no signs of nausea or vomiting Anesthetic complications: no    Last Vitals:  Filed Vitals:   11/25/15 0725 11/25/15 0730  BP: 120/77 115/71  Pulse:    Temp:    Resp: 15 12    Last Pain: There were no vitals filed for this visit.               ADAMS, AMY A

## 2015-11-25 NOTE — Anesthesia Procedure Notes (Addendum)
Date/Time: 11/25/2015 7:35 AM Performed by: Pernell DupreADAMS, AMY A Pre-anesthesia Checklist: Patient identified, Timeout performed, Emergency Drugs available, Suction available and Patient being monitored Oxygen Delivery Method: Simple face mask    Spinal  Start time: 11/25/2015 7:50 AM Staffing Anesthesiologist: Laurene FootmanGONZALEZ, LUIS Resident/CRNA: ADAMS, AMY A Preanesthetic Checklist Completed: patient identified, site marked, surgical consent, pre-op evaluation, timeout performed, IV checked, risks and benefits discussed and monitors and equipment checked Spinal Block Patient position: sitting Prep: Betadine Patient monitoring: heart rate, cardiac monitor, continuous pulse ox and blood pressure Location: L5-S1 Injection technique: single-shot Needle Needle type: Sprotte  Needle gauge: 24 G Needle length: 9 cm Assessment Sensory level: Sensory level S1. Additional Notes Attempt x1 by me; Block placed by Dr. Marcos EkeGonzales 1 attempt; patient tolerated well;   LOT [id number] Exp 04/2016

## 2015-11-25 NOTE — Brief Op Note (Addendum)
11/25/2015  8:43 AM  PATIENT:  Wendall PapaStarlett Kozar  35 y.o. female  PRE-OPERATIVE DIAGNOSIS:  History of cervical incompetence, currently pregnant, 13 weeks 2 days  POST-OPERATIVE DIAGNOSIS:  History of cervical incompetence, currently pregnant [redacted] weeks 2 days  PROCEDURE:  Procedure(s): CERCLAGE CERVICAL--McDonald cerclage (N/A)  SURGEON:  Surgeon(s) and Role:    * Tilda BurrowJohn V Feliciana Narayan, MD - Primary  PHYSICIAN ASSISTANT:   ASSISTANTS: Trenton FoundsWendy Kendrick, CST   ANESTHESIA:   spinal saddle block  EBL:  Total I/O In: 600 [I.V.:600] Out: 0   BLOOD ADMINISTERED:none  DRAINS: none   LOCAL MEDICATIONS USED:  NONE  SPECIMEN:  No Specimen  DISPOSITION OF SPECIMEN:  N/A  COUNTS:  YES  TOURNIQUET:  * No tourniquets in log *  DICTATION: .Dragon Dictation  PLAN OF CARE: Discharge to home after PACU  PATIENT DISPOSITION:  PACU - hemodynamically stable.   Delay start of Pharmacological VTE agent (>24hrs) due to surgical blood loss or risk of bleeding: not applicable

## 2015-11-25 NOTE — Discharge Instructions (Signed)
You may take the Vicodin if needed for discomfort without worry For mild discomfort  Tylenol was recommended  Cervical Cerclage, Care After Refer to this sheet in the next few weeks. These instructions provide you with information on caring for yourself after your procedure. Your health care provider may also give you more specific instructions. Your treatment has been planned according to current medical practices, but problems sometimes occur. Call your health care provider if you have any problems or questions after your procedure. WHAT TO EXPECT AFTER THE PROCEDURE  After your procedure, it is typical to have the following:  Abdominal cramping.  Vaginal spotting. HOME CARE INSTRUCTIONS   Only take over-the-counter or prescription medicines for pain, discomfort, or fever as directed by your health care provider.  Avoid physical activities and exercise until your health care provider says it is okay.  Do not douche or have sexual intercourse until your health care provider tells you it is okay.  Keep your follow-up surgical and prenatal appointments with your health care provider. SEEK MEDICAL CARE IF:   You have abnormal vaginal discharge.  You have a rash.  You become lightheaded or feel faint.  You have abdominal pain that is not controlled with pain medicine. SEEK IMMEDIATE MEDICAL CARE IF:   You develop vaginal bleeding.  You are leaking fluid or have a gush of fluid from the vagina.  You have a fever.  You faint.  You have uterine contractions.  You feel your baby is not moving as much as usual, or you cannot feel your baby move.  You have chest pain or shortness of breath.   This information is not intended to replace advice given to you by your health care provider. Make sure you discuss any questions you have with your health care provider.   Document Released: 03/01/2013 Document Revised: 05/16/2013 Document Reviewed: 03/01/2013 Elsevier Interactive Patient  Education 2016 [employer]  PATIENT INSTRUCTIONS POST-ANESTHESIA  IMMEDIATELY FOLLOWING SURGERY:  Do not drive or operate machinery for the first twenty four hours after surgery.  Do not make any important decisions for twenty four hours after surgery or while taking narcotic pain medications or sedatives.  If you develop intractable nausea and vomiting or a severe headache please notify your doctor immediately.  FOLLOW-UP:  Please make an appointment with your surgeon as instructed. You do not need to follow up with anesthesia unless specifically instructed to do so.  WOUND CARE INSTRUCTIONS (if applicable):  Keep a dry clean dressing on the anesthesia/puncture wound site if there is drainage.  Once the wound has quit draining you may leave it open to air.  Generally you should leave the bandage intact for twenty four hours unless there is drainage.  If the epidural site drains for more than 36-48 hours please call the anesthesia department.  QUESTIONS?:  Please feel free to call your physician or the hospital operator if you have any questions, and they will be happy to assist you.

## 2015-11-25 NOTE — Op Note (Signed)
11/25/2015  8:43 AM  PATIENT:  Kimberly Morgan  35 y.o. female  PRE-OPERATIVE DIAGNOSIS:  History of cervical incompetence, currently pregnant, 13 weeks 2 days  POST-OPERATIVE DIAGNOSIS:  History of cervical incompetence, currently pregnant [redacted] weeks 2 days  PROCEDURE:  Procedure(s): CERCLAGE CERVICAL--McDonald cerclage (N/A)  SURGEON:  Surgeon(s) and Role:    * Tilda BurrowJohn V Salimatou Simone, MD - Primary  PHYSICIAN ASSISTANT:   ASSISTANTS: Trenton FoundsWendy Kendrick, CST   ANESTHESIA:   spinal saddle block  EBL:  Total I/O In: 600 [I.V.:600] Out: 0   BLOOD ADMINISTERED:none  DRAINS: none   LOCAL MEDICATIONS USED:  NONE  SPECIMEN:  No Specimen  DISPOSITION OF SPECIMEN:  N/A  COUNTS:  YES  TOURNIQUET:  * No tourniquets in log *  DICTATION: .Dragon Dictation  PLAN OF CARE: Discharge to home after PACU  PATIENT DISPOSITION:  PACU - hemodynamically stable.   Delay start of Pharmacological VTE agent (>24hrs) due to surgical blood loss or risk of bleeding: not applicable Details of procedure: Patient was taken operating room prepped and draped for vaginal procedure after saddle block spinal anesthesia performed with a small narrow area of anesthesia around the perineum. The patient had leg control. Timeout was conducted and operative team confirmed the surgical procedure planned. The vagina and perineum were prepped and draped, speculum open sided placed in the vagina and the cervix identified. The previous charring from the seventh of cerclage were noted. Cervical length was about 2 cm into the vagina. Orthoscopic were used to grasp the tissue edges of the cervix and a circumferential 0 Prolene suture was placed beginning at 12:00 and proceeding clockwise around the cervix, with the knot tied down modestly taut, and the knot tied 8 or 9 knots. And left 2 cm in length. Blood type is confirmed as Rh+. Patient went to recovery room in stable condition and will be discharged home with Vicodin for cramps  along with Tylenol for mild-to-moderate discomfort. Sponge and needle counts correct

## 2015-11-25 NOTE — Anesthesia Preprocedure Evaluation (Signed)
Anesthesia Evaluation  Patient identified by MRN, date of birth, ID band Patient awake    Reviewed: Allergy & Precautions, H&P , NPO status , Patient's Chart, lab work & pertinent test results  History of Anesthesia Complications Negative for: history of anesthetic complications  Airway Mallampati: I  TM Distance: >3 FB     Dental  (+) Teeth Intact   Pulmonary neg pulmonary ROS, Current Smoker, former smoker,    breath sounds clear to auscultation       Cardiovascular negative cardio ROS   Rhythm:Regular Rate:Normal     Neuro/Psych    GI/Hepatic neg GERD  ,  Endo/Other  Hypothyroidism   Renal/GU      Musculoskeletal   Abdominal   Peds  Hematology   Anesthesia Other Findings   Reproductive/Obstetrics                             Anesthesia Physical Anesthesia Plan  ASA: II  Anesthesia Plan: Spinal   Post-op Pain Management:    Induction:   Airway Management Planned: Nasal Cannula  Additional Equipment:   Intra-op Plan:   Post-operative Plan:   Informed Consent: I have reviewed the patients History and Physical, chart, labs and discussed the procedure including the risks, benefits and alternatives for the proposed anesthesia with the patient or authorized representative who has indicated his/her understanding and acceptance.     Plan Discussed with:   Anesthesia Plan Comments: (Egg allergy - duck eggs only, egg whites OK.)        Anesthesia Quick Evaluation

## 2015-11-25 NOTE — Progress Notes (Signed)
Patient ambulating with minimal assistance.  "ready to go home".

## 2015-11-27 ENCOUNTER — Other Ambulatory Visit: Payer: Self-pay | Admitting: Obstetrics & Gynecology

## 2015-12-03 ENCOUNTER — Encounter: Payer: BC Managed Care – PPO | Admitting: Obstetrics and Gynecology

## 2015-12-04 ENCOUNTER — Encounter: Payer: Self-pay | Admitting: Obstetrics and Gynecology

## 2015-12-04 ENCOUNTER — Ambulatory Visit (INDEPENDENT_AMBULATORY_CARE_PROVIDER_SITE_OTHER): Payer: BC Managed Care – PPO | Admitting: Obstetrics and Gynecology

## 2015-12-04 ENCOUNTER — Encounter: Payer: BC Managed Care – PPO | Admitting: Obstetrics and Gynecology

## 2015-12-04 VITALS — BP 130/80 | HR 110 | Ht 63.0 in | Wt 209.5 lb

## 2015-12-04 DIAGNOSIS — Z331 Pregnant state, incidental: Secondary | ICD-10-CM

## 2015-12-04 DIAGNOSIS — O09292 Supervision of pregnancy with other poor reproductive or obstetric history, second trimester: Secondary | ICD-10-CM

## 2015-12-04 DIAGNOSIS — Z3A15 15 weeks gestation of pregnancy: Secondary | ICD-10-CM

## 2015-12-04 DIAGNOSIS — O09892 Supervision of other high risk pregnancies, second trimester: Secondary | ICD-10-CM

## 2015-12-04 DIAGNOSIS — Z1389 Encounter for screening for other disorder: Secondary | ICD-10-CM

## 2015-12-04 DIAGNOSIS — O3431 Maternal care for cervical incompetence, first trimester: Secondary | ICD-10-CM

## 2015-12-04 LAB — POCT URINALYSIS DIPSTICK
Blood, UA: NEGATIVE
GLUCOSE UA: NEGATIVE
KETONES UA: NEGATIVE
LEUKOCYTES UA: NEGATIVE
Nitrite, UA: NEGATIVE
PROTEIN UA: NEGATIVE

## 2015-12-04 MED ORDER — PROGESTERONE MICRONIZED 200 MG PO CAPS: 200.0000 mg | ORAL_CAPSULE | Freq: Every day | ORAL | Status: DC

## 2015-12-04 NOTE — Progress Notes (Signed)
Patient ID: Wendall PapaStarlett Misuraca, female   DOB: 1980/08/20, 35 y.o.   MRN: [id number]   High Risk Pregnancy HROB Diagnosis(es):  Preterm delivery at 22+6 weeks, cerclage with last pregnancy resulting in term delivery, s/p cerclage placement on 11/25/15  G3P1101 1836w4d Estimated Date of Delivery: 05/30/16    HPI: The patient is being seen today for ongoing management of the above.  Today she reports she is doing well since cerclage placement. She does complain of vaginal burning and itching for 3 days.   Patient reports good fetal movement, denies any bleeding and no rupture of membranes symptoms or regular contractions.   BP weight and urine results reviewed and noted. Blood pressure 130/80, pulse 110, height 5\' 3"  (1.6 m), weight 209 lb 8 oz (95.029 kg), last menstrual period 05/09/2014, currently breastfeeding.  Fetal Heart rate:  159 bpm  Physical Examination: Abdomen - soft, nontender, nondistended, no masses or organomegaly                                     Pelvic - VULVA: normal appearing vulva with no masses, tenderness or lesions, VAGINA: normal appearing vagina with normal color and discharge, no lesions, CERVIX: normal appearing cervix without discharge or lesions, cervix long and smooth, cerclage string appropriate length, clear cervical mucous                                      Edema:  None   Urinalysis: NEGATIVE  Fetal Surveillance Testing today:  none  Lab and sonogram results have been reviewed. Comments: not done   Assessment:  1.  Pregnancy at 4436w4d,  G3P1101   : Estimated Date of Delivery: 05/30/16                         2.  Preterm delivery at 22+6 weeks, cerclage with last pregnancy resulting in term delivery, s/p cerclage placement on 11/25/15                        3. Cerclage placement appropriate on exam   Discussed with pt risks and benefits of Prometrium vs 17P. At end of discussion, pt had opportunity to ask questions and has no further questions at this time.    Greater than 50% was spent in counseling and coordination of care with the patient. Total time greater than: 15 minutes   Medication(s) Plans:  OTC monistat PRN, Prometrium starting at 16 weeks    Treatment Plan:  OTC monistat or f/u for recheck if irritation worsens, begin Prometrium in 2 weeks, check TSH Q trimester   Follow up in 4 weeks for appointment for high risk OB care    By signing my name below, I, Doreatha MartinEva Mathews, attest that this documentation has been prepared under the direction and in the presence of Tilda BurrowJohn V Antoinne Spadaccini, MD. Electronically Signed: Doreatha MartinEva Mathews, ED Scribe. 12/04/2015. 3:20 PM.  I personally performed the services described in this documentation, which was SCRIBED in my presence. The recorded information has been reviewed and considered accurate. It has been edited as necessary during review. Tilda BurrowFERGUSON,Kamori Barbier V, MD

## 2015-12-04 NOTE — Progress Notes (Signed)
Patient ID: Kimberly Morgan, female   DOB: 04-Feb-1981, 35 y.o.   MRN: [id number] Pt here today for post op visit. Pt states that she has had some vaginal irritation, itching and burning since her surgery. Pt states that she did use some monistat on the outside but not on the inside of the vagina.

## 2015-12-05 ENCOUNTER — Encounter (HOSPITAL_COMMUNITY): Payer: Self-pay | Admitting: Obstetrics and Gynecology

## 2015-12-18 ENCOUNTER — Other Ambulatory Visit: Payer: BC Managed Care – PPO

## 2015-12-18 DIAGNOSIS — Z3682 Encounter for antenatal screening for nuchal translucency: Secondary | ICD-10-CM

## 2015-12-18 DIAGNOSIS — Z77011 Contact with and (suspected) exposure to lead: Secondary | ICD-10-CM

## 2015-12-18 DIAGNOSIS — Z331 Pregnant state, incidental: Secondary | ICD-10-CM

## 2015-12-18 NOTE — Addendum Note (Signed)
Addended by: Cheral Marker on: 12/18/2015 09:20 AM   Modules accepted: Orders

## 2015-12-19 LAB — LEAD, BLOOD (ADULT >= 16 YRS): Lead-Whole Blood: NOT DETECTED ug/dL (ref 0–19)

## 2015-12-20 LAB — MATERNAL SCREEN, INTEGRATED #2
AFP MARKER: 19.2 ng/mL
AFP MOM: 0.7
CROWN RUMP LENGTH: 56.2 mm
DIA MoM: 0.99
DIA Value: 140.8 pg/mL
ESTRIOL UNCONJUGATED: 1.07 ng/mL
GESTATIONAL AGE: 17 wk
Gest. Age on Collection Date: 12.1 weeks
Maternal Age at EDD: 34.1 years
Nuchal Translucency (NT): 1 mm
Nuchal Translucency MoM: 0.68
Number of Fetuses: 1
PAPP-A MoM: 0.29
PAPP-A Value: 158.3 ng/mL
TEST RESULTS: NEGATIVE
WEIGHT: 209 [lb_av]
Weight: 209 [lb_av]
hCG MoM: 1.5
hCG Value: 34.8 IU/mL
uE3 MoM: 1.17

## 2015-12-25 ENCOUNTER — Ambulatory Visit (INDEPENDENT_AMBULATORY_CARE_PROVIDER_SITE_OTHER): Payer: BC Managed Care – PPO | Admitting: Obstetrics and Gynecology

## 2015-12-25 ENCOUNTER — Encounter: Payer: Self-pay | Admitting: Obstetrics and Gynecology

## 2015-12-25 VITALS — BP 130/78 | HR 104

## 2015-12-25 DIAGNOSIS — Z1389 Encounter for screening for other disorder: Secondary | ICD-10-CM

## 2015-12-25 DIAGNOSIS — Z331 Pregnant state, incidental: Secondary | ICD-10-CM

## 2015-12-25 DIAGNOSIS — R350 Frequency of micturition: Secondary | ICD-10-CM

## 2015-12-25 DIAGNOSIS — O09292 Supervision of pregnancy with other poor reproductive or obstetric history, second trimester: Secondary | ICD-10-CM

## 2015-12-25 LAB — POCT URINALYSIS DIPSTICK
Glucose, UA: NEGATIVE
Leukocytes, UA: NEGATIVE
NITRITE UA: NEGATIVE
PROTEIN UA: NEGATIVE
RBC UA: NEGATIVE

## 2015-12-25 NOTE — Progress Notes (Signed)
Pt worked in today for urinary frequency and lower abdominal pain and pressure.

## 2015-12-25 NOTE — Progress Notes (Signed)
Patient ID: Kimberly Morgan, female   DOB: [birth year], 35 y.o.   MRN: [id number]  Work in [employer]   High Risk Pregnancy HROB Diagnosis(es):   Preterm delivery at 22+6 weeks, cerclage with last pregnancy resulting in term delivery, s/p cerclage placement on 11/25/15  G3P1101 [redacted]w[redacted]d Estimated Date of Delivery: 05/30/16    HPI: The patient is being seen today for ongoing management of the above.  Today she reports moderate pelvic pressure, which is worsened after urination.   Patient reports good fetal movement, denies any bleeding and no rupture of membranes symptoms or regular contractions.   BP weight and urine results reviewed and noted. Blood pressure 130/78, pulse (!) 104, last menstrual period 05/09/2014, currently breastfeeding.  Fetal Heart rate:  155 bpm  Physical Examination: Abdomen - soft, nontender, nondistended, no masses or organomegaly                                     Pelvic - VULVA: normal appearing vulva with no masses, tenderness or lesions, VAGINA: normal appearing vagina with normal color and discharge, no lesions, CERVIX: normal appearing cervix without discharge or lesions Cervix visually long, closed, high; palpably closed, long and firm. Cerclage stitch in place.                                      Edema:  none  Urinalysis:NEGATIVE for glucose, protein, nitrite, blood                  POSITIVE for moderate ketones  Fetal Surveillance Testing today:  none  Lab and sonogram results have been reviewed. Comments: UA + moderate ketones   Assessment:  1.  Pregnancy at [redacted]w[redacted]d,  G3P1101   :  Estimated Date of Delivery: 05/30/16                         2.  Preterm delivery at 22+6 weeks, cerclage with last pregnancy resulting in term delivery, s/p cerclage placement on 11/25/15                        3. Pelvic pressure worsened after urination   4. Cerclage stitch in place    Medication(s) Plans:  Prometrium starting at 16 weeks   Treatment Plan:  Fetal US at next visit,  continue Prometrium   Follow up in 4 weeks for appointment for high risk OB care, fetal US    By signing my name below, I, Doreatha Martin, attest that this documentation has been prepared under the direction and in the presence of [name], MD. Electronically Signed: Doreatha Martin, ED Scribe. 12/25/15. 3:35 PM.  I personally performed the services described in this documentation, which was SCRIBED in my presence. The recorded information has been reviewed and considered accurate. It has been edited as necessary during review. [name], MD

## 2016-01-01 ENCOUNTER — Encounter: Payer: BC Managed Care – PPO | Admitting: Obstetrics and Gynecology

## 2016-01-01 ENCOUNTER — Other Ambulatory Visit: Payer: BC Managed Care – PPO

## 2016-01-08 ENCOUNTER — Ambulatory Visit (INDEPENDENT_AMBULATORY_CARE_PROVIDER_SITE_OTHER): Payer: BC Managed Care – PPO | Admitting: Obstetrics and Gynecology

## 2016-01-08 ENCOUNTER — Encounter: Payer: Self-pay | Admitting: Obstetrics and Gynecology

## 2016-01-08 ENCOUNTER — Other Ambulatory Visit: Payer: Self-pay | Admitting: Obstetrics and Gynecology

## 2016-01-08 ENCOUNTER — Ambulatory Visit (INDEPENDENT_AMBULATORY_CARE_PROVIDER_SITE_OTHER): Payer: BC Managed Care – PPO

## 2016-01-08 VITALS — BP 120/76 | HR 90 | Wt 211.0 lb

## 2016-01-08 DIAGNOSIS — Z1389 Encounter for screening for other disorder: Secondary | ICD-10-CM

## 2016-01-08 DIAGNOSIS — O09292 Supervision of pregnancy with other poor reproductive or obstetric history, second trimester: Secondary | ICD-10-CM | POA: Diagnosis not present

## 2016-01-08 DIAGNOSIS — Z3A20 20 weeks gestation of pregnancy: Secondary | ICD-10-CM

## 2016-01-08 DIAGNOSIS — O0992 Supervision of high risk pregnancy, unspecified, second trimester: Secondary | ICD-10-CM

## 2016-01-08 DIAGNOSIS — Z9889 Other specified postprocedural states: Secondary | ICD-10-CM

## 2016-01-08 DIAGNOSIS — Z331 Pregnant state, incidental: Secondary | ICD-10-CM

## 2016-01-08 NOTE — Progress Notes (Signed)
Pt denies any problems or concerns at this time.  

## 2016-01-08 NOTE — Progress Notes (Signed)
Patient ID: Kimberly Morgan, female   DOB: 02-08-1981, 35 y.o.   MRN: [id number]   High Risk Pregnancy HROB Diagnosis(es):   Preterm delivery at 22+6 weeks, cerclage with last pregnancy resulting in term delivery,s/p cerclage placement on 11/25/15 Hx cervical incompetence  G3P1101 3758w4d Estimated Date of Delivery: 05/30/16    BP weight and urine results reviewed and noted. Blood pressure 120/76, pulse 90, weight 211 lb (95.7 kg), last menstrual period 05/09/2014, currently breastfeeding.  Urinalysis: not done   HPI: The patient is being seen today for ongoing management of the above.  Chief Complaint  Patient presents with  . Routine Prenatal Visit     Today she reports she is doing well.   Patient reports good fetal movement, denies any bleeding and no rupture of membranes symptoms or regular contractions.                                      Edema:  none  Fetal Surveillance Testing today:  14 wk US and transvaginal US  Lab and sonogram results have been reviewed. Comments: normal   Assessment:  1.  Pregnancy at 7758w4d,  G3P1101   :  Estimated Date of Delivery: 05/30/16                         2.  Preterm delivery at 22+6 weeks, cerclage with last pregnancy resulting in term delivery,s/p cerclage placement on 11/25/15                        3. US today normal anatomy and normal cervical length.  Medication(s) Plans:  Prometrium   Treatment Plan:  Continued prenatal care   Follow up in 4 weeks for appointment for high risk OB care   By signing my name below, I, Kimberly Morgan, attest that this documentation has been prepared under the direction and in the presence of Kimberly BurrowJohn Morgan Jadalyn Oliveri, MD. Electronically Signed: Doreatha MartinEva Morgan, ED Scribe. 01/08/16. 11:12 AM.  I personally performed the services described in this documentation, which was SCRIBED in my presence. The recorded information has been reviewed and considered accurate. It has been edited as necessary during review. Kimberly BurrowFERGUSON,Kimberly Morgan  V, MD

## 2016-01-08 NOTE — Progress Notes (Signed)
US 19+4 wks,cephalic,cx 4.7 cm w/and w/o pressure,normal ov's bilat,post pl gr 0,svp of fluid 4.7 cm,fhr 150 bpm,efw 319 g ,anatomy complete,no obvious abnormalities seen

## 2016-01-13 ENCOUNTER — Other Ambulatory Visit: Payer: BC Managed Care – PPO

## 2016-02-05 ENCOUNTER — Ambulatory Visit (INDEPENDENT_AMBULATORY_CARE_PROVIDER_SITE_OTHER): Payer: BC Managed Care – PPO | Admitting: Obstetrics and Gynecology

## 2016-02-05 ENCOUNTER — Other Ambulatory Visit (INDEPENDENT_AMBULATORY_CARE_PROVIDER_SITE_OTHER): Payer: BC Managed Care – PPO

## 2016-02-05 ENCOUNTER — Other Ambulatory Visit: Payer: Self-pay | Admitting: Obstetrics and Gynecology

## 2016-02-05 VITALS — BP 128/72 | HR 104 | Wt 217.8 lb

## 2016-02-05 DIAGNOSIS — Z3A24 24 weeks gestation of pregnancy: Secondary | ICD-10-CM | POA: Diagnosis not present

## 2016-02-05 DIAGNOSIS — Z36 Encounter for antenatal screening of mother: Secondary | ICD-10-CM

## 2016-02-05 DIAGNOSIS — Z331 Pregnant state, incidental: Secondary | ICD-10-CM

## 2016-02-05 DIAGNOSIS — Z0375 Encounter for suspected cervical shortening ruled out: Secondary | ICD-10-CM

## 2016-02-05 DIAGNOSIS — O09892 Supervision of other high risk pregnancies, second trimester: Secondary | ICD-10-CM

## 2016-02-05 DIAGNOSIS — Z1389 Encounter for screening for other disorder: Secondary | ICD-10-CM

## 2016-02-05 DIAGNOSIS — O09292 Supervision of pregnancy with other poor reproductive or obstetric history, second trimester: Secondary | ICD-10-CM

## 2016-02-05 DIAGNOSIS — O26872 Cervical shortening, second trimester: Secondary | ICD-10-CM | POA: Diagnosis not present

## 2016-02-05 DIAGNOSIS — O99282 Endocrine, nutritional and metabolic diseases complicating pregnancy, second trimester: Secondary | ICD-10-CM

## 2016-02-05 DIAGNOSIS — E02 Subclinical iodine-deficiency hypothyroidism: Secondary | ICD-10-CM

## 2016-02-05 DIAGNOSIS — O09812 Supervision of pregnancy resulting from assisted reproductive technology, second trimester: Secondary | ICD-10-CM

## 2016-02-05 DIAGNOSIS — E039 Hypothyroidism, unspecified: Secondary | ICD-10-CM

## 2016-02-05 LAB — POCT URINALYSIS DIPSTICK
Blood, UA: NEGATIVE
GLUCOSE UA: NEGATIVE
Ketones, UA: NEGATIVE
LEUKOCYTES UA: NEGATIVE
NITRITE UA: NEGATIVE
PROTEIN UA: NEGATIVE
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
## 2016-02-05 NOTE — Progress Notes (Signed)
US 23+4 wks,cephalic,post pl gr 0,fhr 156 bpm,svp of fluid 5.8 cm,cx length 4.9 cm w/ and w/o pressure.

## 2016-03-04 ENCOUNTER — Other Ambulatory Visit: Payer: BC Managed Care – PPO

## 2016-03-04 ENCOUNTER — Telehealth: Payer: Self-pay | Admitting: *Deleted

## 2016-03-04 ENCOUNTER — Ambulatory Visit (INDEPENDENT_AMBULATORY_CARE_PROVIDER_SITE_OTHER): Payer: BC Managed Care – PPO | Admitting: Obstetrics and Gynecology

## 2016-03-04 ENCOUNTER — Encounter: Payer: Self-pay | Admitting: Obstetrics and Gynecology

## 2016-03-04 VITALS — BP 130/70 | HR 88 | Wt 220.4 lb

## 2016-03-04 DIAGNOSIS — Z1389 Encounter for screening for other disorder: Secondary | ICD-10-CM

## 2016-03-04 DIAGNOSIS — Z131 Encounter for screening for diabetes mellitus: Secondary | ICD-10-CM

## 2016-03-04 DIAGNOSIS — O09292 Supervision of pregnancy with other poor reproductive or obstetric history, second trimester: Secondary | ICD-10-CM

## 2016-03-04 DIAGNOSIS — O99282 Endocrine, nutritional and metabolic diseases complicating pregnancy, second trimester: Secondary | ICD-10-CM

## 2016-03-04 DIAGNOSIS — O09812 Supervision of pregnancy resulting from assisted reproductive technology, second trimester: Secondary | ICD-10-CM

## 2016-03-04 DIAGNOSIS — Z3483 Encounter for supervision of other normal pregnancy, third trimester: Secondary | ICD-10-CM

## 2016-03-04 DIAGNOSIS — O09892 Supervision of other high risk pregnancies, second trimester: Secondary | ICD-10-CM

## 2016-03-04 DIAGNOSIS — Z331 Pregnant state, incidental: Secondary | ICD-10-CM

## 2016-03-04 LAB — POCT URINALYSIS DIPSTICK
Blood, UA: NEGATIVE
Glucose, UA: NEGATIVE
Ketones, UA: NEGATIVE
LEUKOCYTES UA: NEGATIVE
Nitrite, UA: NEGATIVE
PROTEIN UA: NEGATIVE
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
## 2016-03-04 NOTE — Progress Notes (Signed)
High Risk Pregnancy HROB Diagnosis(es):   Preterm delivery at 22+6 weeks, cerclage with last pregnancy resulting in term delivery,s/p cerclage placement on 11/25/15; Hx cervical incompetence  G3P1101 3019w4d Estimated Date of Delivery: 05/30/16    HPI: The patient is being seen today for ongoing management of the above. Today she reports no complaints.  Patient reports good fetal movement, denies any bleeding and no rupture of membranes symptoms or regular contractions.   BP weight and urine results reviewed and noted. Blood pressure 130/70, pulse 88, weight 220 lb 6.4 oz (100 kg), last menstrual period 05/09/2014, currently breastfeeding.  Fundal Height:  30 cm Fetal Heart rate:  153 Physical Examination: Abdomen - normal                                     Pelvic - examination not indicated                                     Edema:  trace  Urinalysis:NEGATIVE   Fetal Surveillance Testing today:  None today  Lab and sonogram results have been reviewed. Comments: none   Assessment:  1.  Pregnancy at 3119w4d,  [id number]   :  Estimated Date of Delivery: 05/30/16                        2.  Preterm delivery at 22+6 weeks, cerclage with last pregnancy resulting in term delivery,s/p cerclage placement on 11/25/15; Hx cervical incompetence                        3. Cerclage stitch in place   4. No signs or symptoms of preterm labor  Medication(s) Plans:  Continue prometrium  Treatment Plan:  Maintain cerclage until 37 weeks  Follow up in 4 weeks for appointment for high risk OB care

## 2016-03-05 LAB — CBC
HEMATOCRIT: 36.5 % (ref 34.0–46.6)
HEMOGLOBIN: 12 g/dL (ref 11.1–15.9)
MCH: 29.5 pg (ref 26.6–33.0)
MCHC: 32.9 g/dL (ref 31.5–35.7)
MCV: 90 fL (ref 79–97)
Platelets: 241 10*3/uL (ref 150–379)
RBC: 4.07 x10E6/uL (ref 3.77–5.28)
RDW: 13.6 % (ref 12.3–15.4)
WBC: 13.3 10*3/uL — ABNORMAL HIGH (ref 3.4–10.8)

## 2016-03-05 LAB — GLUCOSE TOLERANCE, 2 HOURS W/ 1HR
GLUCOSE, 2 HOUR: 147 mg/dL (ref 65–152)
Glucose, 1 hour: 155 mg/dL (ref 65–179)
Glucose, Fasting: 84 mg/dL (ref 65–91)

## 2016-03-05 LAB — ANTIBODY SCREEN: ANTIBODY SCREEN: NEGATIVE

## 2016-03-05 LAB — RPR: RPR Ser Ql: NONREACTIVE

## 2016-03-05 LAB — HIV ANTIBODY (ROUTINE TESTING W REFLEX): HIV Screen 4th Generation wRfx: NONREACTIVE

## 2016-03-25 NOTE — Telephone Encounter (Signed)
Pt was advised to mention this note at next appt. Pt voiced understanding. JSY

## 2016-04-01 ENCOUNTER — Ambulatory Visit (INDEPENDENT_AMBULATORY_CARE_PROVIDER_SITE_OTHER): Payer: BC Managed Care – PPO | Admitting: Obstetrics and Gynecology

## 2016-04-01 ENCOUNTER — Encounter: Payer: Self-pay | Admitting: Obstetrics and Gynecology

## 2016-04-01 VITALS — BP 130/76 | HR 92 | Wt 224.6 lb

## 2016-04-01 DIAGNOSIS — O3433 Maternal care for cervical incompetence, third trimester: Secondary | ICD-10-CM

## 2016-04-01 DIAGNOSIS — Z1389 Encounter for screening for other disorder: Secondary | ICD-10-CM

## 2016-04-01 DIAGNOSIS — O0993 Supervision of high risk pregnancy, unspecified, third trimester: Secondary | ICD-10-CM

## 2016-04-01 DIAGNOSIS — Z331 Pregnant state, incidental: Secondary | ICD-10-CM

## 2016-04-01 DIAGNOSIS — Z3A32 32 weeks gestation of pregnancy: Secondary | ICD-10-CM

## 2016-04-01 LAB — POCT URINALYSIS DIPSTICK
Glucose, UA: NEGATIVE
KETONES UA: NEGATIVE
Leukocytes, UA: NEGATIVE
Nitrite, UA: NEGATIVE
Protein, UA: NEGATIVE
RBC UA: NEGATIVE

## 2016-04-01 NOTE — Progress Notes (Signed)
Patient ID: Kimberly Morgan, female   DOB: 02/25/1981, 35 y.o.   MRN: [id number]   High Risk Pregnancy HROB Diagnosis(es):   Preterm delivery at 22+6 weeks, cerclage with last pregnancy resulting in term delivery,s/p cerclage placement on 11/25/15; Hx cervical incompetence  G3P1101 1523w4d Estimated Date of Delivery: 05/30/16    BP weight and urine results reviewed and noted. Blood pressure 130/76, pulse 92, weight 224 lb 9.6 oz (101.9 kg), last menstrual period 05/09/2014, currently breastfeeding.  Urinalysis:NEGATIVE for all  HPI: The patient is being seen today for ongoing management of the above.  Chief Complaint  Patient presents with  . Routine Prenatal Visit     Today she reports she is doing well  Patient reports good fetal movement, denies any bleeding and no rupture of membranes symptoms or regular contractions.   Fundal Height:  34 cm Fetal Heart rate:  138 bpm Physical Examination: Abdomen - soft, nontender, nondistended, no masses or organomegaly                                     Edema:  none  Fetal Surveillance Testing today:  none  Lab and sonogram results have been reviewed. Comments: not examined  Assessment:  1.  Pregnancy at 2523w4d,  G3P1101   :  Estimated Date of Delivery: 05/30/16                         2.  Preterm delivery at 22+6 weeks, cerclage with last pregnancy resulting in term delivery,s/p cerclage placement on 11/25/15; Hx cervical incompetence                          Medication(s) Plans:  Continue Prometrium   Treatment Plan:  Maintain cerclage until 37 weeks                               Continue prometrium Follow up in 2 weeks for appointment for high risk OB care   By signing my name below, I, Doreatha MartinEva Mathews, attest that this documentation has been prepared under the direction and in the presence of Tilda BurrowJohn V Tangy Drozdowski, MD. Electronically Signed: Doreatha MartinEva Mathews, ED Scribe. 04/01/16. 10:22 AM.  I personally performed the services described in this  documentation, which was SCRIBED in my presence. The recorded information has been reviewed and considered accurate. It has been edited as necessary during review. Tilda BurrowFERGUSON,Donnamarie Shankles V, MD

## 2016-04-15 ENCOUNTER — Ambulatory Visit (INDEPENDENT_AMBULATORY_CARE_PROVIDER_SITE_OTHER): Payer: BC Managed Care – PPO | Admitting: Obstetrics and Gynecology

## 2016-04-15 ENCOUNTER — Encounter: Payer: Self-pay | Admitting: Obstetrics and Gynecology

## 2016-04-15 VITALS — BP 136/84 | HR 80 | Wt 225.8 lb

## 2016-04-15 DIAGNOSIS — O0993 Supervision of high risk pregnancy, unspecified, third trimester: Secondary | ICD-10-CM

## 2016-04-15 DIAGNOSIS — O3433 Maternal care for cervical incompetence, third trimester: Secondary | ICD-10-CM

## 2016-04-15 DIAGNOSIS — Z3A34 34 weeks gestation of pregnancy: Secondary | ICD-10-CM

## 2016-04-15 DIAGNOSIS — Z331 Pregnant state, incidental: Secondary | ICD-10-CM

## 2016-04-15 DIAGNOSIS — Z1389 Encounter for screening for other disorder: Secondary | ICD-10-CM

## 2016-04-15 LAB — POCT URINALYSIS DIPSTICK
Blood, UA: NEGATIVE
GLUCOSE UA: NEGATIVE
KETONES UA: NEGATIVE
Nitrite, UA: NEGATIVE
PROTEIN UA: NEGATIVE

## 2016-04-15 NOTE — Progress Notes (Signed)
Patient ID: Kimberly Morgan, female   DOB: February 17, 1981, 35 y.o.   MRN: [id number]   High Risk Pregnancy HROB Diagnosis(es):   Preterm delivery at 22+6 weeks, cerclage with last pregnancy resulting in term delivery,s/p cerclage placement on 11/25/15; Hx cervical incompetence  G3P1101 8531w4d Estimated Date of Delivery: 05/30/16    BP weight and urine results reviewed and noted. Blood pressure 136/84, pulse 80, weight 225 lb 12.8 oz (102.4 kg), last menstrual period 05/09/2014, currently breastfeeding.  Urinalysis:NEGATIVE for protein, glucose, nitrite                  POSITIVE for 2+ leukocytes   HPI: The patient is being seen today for ongoing management of the above.  Chief Complaint  Patient presents with  . Routine Prenatal Visit    has yeast infection, on third day of Monistat 7     Today she reports she is doing well  Patient reports good fetal movement, denies any bleeding and no rupture of membranes symptoms or regular contractions.   Fundal Height:  36 cm Fetal Heart rate:  173 bpm Physical Examination: Abdomen - soft, nontender, nondistended, no masses or organomegaly                                     Edema:  none  Fetal Surveillance Testing today:  none  Lab and sonogram results have been reviewed. Comments: not done   Assessment:  1.  Pregnancy at 1131w4d,  G3P1101   :  Estimated Date of Delivery: 05/30/16                         2.  Preterm delivery at 22+6 weeks, cerclage with last pregnancy resulting in term delivery,s/p cerclage placement on 11/25/15; Hx cervical incompetence                        Medication(s) Plans:  Continue Prometrium   Treatment Plan:  Manage cerclage until 37 weeks, continue Prometrium   Follow up in 2 weeks for appointment for high risk OB care, cerclage removal in ~3 weeks    By signing my name below, I, Doreatha MartinEva Mathews, attest that this documentation has been prepared under the direction and in the presence of Tilda BurrowJohn V Debrah Granderson,  MD. Electronically Signed: Doreatha MartinEva Mathews, ED Scribe. 04/15/16. 11:56 AM.  I personally performed the services described in this documentation, which was SCRIBED in my presence. The recorded information has been reviewed and considered accurate. It has been edited as necessary during review. Tilda BurrowFERGUSON,Telisha Zawadzki V, MD

## 2016-04-29 ENCOUNTER — Ambulatory Visit (INDEPENDENT_AMBULATORY_CARE_PROVIDER_SITE_OTHER): Payer: BC Managed Care – PPO | Admitting: Advanced Practice Midwife

## 2016-04-29 ENCOUNTER — Encounter: Payer: Self-pay | Admitting: Advanced Practice Midwife

## 2016-04-29 VITALS — BP 130/80 | HR 76 | Wt 229.0 lb

## 2016-04-29 DIAGNOSIS — Z1389 Encounter for screening for other disorder: Secondary | ICD-10-CM

## 2016-04-29 DIAGNOSIS — Z331 Pregnant state, incidental: Secondary | ICD-10-CM

## 2016-04-29 DIAGNOSIS — O368130 Decreased fetal movements, third trimester, not applicable or unspecified: Secondary | ICD-10-CM

## 2016-04-29 DIAGNOSIS — O26843 Uterine size-date discrepancy, third trimester: Secondary | ICD-10-CM

## 2016-04-29 DIAGNOSIS — Z3483 Encounter for supervision of other normal pregnancy, third trimester: Secondary | ICD-10-CM

## 2016-04-29 DIAGNOSIS — Z3A35 35 weeks gestation of pregnancy: Secondary | ICD-10-CM

## 2016-04-29 DIAGNOSIS — O0993 Supervision of high risk pregnancy, unspecified, third trimester: Secondary | ICD-10-CM

## 2016-04-29 LAB — POCT URINALYSIS DIPSTICK
Blood, UA: NEGATIVE
GLUCOSE UA: NEGATIVE
Ketones, UA: NEGATIVE
LEUKOCYTES UA: NEGATIVE
NITRITE UA: NEGATIVE

## 2016-04-29 NOTE — Patient Instructions (Signed)
AM I IN LABOR? What is labor? Labor is the work that your body does to birth your baby. Your uterus (the womb) contracts. Your cervix (the mouth of the uterus) opens. You will push your baby out into the world.  What do contractions (labor pains) feel like? When they first start, contractions usually feel like cramps during your period. Sometimes you feel pain in your back. Most often, contractions feel like muscles pulling painfully in your lower belly. At first, the contractions will probably be 15 to 20 minutes apart. They will not feel too painful. As labor goes on, the contractions get stronger, closer together, and more painful.  How do I time the contractions? Time your contractions by counting the number of minutes from the start of one contraction to the start of the next contraction.  What should I do when the contractions start? If it is night and you can sleep, sleep. If it happens during the day, here are some things you can do to take care of yourself at home: ? Walk. If the pains you are having are real labor, walking will make the contractions come faster and harder. If the contractions are not going to continue and be real labor, walking will make the contractions slow down. ? Take a shower or bath. This will help you relax. ? Eat. Labor is a big event. It takes a lot of energy. ? Drink water. Not drinking enough water can cause false labor (contractions that hurt but do not open your cervix). If this is true labor, drinking water will help you have strength to get through your labor. ? Take a nap. Get all the rest you can. ? Get a massage. If your labor is in your back, a strong massage on your lower back may feel very good. Getting a foot massage is always good. ? Don't panic. You can do this. Your body was made for this. You are strong!  When should I go to the hospital or call my health care provider? ? Your contractions have been 5 minutes apart or less for at least 1  hour. ? If several contractions are so painful you cannot walk or talk during one. ? Your bag of waters breaks. (You may have a big gush of water or just water that runs down your legs when you walk.)  Are there other reasons to call my health care provider? Yes, you should call your health care provider or go to the hospital if you start to bleed like you are having a period- blood that soaks your underwear or runs down your legs, if you have sudden severe pain, if your baby has not moved for several hours, or if you are leaking green fluid. The rule is as follows: If you are very concerned about something, call.  Introduction Patient Name: ________________________________________________ Patient Due Date: ____________________ What is a fetal movement count? A fetal movement count is the number of times that you feel your baby move during a certain amount of time. This may also be called a fetal kick count. A fetal movement count is recommended for every pregnant woman. You may be asked to start counting fetal movements as early as week 28 of your pregnancy. Pay attention to when your baby is most active. You may notice your baby's sleep and wake cycles. You may also notice things that make your baby move more. You should do a fetal movement count:  When your baby is normally most active.  At  the same time each day. A good time to count movements is while you are resting, after having something to eat and drink. How do I count fetal movements? 1. Find a quiet, comfortable area. Sit, or lie down on your side. 2. Write down the date, the start time and stop time, and the number of movements that you felt between those two times. Take this information with you to your health care visits. 3. For 2 hours, count kicks, flutters, swishes, rolls, and jabs. You should feel at least 10 movements during 2 hours. 4. You may stop counting after you have felt 10 movements. 5. If you do not feel 10 movements  in 2 hours, have something to eat and drink. Then, keep resting and counting for 1 hour. If you feel at least 4 movements during that hour, you may stop counting. Contact a health care provider if:  You feel fewer than 4 movements in 2 hours.  Your baby is not moving like he or she usually does. Date: ____________ Start time: ____________ Stop time: ____________ Movements: ____________ Date: ____________ Start time: ____________ Stop time: ____________ Movements: ____________ Date: ____________ Start time: ____________ Stop time: ____________ Movements: ____________ Date: ____________ Start time: ____________ Stop time: ____________ Movements: ____________ Date: ____________ Start time: ____________ Stop time: ____________ Movements: ____________ Date: ____________ Start time: ____________ Stop time: ____________ Movements: ____________ Date: ____________ Start time: ____________ Stop time: ____________ Movements: ____________ Date: ____________ Start time: ____________ Stop time: ____________ Movements: ____________ Date: ____________ Start time: ____________ Stop time: ____________ Movements: ____________ This information is not intended to replace advice given to you by your health care provider. Make sure you discuss any questions you have with your health care provider. Document Released: 06/10/2006 Document Revised: 01/08/2016 Document Reviewed: 06/20/2015 Elsevier Interactive Patient Education  2017 [employer]

## 2016-04-29 NOTE — Progress Notes (Signed)
[id number] 8136w4d Estimated Date of Delivery: 05/30/16  Blood pressure 130/80, pulse 76, weight 229 lb (103.9 kg), last menstrual period 05/09/2014, currently breastfeeding.   BP weight and urine results all reviewed and noted.  Please refer to the obstetrical flow sheet for the fundal height and fetal heart rate documentation:  Patient reports decreased fetal movement,\; NST reactive and baby active now Size>dates.    denies any bleeding and no rupture of membranes symptoms or regular contractions. Patient is without complaints. All questions were answered.  Orders Placed This Encounter  Procedures  . US OB Follow Up  . POCT urinalysis dipstick    Plan:  Continued routine obstetrical care,   Return for has appt 12/11 w/JVF   see if we can do an EFW US that same day/time. If not , scheduld for 1 week.   Kick counts discussed

## 2016-05-04 ENCOUNTER — Encounter: Payer: Self-pay | Admitting: Obstetrics and Gynecology

## 2016-05-04 ENCOUNTER — Ambulatory Visit (INDEPENDENT_AMBULATORY_CARE_PROVIDER_SITE_OTHER): Payer: BC Managed Care – PPO

## 2016-05-04 ENCOUNTER — Ambulatory Visit (INDEPENDENT_AMBULATORY_CARE_PROVIDER_SITE_OTHER): Payer: BC Managed Care – PPO | Admitting: Obstetrics and Gynecology

## 2016-05-04 VITALS — BP 140/84 | HR 101 | Wt 229.8 lb

## 2016-05-04 DIAGNOSIS — E039 Hypothyroidism, unspecified: Secondary | ICD-10-CM

## 2016-05-04 DIAGNOSIS — O0993 Supervision of high risk pregnancy, unspecified, third trimester: Secondary | ICD-10-CM

## 2016-05-04 DIAGNOSIS — O26843 Uterine size-date discrepancy, third trimester: Secondary | ICD-10-CM | POA: Diagnosis not present

## 2016-05-04 DIAGNOSIS — Z3A37 37 weeks gestation of pregnancy: Secondary | ICD-10-CM

## 2016-05-04 DIAGNOSIS — O09213 Supervision of pregnancy with history of pre-term labor, third trimester: Secondary | ICD-10-CM

## 2016-05-04 DIAGNOSIS — Z331 Pregnant state, incidental: Secondary | ICD-10-CM

## 2016-05-04 DIAGNOSIS — O99283 Endocrine, nutritional and metabolic diseases complicating pregnancy, third trimester: Secondary | ICD-10-CM

## 2016-05-04 DIAGNOSIS — Z1389 Encounter for screening for other disorder: Secondary | ICD-10-CM

## 2016-05-04 LAB — POCT URINALYSIS DIPSTICK
Glucose, UA: NEGATIVE
KETONES UA: NEGATIVE
Leukocytes, UA: NEGATIVE
NITRITE UA: NEGATIVE
RBC UA: NEGATIVE

## 2016-05-04 NOTE — Progress Notes (Signed)
Patient ID: Kimberly Morgan, female   DOB: 09/28/1980, 35 y.o.   MRN: [id number]   High Risk Pregnancy HROB Diagnosis(es):   Preterm delivery at 22+6 weeks, cerclage with last pregnancy resulting in term delivery,s/p cerclage placement on 11/25/15; Hx cervical incompetence  G3P1101 6763w2d Estimated Date of Delivery: 05/30/16    BP weight and urine results reviewed and noted. Blood pressure 140/84, pulse (!) 101, weight 229 lb 12.8 oz (104.2 kg), last menstrual period 05/09/2014, currently breastfeeding.  Urinalysis: POSITIVE for trace protein   HPI: The patient is being seen today for ongoing management of the above.   Today she reports she is doing well   Patient reports good fetal movement, denies any bleeding and no rupture of membranes symptoms or regular contractions.   Fundal Height:  37 cm Fetal Heart rate:  161 bpm Physical Examination: Abdomen - soft, nontender, nondistended, no masses or organomegaly                                     Edema:  none  Fetal Surveillance Testing today:  EFW us  Lab and sonogram results have been reviewed. Comments: 45.3% EFW  Assessment:  1.  Pregnancy at 9163w2d,  [id number]   :  Estimated Date of Delivery: 05/30/16                         2.  Preterm delivery at 22+6 weeks, cerclage with last pregnancy resulting in term delivery,s/p cerclage placement on 11/25/15; Hx cervical incompetence                        3. EFW US today= 46 %ile.    4. TSH today    Medication(s) Plans:  Continue Prometrium   Treatment Plan:  continue Prometrium  Follow up in 2 weeks for appointment for high risk OB care, cerclage removal in 4 days, BP recheck    By signing my name below, I, Doreatha MartinEva Mathews, attest that this documentation has been prepared under the direction and in the presence of Tilda BurrowJohn Morgan Courney Garrod, Kimberly Morgan. Electronically Signed: Doreatha MartinEva Mathews, ED Scribe. 05/04/16. 3:57 PM.  I personally performed the services described in this documentation, which was SCRIBED in  my presence. The recorded information has been reviewed and considered accurate. It has been edited as necessary during review. Tilda BurrowFERGUSON,Kimberly Tandy Morgan, Kimberly Morgan

## 2016-05-04 NOTE — Progress Notes (Signed)
US 36+2 wks,cephalic,post pl gr 1,fhr 161 bpm,bilat adnexa's wnl,EFW 2850 g 45%,afi 13.2 cm

## 2016-05-05 LAB — TSH: TSH: 0.316 u[IU]/mL — AB (ref 0.450–4.500)

## 2016-05-07 ENCOUNTER — Encounter: Payer: Self-pay | Admitting: Obstetrics and Gynecology

## 2016-05-08 ENCOUNTER — Encounter: Payer: Self-pay | Admitting: Obstetrics and Gynecology

## 2016-05-08 ENCOUNTER — Ambulatory Visit (INDEPENDENT_AMBULATORY_CARE_PROVIDER_SITE_OTHER): Payer: BC Managed Care – PPO | Admitting: Obstetrics and Gynecology

## 2016-05-08 VITALS — BP 124/86 | HR 76 | Wt 229.0 lb

## 2016-05-08 DIAGNOSIS — Z3A37 37 weeks gestation of pregnancy: Secondary | ICD-10-CM

## 2016-05-08 DIAGNOSIS — O99283 Endocrine, nutritional and metabolic diseases complicating pregnancy, third trimester: Secondary | ICD-10-CM

## 2016-05-08 DIAGNOSIS — Z331 Pregnant state, incidental: Secondary | ICD-10-CM

## 2016-05-08 DIAGNOSIS — Z3483 Encounter for supervision of other normal pregnancy, third trimester: Secondary | ICD-10-CM

## 2016-05-08 DIAGNOSIS — O09213 Supervision of pregnancy with history of pre-term labor, third trimester: Secondary | ICD-10-CM | POA: Diagnosis not present

## 2016-05-08 DIAGNOSIS — Z1389 Encounter for screening for other disorder: Secondary | ICD-10-CM

## 2016-05-08 DIAGNOSIS — O0993 Supervision of high risk pregnancy, unspecified, third trimester: Secondary | ICD-10-CM

## 2016-05-08 DIAGNOSIS — Z029 Encounter for administrative examinations, unspecified: Secondary | ICD-10-CM

## 2016-05-08 LAB — POCT URINALYSIS DIPSTICK
GLUCOSE UA: NEGATIVE
Ketones, UA: NEGATIVE
LEUKOCYTES UA: NEGATIVE
NITRITE UA: NEGATIVE
Protein, UA: NEGATIVE
RBC UA: NEGATIVE

## 2016-05-08 NOTE — Progress Notes (Signed)
[id number]  Estimated Date of Delivery: 05/30/16 LROB 6690w6d CERCLAGE REMOVAL AND GBS/GC/CHL  Blood pressure 124/86, pulse 76, weight 229 lb (103.9 kg), last menstrual period 05/09/2014, currently breastfeeding.   Urine results:notable for NEG PROTEIN  Chief Complaint  Patient presents with  . Routine Prenatal Visit    GBS, GC/CHL    Patient complaints:RARELY FEELS cramping. Patient reports   good fetal movement,                           denies any bleeding , rupture of membranes,or regular contractions.   refer to the ob flow sheet for FH 37 and FHR, ,161                          Physical Examination: General appearance - alert, well appearing, and in no distress                                      Abdomen - FH  ,                                                         -FHR                                                          soft, nontender, nondistended, no masses or organomegaly Gravid uterus                                      Pelvic - CERVIX: normal appearing cervix without discharge or lesions, CERCLAGE IDENTIFIED, GRASPED, AND REMOVED AFTER CUTTING AT KNOT.               CX 1.5 CM 50/-2                             Questions were answered. Assessment: LROB G3P1101 @ 4090w6d , CERCLAGE REMOVAL  Plan:  Continued routine obstetrical care,   F/u in 1 weeks for ROUTINE CHECK

## 2016-05-10 LAB — STREP GP B NAA: Strep Gp B NAA: NEGATIVE

## 2016-05-13 ENCOUNTER — Telehealth: Payer: Self-pay | Admitting: Obstetrics and Gynecology

## 2016-05-13 NOTE — Telephone Encounter (Signed)
Patient called with complaints of hemorrhoids. She has tried using witch hazel pads, preparation H and sitz baths and nothing is helping. I advised her that she could "tuck" the witch hazel pads next to the hemorrhoids and leave them. She could also try Anusol cream if she wanted. Pt verbalized understanding.

## 2016-05-14 LAB — GC/CHLAMYDIA PROBE AMP
Chlamydia trachomatis, NAA: NEGATIVE
NEISSERIA GONORRHOEAE BY PCR: NEGATIVE

## 2016-05-15 ENCOUNTER — Ambulatory Visit (INDEPENDENT_AMBULATORY_CARE_PROVIDER_SITE_OTHER): Payer: BC Managed Care – PPO | Admitting: Obstetrics and Gynecology

## 2016-05-15 ENCOUNTER — Encounter: Payer: Self-pay | Admitting: Obstetrics and Gynecology

## 2016-05-15 VITALS — BP 130/88 | HR 108 | Wt 232.0 lb

## 2016-05-15 DIAGNOSIS — Z331 Pregnant state, incidental: Secondary | ICD-10-CM

## 2016-05-15 DIAGNOSIS — O0993 Supervision of high risk pregnancy, unspecified, third trimester: Secondary | ICD-10-CM

## 2016-05-15 DIAGNOSIS — Z3A38 38 weeks gestation of pregnancy: Secondary | ICD-10-CM

## 2016-05-15 DIAGNOSIS — Z1389 Encounter for screening for other disorder: Secondary | ICD-10-CM

## 2016-05-15 LAB — POCT URINALYSIS DIPSTICK
Blood, UA: NEGATIVE
Glucose, UA: NEGATIVE
Ketones, UA: NEGATIVE
LEUKOCYTES UA: NEGATIVE
NITRITE UA: NEGATIVE

## 2016-05-15 NOTE — Progress Notes (Signed)
[id number]  Estimated Date of Delivery: 05/30/16 LROB 44102w6d  Blood pressure 130/88, pulse (!) 108, weight 232 lb (105.2 kg), last menstrual period 05/09/2014, currently breastfeeding.   Urine results:notable for trace protein  Chief Complaint  Patient presents with  . High Risk Gestation    contractions Wednesday pm; off and on since then    Patient has no new complaints at this time. Patient reports good fetal movement, denies any bleeding , rupture of membranes,or regular contractions.   refer to the ob flow sheet for FH and FHR,                          Physical Examination: General appearance - alert, well appearing, and in no distress and oriented to person, place, and time                                      Abdomen - FH 41cm ,                                                         -FHR 137 bpm                                                         soft, nontender                                      Pelvic - CERVIX: 2.5 cm dilated, ~25% thinned out                                               Questions were answered. Assessment: LROB G3P1101 @ 98102w6d   Plan:  Continued routine obstetrical care  F/u in 1 week for routine care  By signing my name below, I, Freida Busmaniana Omoyeni, attest that this documentation has been prepared under the direction and in the presence of Tilda BurrowJohn V Pallie Swigert, MD . Electronically Signed: Freida Busmaniana Omoyeni, Scribe. 05/15/2016. 10:07 AM. I personally performed the services described in this documentation, which was SCRIBED in my presence. The recorded information has been reviewed and considered accurate. It has been edited as necessary during review. Tilda BurrowFERGUSON,Johonna Binette V, MD

## 2016-05-22 ENCOUNTER — Encounter: Payer: Self-pay | Admitting: Obstetrics & Gynecology

## 2016-05-22 ENCOUNTER — Ambulatory Visit (INDEPENDENT_AMBULATORY_CARE_PROVIDER_SITE_OTHER): Payer: BC Managed Care – PPO | Admitting: Obstetrics & Gynecology

## 2016-05-22 VITALS — BP 128/80 | HR 115 | Wt 229.4 lb

## 2016-05-22 DIAGNOSIS — Z1389 Encounter for screening for other disorder: Secondary | ICD-10-CM

## 2016-05-22 DIAGNOSIS — O0993 Supervision of high risk pregnancy, unspecified, third trimester: Secondary | ICD-10-CM

## 2016-05-22 DIAGNOSIS — Z331 Pregnant state, incidental: Secondary | ICD-10-CM

## 2016-05-22 DIAGNOSIS — O99213 Obesity complicating pregnancy, third trimester: Secondary | ICD-10-CM

## 2016-05-22 DIAGNOSIS — Z3A39 39 weeks gestation of pregnancy: Secondary | ICD-10-CM

## 2016-05-22 DIAGNOSIS — O99283 Endocrine, nutritional and metabolic diseases complicating pregnancy, third trimester: Secondary | ICD-10-CM

## 2016-05-22 LAB — POCT URINALYSIS DIPSTICK
Blood, UA: NEGATIVE
GLUCOSE UA: NEGATIVE
LEUKOCYTES UA: NEGATIVE
Nitrite, UA: NEGATIVE

## 2016-05-22 NOTE — Progress Notes (Signed)
[id number] 6826w6d Estimated Date of Delivery: 05/30/16  Blood pressure 128/80, pulse (!) 115, weight 229 lb 6.4 oz (104.1 kg), last menstrual period 05/09/2014, currently breastfeeding.   BP weight and urine results all reviewed and noted.  Please refer to the obstetrical flow sheet for the fundal height and fetal heart rate documentation:  Patient reports good fetal movement, denies any bleeding and no rupture of membranes symptoms or regular contractions. Patient is without complaints. All questions were answered.  Orders Placed This Encounter  Procedures  . POCT urinalysis dipstick    Plan:  Continued routine obstetrical care, 4/50/-2  Return in about 1 week (around 05/29/2016) for LROB.

## 2016-05-25 NOTE — L&D Delivery Note (Signed)
Delivery Note At 6:37 AM a viable female was delivered via  (Presentation: LOA ).  APGAR: 8,9 ; weight: 8+3.6 .  Infant w/ nuchal x 1, delivered through. No difficulty w/ delivery of shoulders. Infant dried and placed on pt's abd. Placenta status: spont , intact .  Cord:  3 vessel  Anesthesia:  Epidural Episiotomy:  None Lacerations:  None Est. Blood Loss (mL):  200  Mom to postpartum.  Baby to Couplet care / Skin to Skin.  Cam HaiSHAW, Kizzie Cotten CNM 06/06/2016, 6:50 AM

## 2016-05-29 ENCOUNTER — Encounter: Payer: Self-pay | Admitting: Obstetrics & Gynecology

## 2016-05-29 ENCOUNTER — Ambulatory Visit (INDEPENDENT_AMBULATORY_CARE_PROVIDER_SITE_OTHER): Payer: BC Managed Care – PPO | Admitting: Obstetrics & Gynecology

## 2016-05-29 VITALS — BP 128/73 | HR 108 | Wt 233.0 lb

## 2016-05-29 DIAGNOSIS — Z3A40 40 weeks gestation of pregnancy: Secondary | ICD-10-CM

## 2016-05-29 DIAGNOSIS — O99213 Obesity complicating pregnancy, third trimester: Secondary | ICD-10-CM | POA: Diagnosis not present

## 2016-05-29 DIAGNOSIS — O99283 Endocrine, nutritional and metabolic diseases complicating pregnancy, third trimester: Secondary | ICD-10-CM

## 2016-05-29 DIAGNOSIS — Z331 Pregnant state, incidental: Secondary | ICD-10-CM

## 2016-05-29 DIAGNOSIS — O09813 Supervision of pregnancy resulting from assisted reproductive technology, third trimester: Secondary | ICD-10-CM | POA: Diagnosis not present

## 2016-05-29 DIAGNOSIS — O480 Post-term pregnancy: Secondary | ICD-10-CM | POA: Diagnosis not present

## 2016-05-29 DIAGNOSIS — Z1389 Encounter for screening for other disorder: Secondary | ICD-10-CM

## 2016-05-29 DIAGNOSIS — O0993 Supervision of high risk pregnancy, unspecified, third trimester: Secondary | ICD-10-CM

## 2016-05-29 LAB — POCT URINALYSIS DIPSTICK
GLUCOSE UA: NEGATIVE
Ketones, UA: NEGATIVE
Leukocytes, UA: NEGATIVE
Nitrite, UA: NEGATIVE
Protein, UA: NEGATIVE
RBC UA: NEGATIVE

## 2016-05-29 NOTE — Treatment Plan (Signed)
Induction Assessment Scheduling Form: Fax to [facility]:  [phone]  Kimberly Morgan                                                                                   DOB:  Oct 25, 1980                                                            MRN:  [id number]                                                                     Phone #:   [phone]                         Provider:  [facility]  GP:  [id number]                                                            Estimated Date of Delivery: 05/30/16  Dating Criteria: 1st trimester sonogram    Medical Indications for induction:  Post dates Admission Date/Time:  06/05/2016 Gestational age on admission:  [redacted]w[redacted]d   Filed Weights   05/29/16 1310  Weight: 233 lb (105.7 kg)   HIV:  Non Reactive (10/11 0838) GBS: Negative (12/15 1200)  4.5 cm dilated, 70 effaced, -2 station, cervical position midplane, consistency soft, Bishop score 12, presenting part vertex   Method of induction(proposed):  pitocin   Scheduling Provider Signature:  Lazaro Arms, MD                                            Today's Date:  05/29/2016   Scheduled with Tabitha 1:49 PM 05/29/2016

## 2016-05-29 NOTE — Progress Notes (Signed)
[id number] 2962w6d Estimated Date of Delivery: 05/30/16  Blood pressure 128/73, pulse (!) 108, weight 233 lb (105.7 kg), last menstrual period 05/09/2014, currently breastfeeding.   BP weight and urine results all reviewed and noted.  Please refer to the obstetrical flow sheet for the fundal height and fetal heart rate documentation:  Patient reports good fetal movement, denies any bleeding and no rupture of membranes symptoms or regular contractions. Patient is without complaints. All questions were answered.  Orders Placed This Encounter  Procedures  . POCT urinalysis dipstick    Plan:  Continued routine obstetrical care, cervix 4-5/70/-2  Induce next Saturday if undelivered  Return in about 6 weeks (around 07/10/2016) for post partum visit.

## 2016-06-01 ENCOUNTER — Encounter (HOSPITAL_COMMUNITY): Payer: Self-pay | Admitting: *Deleted

## 2016-06-01 ENCOUNTER — Telehealth (HOSPITAL_COMMUNITY): Payer: Self-pay | Admitting: *Deleted

## 2016-06-01 NOTE — Telephone Encounter (Signed)
Preadmission screen  

## 2016-06-04 ENCOUNTER — Encounter: Payer: BC Managed Care – PPO | Admitting: Adult Health

## 2016-06-05 ENCOUNTER — Inpatient Hospital Stay (HOSPITAL_COMMUNITY): Payer: BC Managed Care – PPO | Admitting: Anesthesiology

## 2016-06-05 ENCOUNTER — Encounter (HOSPITAL_COMMUNITY): Payer: Self-pay

## 2016-06-05 ENCOUNTER — Inpatient Hospital Stay (HOSPITAL_COMMUNITY)
Admission: RE | Admit: 2016-06-05 | Discharge: 2016-06-07 | DRG: 775 | Disposition: A | Discharge status: Home / Self Care | Payer: BC Managed Care – PPO | Source: Ambulatory Visit | Attending: Obstetrics and Gynecology | Admitting: Obstetrics and Gynecology

## 2016-06-05 DIAGNOSIS — O99214 Obesity complicating childbirth: Secondary | ICD-10-CM | POA: Diagnosis present

## 2016-06-05 DIAGNOSIS — O99284 Endocrine, nutritional and metabolic diseases complicating childbirth: Secondary | ICD-10-CM | POA: Diagnosis present

## 2016-06-05 DIAGNOSIS — O3433 Maternal care for cervical incompetence, third trimester: Secondary | ICD-10-CM | POA: Diagnosis present

## 2016-06-05 DIAGNOSIS — Z87891 Personal history of nicotine dependence: Secondary | ICD-10-CM | POA: Diagnosis not present

## 2016-06-05 DIAGNOSIS — Z3A40 40 weeks gestation of pregnancy: Secondary | ICD-10-CM | POA: Diagnosis not present

## 2016-06-05 DIAGNOSIS — Z6839 Body mass index (BMI) 39.0-39.9, adult: Secondary | ICD-10-CM | POA: Diagnosis not present

## 2016-06-05 DIAGNOSIS — O480 Post-term pregnancy: Secondary | ICD-10-CM | POA: Diagnosis present

## 2016-06-05 DIAGNOSIS — E039 Hypothyroidism, unspecified: Secondary | ICD-10-CM | POA: Diagnosis present

## 2016-06-05 DIAGNOSIS — Z8249 Family history of ischemic heart disease and other diseases of the circulatory system: Secondary | ICD-10-CM

## 2016-06-05 DIAGNOSIS — Z833 Family history of diabetes mellitus: Secondary | ICD-10-CM

## 2016-06-05 LAB — CBC
HEMATOCRIT: 37.7 % (ref 36.0–46.0)
Hemoglobin: 13 g/dL (ref 12.0–15.0)
MCH: 30.8 pg (ref 26.0–34.0)
MCHC: 34.5 g/dL (ref 30.0–36.0)
MCV: 89.3 fL (ref 78.0–100.0)
Platelets: 231 10*3/uL (ref 150–400)
RBC: 4.22 MIL/uL (ref 3.87–5.11)
RDW: 14.4 % (ref 11.5–15.5)
WBC: 13.2 10*3/uL — AB (ref 4.0–10.5)

## 2016-06-05 LAB — TYPE AND SCREEN
ABO/RH(D): O POS
ANTIBODY SCREEN: NEGATIVE

## 2016-06-05 LAB — OB RESULTS CONSOLE GBS: STREP GROUP B AG: NEGATIVE

## 2016-06-05 MED ORDER — PHENYLEPHRINE 40 MCG/ML (10ML) SYRINGE FOR IV PUSH (FOR BLOOD PRESSURE SUPPORT): 80.0000 ug | PREFILLED_SYRINGE | INTRAVENOUS | Status: DC | PRN

## 2016-06-05 MED ORDER — OXYTOCIN 40 UNITS IN LACTATED RINGERS INFUSION - SIMPLE MED
INTRAVENOUS | Status: AC
  Administered 2016-06-05: 2 m[IU]/min via INTRAVENOUS
  Filled 2016-06-05: qty 1000

## 2016-06-05 MED ORDER — OXYTOCIN BOLUS FROM INFUSION
500.0000 mL | Freq: Once | INTRAVENOUS | Status: AC
  Administered 2016-06-06: 500 mL via INTRAVENOUS

## 2016-06-05 MED ORDER — OXYTOCIN 40 UNITS IN LACTATED RINGERS INFUSION - SIMPLE MED
2.5000 [IU]/h | INTRAVENOUS | Status: DC
  Administered 2016-06-06: 2.5 [IU]/h via INTRAVENOUS

## 2016-06-05 MED ORDER — LIDOCAINE HCL (PF) 1 % IJ SOLN
30.0000 mL | INTRAMUSCULAR | Status: DC | PRN
  Filled 2016-06-05: qty 30

## 2016-06-05 MED ORDER — ACETAMINOPHEN 325 MG PO TABS
650.0000 mg | ORAL_TABLET | ORAL | Status: DC | PRN
  Administered 2016-06-06: 650 mg via ORAL
  Filled 2016-06-05: qty 2

## 2016-06-05 MED ORDER — DIPHENHYDRAMINE HCL 50 MG/ML IJ SOLN: 12.5000 mg | INTRAMUSCULAR | Status: DC | PRN

## 2016-06-05 MED ORDER — LACTATED RINGERS IV SOLN: 500.0000 mL | INTRAVENOUS | Status: DC | PRN

## 2016-06-05 MED ORDER — FENTANYL 2.5 MCG/ML BUPIVACAINE 1/10 % EPIDURAL INFUSION (WH - ANES)
14.0000 mL/h | INTRAMUSCULAR | Status: DC | PRN
  Administered 2016-06-05: 12 mL/h via EPIDURAL
  Administered 2016-06-06: 14 mL/h via EPIDURAL
  Filled 2016-06-05 (×2): qty 100

## 2016-06-05 MED ORDER — OXYTOCIN 40 UNITS IN LACTATED RINGERS INFUSION - SIMPLE MED
1.0000 m[IU]/min | INTRAVENOUS | Status: DC
  Administered 2016-06-05: 2 m[IU]/min via INTRAVENOUS

## 2016-06-05 MED ORDER — OXYCODONE-ACETAMINOPHEN 5-325 MG PO TABS: 2.0000 | ORAL_TABLET | ORAL | Status: DC | PRN

## 2016-06-05 MED ORDER — SOD CITRATE-CITRIC ACID 500-334 MG/5ML PO SOLN: 30.0000 mL | ORAL | Status: DC | PRN

## 2016-06-05 MED ORDER — LACTATED RINGERS IV SOLN: 500.0000 mL | Freq: Once | INTRAVENOUS | Status: DC

## 2016-06-05 MED ORDER — PHENYLEPHRINE 40 MCG/ML (10ML) SYRINGE FOR IV PUSH (FOR BLOOD PRESSURE SUPPORT)
80.0000 ug | PREFILLED_SYRINGE | INTRAVENOUS | Status: DC | PRN
  Administered 2016-06-05 (×2): 80 ug via INTRAVENOUS

## 2016-06-05 MED ORDER — EPHEDRINE 5 MG/ML INJ: 10.0000 mg | INTRAVENOUS | Status: DC | PRN

## 2016-06-05 MED ORDER — ONDANSETRON HCL 4 MG/2ML IJ SOLN
4.0000 mg | Freq: Four times a day (QID) | INTRAMUSCULAR | Status: DC | PRN
  Administered 2016-06-05 – 2016-06-06 (×2): 4 mg via INTRAVENOUS
  Filled 2016-06-05 (×3): qty 2

## 2016-06-05 MED ORDER — TERBUTALINE SULFATE 1 MG/ML IJ SOLN: 0.2500 mg | Freq: Once | INTRAMUSCULAR | Status: DC | PRN

## 2016-06-05 MED ORDER — LACTATED RINGERS IV SOLN
INTRAVENOUS | Status: DC
  Administered 2016-06-05 – 2016-06-06 (×2): via INTRAVENOUS

## 2016-06-05 MED ORDER — LIDOCAINE HCL (PF) 1 % IJ SOLN
INTRAMUSCULAR | Status: DC | PRN
  Administered 2016-06-05: 2 mL
  Administered 2016-06-05: 5 mL
  Administered 2016-06-05: 3 mL

## 2016-06-05 MED ORDER — FENTANYL CITRATE (PF) 100 MCG/2ML IJ SOLN: 100.0000 ug | INTRAMUSCULAR | Status: DC | PRN

## 2016-06-05 MED ORDER — PHENYLEPHRINE 40 MCG/ML (10ML) SYRINGE FOR IV PUSH (FOR BLOOD PRESSURE SUPPORT)
80.0000 ug | PREFILLED_SYRINGE | INTRAVENOUS | Status: DC | PRN
  Filled 2016-06-05: qty 10

## 2016-06-05 MED ORDER — LACTATED RINGERS IV SOLN
500.0000 mL | Freq: Once | INTRAVENOUS | Status: AC
  Administered 2016-06-05: 500 mL via INTRAVENOUS

## 2016-06-05 MED ORDER — BUPIVACAINE HCL (PF) 0.25 % IJ SOLN
INTRAMUSCULAR | Status: DC | PRN
  Administered 2016-06-05 – 2016-06-06 (×4): 5 mL via EPIDURAL

## 2016-06-05 MED ORDER — OXYCODONE-ACETAMINOPHEN 5-325 MG PO TABS: 1.0000 | ORAL_TABLET | ORAL | Status: DC | PRN

## 2016-06-05 NOTE — Anesthesia Pain Management Evaluation Note (Signed)
  CRNA Pain Management Visit Note  Patient: Kimberly Morgan, 36 y.o., female  "Hello I am a member of the anesthesia team at [facility]. We have an anesthesia team available at all times to provide care throughout the hospital, including epidural management and anesthesia for C-section. I don't know your plan for the delivery whether it a natural birth, water birth, IV sedation, nitrous supplementation, doula or epidural, but we want to meet your pain goals."   1.Was your pain managed to your expectations on prior hospitalizations?   Yes   2.What is your expectation for pain management during this hospitalization?     IV pain meds  3.How can we help you reach that goal? Epidural prn  Record the patient's initial score and the patient's pain goal.   Pain: 0  Pain Goal: 7 [facility] wants you to be able to say your pain was always managed very well.  [facility] Surgery CenterWRINKLE,Su Duma 06/05/2016

## 2016-06-05 NOTE — Progress Notes (Signed)
Patient ID: Kimberly Morgan, female   DOB: 07/11/1980, 36 y.o.   MRN: [id number] Doing well, UCs less painful  Vitals:   06/05/16 1705 06/05/16 1802 06/05/16 1903 06/05/16 1947  BP: (!) 147/96 (!) 143/79 (!) 142/93 (!) 129/59  Pulse: 95 (!) 105 (!) 101 95  Resp: 18 18 18    Weight:      Height:        FHR reactive UCs irreg  Dilation: 5 Effacement (%): 80 Station: -2 Presentation: Vertex Exam by:: Artelia LarocheM. Sholanda Croson, CNM   AROM clear fluid  UCs more painful now  Plan SVD soon

## 2016-06-05 NOTE — H&P (Signed)
Kimberly Morgan is a 36 y.o. female presenting for Induction of labor for postdates   Cervix is reportedly 5cm  Patient Active Problem List   Diagnosis Date Noted  . History of cervical incompetence in pregnancy, currently pregnant 11/25/2015  . Supervision of high-risk pregnancy, third trimester 10/29/2015  . History of cerclage, currently pregnant 10/29/2015  . Vaginal delivery 05/23/2013  . PROM (premature rupture of membranes) 05/21/2013  . Renal calculus or stone 05/15/2013  . Pregnancy resulting from in vitro fertilization 11/22/2012  . Hypothyroid 10/31/2012  . History of cervical incompetence in pregnancy, currently pregnant, third trimester 09/30/2011    Class: Diagnosis of   . 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 History of cerclage, currently pregnant 10/29/2015  . Hypothyroid 10/31/2012  . Hypothyroidism   . 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 Kidney stones   . PONV (postoperative nausea and vomiting)   . Preterm labor   . Seasonal allergies   . Supervision of other high-risk pregnancy 10/29/2015    [facility] Initiated Care at     9+3 weeks FOB  Rudell Cobb Dating By  [place] Pap  05/28/15 negative GC/CT Initial:                36+wks: Genetic Screen NT/IT:  CF screen  Anatomic [place]  Flu vaccine  Tdap Recommended ~ 28wks Glucose Screen  2 hr GBS  Feed Preference  Contraception  Circumcision  Childbirth Classes  Pediatrician    . UTI (lower urinary tract infection)    Past Surgical History:  Procedure Laterality Date  . CERVICAL CERCLAGE  09/30/2011   Procedure: CERCLAGE CERVICAL;  Surgeon: [name], MD;  Location: [facility];  Service: Gynecology;  Laterality: N/A;  . CERVICAL CERCLAGE N/A 12/01/2012   Procedure: [facility] CERCLAGE CERVICAL;  Surgeon: [name], MD;  Location: [facility];  Service: Gynecology;  Laterality: N/A;  . CERVICAL CERCLAGE N/A 11/25/2015   Procedure: CERCLAGE CERVICAL--[place] cerclage;  Surgeon: [name], MD;  Location: [facility];  Service: Gynecology;  Laterality: N/A;  . CHOLECYSTECTOMY     laparoscopic- 2011   Family History: family history includes COPD in her mother; Cancer in her maternal grandfather; Diabetes in her paternal grandfather; Hypertension in her sister. Social History:  reports that she quit smoking about 4 years ago. Her smoking use included Cigarettes. She has a 2.50 pack-year smoking history. She has never used smokeless tobacco. She reports that she does not drink alcohol or use drugs.     Maternal Diabetes: No Genetic Screening: Normal Maternal Ultrasounds/Referrals: Normal Fetal Ultrasounds or other Referrals:  None Maternal Substance Abuse:  No Significant Maternal Medications:  None Significant Maternal Lab Results:  None GBS Neg Other Comments:  None  Review of Systems  Constitutional: Negative for chills, fever and malaise/fatigue.  Respiratory: Negative for shortness of breath.   Gastrointestinal: Negative for abdominal pain, constipation, diarrhea, nausea and vomiting.  Genitourinary: Negative for dysuria.  Musculoskeletal: Negative for back pain.  Neurological: Negative for dizziness.   Maternal Medical History:  Reason for admission: Nausea. IOL for post dates  Contractions: Frequency: rare.   Perceived severity is mild.    Fetal activity: Perceived fetal activity is normal.   Last perceived fetal movement was within the past hour.    Prenatal complications: No bleeding, HIV, PIH, pre-eclampsia or substance abuse.   Prenatal Complications - Diabetes: none.      Last menstrual period 05/09/2014, currently breastfeeding. Maternal Exam:  Uterine Assessment: Contraction strength is mild.  Contraction  frequency is rare.   Abdomen: Patient reports no abdominal tenderness. Fundal height is 38.   Estimated fetal weight is 7.5.   Fetal presentation: vertex  Introitus: Normal vulva. Normal vagina.  Ferning test: not done.  Nitrazine test: not done. Amniotic fluid character: not assessed.  Pelvis: adequate for delivery.      Fetal Exam Fetal Monitor Review: Mode: ultrasound.   Baseline rate: 135.  Variability: moderate (6-25 bpm).   Pattern: accelerations present and no decelerations.    Fetal State Assessment: Category I - tracings are normal.     Physical Exam  Constitutional: She is oriented to person, place, and time. She appears well-developed and well-nourished. No distress.  Cardiovascular: Normal rate and regular rhythm.  Exam reveals no gallop and no friction rub.   No murmur heard. Respiratory: Effort normal. No respiratory distress. She has no wheezes. She has no rales.  GI: Soft. She exhibits no distension. There is no tenderness. There is no rebound and no guarding.  Musculoskeletal: Normal range of motion.  Neurological: She is alert and oriented to person, place, and time.  Skin: Skin is warm and dry.  Psychiatric: She has a normal mood and affect.    Prenatal labs: ABO, Rh: --/--/O POS (06/29 0930) Antibody: Negative (10/11 0838) Rubella: 5.42 (06/06 1534) RPR: Non Reactive (10/11 0838)  HBsAg: Negative (06/06 1534)  HIV: Non Reactive (10/11 0838)  GBS: Negative (12/15 1200)   Assessment/Plan: SIUP at 505w6d Post dates pregnancy  Admit to birthing suites Will start Pitocin due to advanced dilation   Wynelle BourgeoisMarie Morgan 06/05/2016, 11:02 AM

## 2016-06-05 NOTE — Anesthesia Procedure Notes (Signed)
Epidural Patient location during procedure: OB Start time: 06/05/2016 8:22 PM End time: 06/05/2016 8:32 PM  Staffing Anesthesiologist: Marcene DuosFITZGERALD, Salih Williamson Performed: anesthesiologist   Preanesthetic Checklist Completed: patient identified, site marked, surgical consent, pre-op evaluation, timeout performed, IV checked, risks and benefits discussed and monitors and equipment checked  Epidural Patient position: sitting Prep: site prepped and draped and DuraPrep Patient monitoring: continuous pulse ox and blood pressure Approach: midline Location: L4-L5 Injection technique: LOR air  Needle:  Needle type: Tuohy  Needle gauge: 17 G Needle length: 9 cm and 9 Needle insertion depth: 8 cm Catheter type: closed end flexible Catheter size: 19 Gauge Catheter at skin depth: 16 cm Test dose: negative  Assessment Events: blood not aspirated, injection not painful, no injection resistance, negative IV test and no paresthesia

## 2016-06-05 NOTE — Progress Notes (Signed)
Patient ID: Kimberly Morgan, female   DOB: Jan 18, 1981, 36 y.o.   MRN: [id number]  Very comfortable w/ epidural; just getting into R exaggerated Sim's; Pit had been cut down to 338mu/min earlier when she was trying to get comfortable w/ the epidural  BP 111/63, other VSS Ctx now spaced out/irreg 5-7 mins Cx 6+/90/-1, -2 at last exam  IUP@term  Active labor  Will begin to increase Pit to achieve reg ctx  Cam HaiSHAW, Renika Shiflet CNM 06/05/2016 11:03 PM

## 2016-06-05 NOTE — Anesthesia Preprocedure Evaluation (Signed)
Anesthesia Evaluation  Patient identified by MRN, date of birth, ID band Patient awake    Reviewed: Allergy & Precautions, Patient's Chart, lab work & pertinent test results  Airway Mallampati: II  TM Distance: >3 FB     Dental   Pulmonary former smoker,    Pulmonary exam normal        Cardiovascular negative cardio ROS Normal cardiovascular exam     Neuro/Psych negative neurological ROS     GI/Hepatic negative GI ROS, Neg liver ROS,   Endo/Other  Hypothyroidism Morbid obesity  Renal/GU negative Renal ROS     Musculoskeletal   Abdominal   Peds  Hematology negative hematology ROS (+)   Anesthesia Other Findings   Reproductive/Obstetrics (+) Pregnancy                             Lab Results  Component Value Date   WBC 13.2 (H) 06/05/2016   HGB 13.0 06/05/2016   HCT 37.7 06/05/2016   MCV 89.3 06/05/2016   PLT 231 06/05/2016    Anesthesia Physical Anesthesia Plan  ASA: III  Anesthesia Plan: Epidural   Post-op Pain Management:    Induction:   Airway Management Planned: Natural Airway  Additional Equipment:   Intra-op Plan:   Post-operative Plan:   Informed Consent: I have reviewed the patients History and Physical, chart, labs and discussed the procedure including the risks, benefits and alternatives for the proposed anesthesia with the patient or authorized representative who has indicated his/her understanding and acceptance.     Plan Discussed with:   Anesthesia Plan Comments:         Anesthesia Quick Evaluation

## 2016-06-06 ENCOUNTER — Encounter (HOSPITAL_COMMUNITY): Payer: Self-pay

## 2016-06-06 LAB — HIV ANTIBODY (ROUTINE TESTING W REFLEX): HIV Screen 4th Generation wRfx: NONREACTIVE

## 2016-06-06 LAB — RPR: RPR Ser Ql: NONREACTIVE

## 2016-06-06 MED ORDER — ONDANSETRON HCL 4 MG/2ML IJ SOLN: 4.0000 mg | INTRAMUSCULAR | Status: DC | PRN

## 2016-06-06 MED ORDER — ACETAMINOPHEN 325 MG PO TABS
650.0000 mg | ORAL_TABLET | ORAL | Status: DC | PRN
  Administered 2016-06-06 – 2016-06-07 (×3): 650 mg via ORAL
  Filled 2016-06-06 (×5): qty 2

## 2016-06-06 MED ORDER — IBUPROFEN 600 MG PO TABS: 600.0000 mg | ORAL_TABLET | Freq: Four times a day (QID) | ORAL | Status: DC

## 2016-06-06 MED ORDER — WITCH HAZEL-GLYCERIN EX PADS
1.0000 "application " | MEDICATED_PAD | CUTANEOUS | Status: DC | PRN
  Administered 2016-06-07: 1 via TOPICAL

## 2016-06-06 MED ORDER — TETANUS-DIPHTH-ACELL PERTUSSIS 5-2.5-18.5 LF-MCG/0.5 IM SUSP: 0.5000 mL | Freq: Once | INTRAMUSCULAR | Status: DC

## 2016-06-06 MED ORDER — PRENATAL MULTIVITAMIN CH
1.0000 | ORAL_TABLET | Freq: Every day | ORAL | Status: DC
  Administered 2016-06-06 – 2016-06-07 (×2): 1 via ORAL
  Filled 2016-06-06 (×2): qty 1

## 2016-06-06 MED ORDER — ZOLPIDEM TARTRATE 5 MG PO TABS: 5.0000 mg | ORAL_TABLET | Freq: Every evening | ORAL | Status: DC | PRN

## 2016-06-06 MED ORDER — ONDANSETRON HCL 4 MG PO TABS: 4.0000 mg | ORAL_TABLET | ORAL | Status: DC | PRN

## 2016-06-06 MED ORDER — DIPHENHYDRAMINE HCL 25 MG PO CAPS: 25.0000 mg | ORAL_CAPSULE | Freq: Four times a day (QID) | ORAL | Status: DC | PRN

## 2016-06-06 MED ORDER — SIMETHICONE 80 MG PO CHEW: 80.0000 mg | CHEWABLE_TABLET | ORAL | Status: DC | PRN

## 2016-06-06 MED ORDER — OXYCODONE HCL 5 MG PO TABS: 5.0000 mg | ORAL_TABLET | ORAL | Status: DC | PRN

## 2016-06-06 MED ORDER — BENZOCAINE-MENTHOL 20-0.5 % EX AERO
1.0000 "application " | INHALATION_SPRAY | CUTANEOUS | Status: DC | PRN
  Administered 2016-06-06: 1 via TOPICAL
  Filled 2016-06-06: qty 56

## 2016-06-06 MED ORDER — LEVOTHYROXINE SODIUM 125 MCG PO TABS
125.0000 ug | ORAL_TABLET | Freq: Every day | ORAL | Status: DC
  Administered 2016-06-06 – 2016-06-07 (×2): 125 ug via ORAL
  Filled 2016-06-06 (×3): qty 1

## 2016-06-06 MED ORDER — LEVOTHYROXINE SODIUM 125 MCG PO TABS: 125.0000 ug | ORAL_TABLET | Freq: Every day | ORAL | Status: DC

## 2016-06-06 MED ORDER — COCONUT OIL OIL
1.0000 "application " | TOPICAL_OIL | Status: DC | PRN
  Administered 2016-06-07: 1 via TOPICAL
  Filled 2016-06-06: qty 120

## 2016-06-06 MED ORDER — DIBUCAINE 1 % RE OINT: 1.0000 "application " | TOPICAL_OINTMENT | RECTAL | Status: DC | PRN

## 2016-06-06 MED ORDER — SENNOSIDES-DOCUSATE SODIUM 8.6-50 MG PO TABS
2.0000 | ORAL_TABLET | ORAL | Status: DC
  Administered 2016-06-06: 2 via ORAL
  Filled 2016-06-06: qty 2

## 2016-06-06 NOTE — Lactation Note (Signed)
This note was copied from a baby's chart. Lactation Consultation Note  Patient Name: Kimberly Morgan Today's Date: 06/06/2016 Reason for consult: Initial assessment Baby at 9 hr of life. Experienced bf mom denies breast or nipple pain. She reports baby is bf well. Baby has a nice gape and flanged lips. With visual assessment tongue lifts to roof and extends over gum ridge. Discussed baby behavior, feeding frequency, baby belly size, voids, wt loss, breast changes, and nipple care. Demonstrated manual expression, large drops of clear to white to yellow colostrum noted bilaterally, spoon in room. Given lactation handouts. Aware of OP services and support group .Parents are worried about the baby spitting up. Dad said the first one was white/clear, the 2nd was bright yellow, and 3rd one was neon green (lactation saw the 3rd one). RN is aware of the color change. Parents will call for bf help as needed.      Maternal Data Has patient been taught Hand Expression?: Yes Does the patient have breastfeeding experience prior to this delivery?: Yes  Feeding Feeding Type: Breast Fed Length of feed: 10 min  LATCH Score/Interventions Latch: Grasps breast easily, tongue down, lips flanged, rhythmical sucking.  Audible Swallowing: Spontaneous and intermittent  Type of Nipple: Everted at rest and after stimulation  Comfort (Breast/Nipple): Soft / non-tender     Hold (Positioning): Assistance needed to correctly position infant at breast and maintain latch. Intervention(s): Position options;Support Pillows  LATCH Score: 9  Lactation Tools Discussed/Used WIC Program: No   Consult Status Consult Status: Follow-up Date: 06/07/16 Follow-up type: In-patient    Rulon Eisenmengerlizabeth E Winter Trefz 06/06/2016, 4:47 PM

## 2016-06-06 NOTE — Anesthesia Postprocedure Evaluation (Signed)
Anesthesia Post Note  Patient: Kimberly Morgan  Procedure(s) Performed: * No procedures listed *  Patient location during evaluation: Mother Baby Anesthesia Type: Epidural Level of consciousness: awake Pain management: satisfactory to patient Vital Signs Assessment: post-procedure vital signs reviewed and stable Respiratory status: spontaneous breathing Cardiovascular status: stable Anesthetic complications: no        Last Vitals:  Vitals:   06/06/16 1030 06/06/16 1422  BP: 130/71 129/70  Pulse: 93 89  Resp:    Temp: 36.7 C 36.8 C    Last Pain:  Vitals:   06/06/16 1203  TempSrc:   PainSc: 3    Pain Goal:                 KeyCorpBURGER,Monty Mccarrell

## 2016-06-06 NOTE — Progress Notes (Signed)
Kimberly Morgan is a 36 y.o. G3P1101 at 3982w0d admitted for induction of labor due to Post dates.   Subjective: Patient feeling lots of pain in her back, pressure in her bottom, and increased nausea/vomiting.   Objective: BP 114/89   Pulse (!) 130   Temp 99.1 F (37.3 C) (Axillary)   Resp 20   Ht 5\' 4"  (1.626 m)   Wt 105.7 kg (233 lb)   LMP 05/09/2014   SpO2 98%   BMI 39.99 kg/m  No intake/output data recorded. No intake/output data recorded.  FHT:  FHR: 150 bpm, variability: minimal ,  accelerations:  Present,  decelerations:  Present early UC:   regular, every 2-4 minutes SVE:   Dilation: Lip/rim Effacement (%): 90 Station: 0 Exam by:: Genelle BalE. Johnson, RN  Labs: Lab Results  Component Value Date   WBC 13.2 (H) 06/05/2016   HGB 13.0 06/05/2016   HCT 37.7 06/05/2016   MCV 89.3 06/05/2016   PLT 231 06/05/2016    Assessment / Plan: Induction of labor due to postterm,  progressing well on pitocin  Labor: Still on pitocin, has not made much change since last check. Still has a rim of cervix anteriorly and station is 0. Will continue to increase pitocin at this time and check again in 1-2 hours.  Fetal Wellbeing:  Category I Pain Control:  Epidural I/D:  GBS neg Anticipated MOD:  NSVD  Kimberly DinningChristina M Morgan 06/06/2016, 3:31 AM

## 2016-06-07 MED ORDER — ACETAMINOPHEN 325 MG PO TABS: 650.0000 mg | ORAL_TABLET | ORAL | 0 refills | Status: DC | PRN

## 2016-06-07 NOTE — Lactation Note (Signed)
This note was copied from a baby's chart. Lactation Consultation Note  Patient Name: Kimberly Morgan Today's Date: 06/07/2016  Per RN, Mom reports she does not need to see LC before d/c today.   Maternal Data    Feeding Length of feed: 45 min  LATCH Score/Interventions                      Lactation Tools Discussed/Used     Consult Status      Alfred LevinsGranger, Skyelar Halliday Ann 06/07/2016, 12:42 PM

## 2016-06-07 NOTE — Discharge Summary (Signed)
OB Discharge Summary     Patient Name: Kimberly Morgan DOB: Sep 13, 1980 MRN: [id number]  Date of admission: 06/05/2016 Delivering MD: Cam Hai D   Date of discharge: 06/07/2016  Admitting diagnosis: INDUCTION Intrauterine pregnancy: [redacted]w[redacted]d     Secondary diagnosis:  Active Problems:   Post-dates pregnancy  Additional problems:  Hx of cervical incompetence Hx of hypothyroidism     Discharge diagnosis: Term Pregnancy Delivered                                                                                                Post partum procedures:none  Augmentation: Pitocin  Complications: None  Hospital course:  Induction of Labor With Vaginal Delivery   36 y.o. yo [id number] at [redacted]w[redacted]d was admitted to the hospital 06/05/2016 for induction of labor.  Indication for induction: Postdates.  Patient had an uncomplicated labor course as follows: Membrane Rupture Time/Date: 7:50 PM ,06/05/2016   Intrapartum Procedures: Episiotomy: None [1]                                         Lacerations:  None [1]  Patient had delivery of a Viable infant.  Information for the patient's newborn:  Shequilla, Sallie [[id number]]  Delivery Method: Vag-Spont   06/06/2016  Details of delivery can be found in separate delivery note.  Patient had a routine postpartum course. Patient is discharged home 06/07/16.   Physical exam  Vitals:   06/06/16 1030 06/06/16 1422 06/06/16 2030 06/07/16 0620  BP: 130/71 129/70 127/72 131/86  Pulse: 93 89 79 87  Resp:   18 20  Temp: 98 F (36.7 C) 98.2 F (36.8 C) 98.4 F (36.9 C) 98.3 F (36.8 C)  TempSrc:   Oral Oral  SpO2:   98%   Weight:      Height:       General: alert, cooperative and no distress Lochia: appropriate Uterine Fundus: firm Incision: N/A DVT Evaluation: No evidence of DVT seen on physical exam. Negative Homan's sign. No cords or calf tenderness. Labs: Lab Results  Component Value Date   WBC 13.2 (H) 06/05/2016   HGB 13.0  06/05/2016   HCT 37.7 06/05/2016   MCV 89.3 06/05/2016   PLT 231 06/05/2016   CMP Latest Ref Rng & Units 11/21/2015  Glucose 65 - 99 mg/dL 83  BUN 6 - 20 mg/dL 7  Creatinine 6.96 - 2.95 mg/dL 2.84  Sodium 132 - 440 mmol/L 136  Potassium 3.5 - 5.1 mmol/L 4.0  Chloride 101 - 111 mmol/L 106  CO2 22 - 32 mmol/L 25  Calcium 8.9 - 10.3 mg/dL 1.0(U)  Total Protein 6.0 - 8.3 g/dL -  Total Bilirubin 0.3 - 1.2 mg/dL -  Alkaline Phos 39 - 725 U/L -  AST 0 - 37 U/L -  ALT 0 - 35 U/L -    Discharge instruction: per After Visit Summary and "Baby and Me Booklet".  After visit meds:  Allergies as of 06/07/2016      Reactions  Aspirin Anaphylaxis   Bactrim [sulfamethoxazole-trimethoprim] Nausea And Vomiting   Baker's Yeast [yeast] Hives, Other (See Comments)   Reaction: extreme all over body yeast infections.   Latex Rash      Medication List    TAKE these medications   acetaminophen 325 MG tablet Commonly known as:  TYLENOL Take 2 tablets (650 mg total) by mouth every 4 (four) hours as needed (for pain scale < 4).   levothyroxine 125 MCG tablet Commonly known as:  SYNTHROID, LEVOTHROID TAKE 1 TABLET BY MOUTH EVERY DAY BEFORE BREAKFAST   multivitamin-prenatal 27-0.8 MG Tabs tablet Take 1 tablet by mouth daily at 12 noon.       Diet: routine diet  Activity: Advance as tolerated. Pelvic rest for 6 weeks.   Outpatient follow up:6 weeks Follow up Appt: Future Appointments Date Time Provider Department Center  07/08/2016 10:00 AM [name], MD [facility]   Follow up Visit:No Follow-up on file.  Postpartum contraception: None  Newborn Data: Live born female  Birth Weight: 8 lb 3.6 oz (3731 g) APGAR: 8, 9  Baby Feeding: Breast Disposition:home with mother vs rooming in    06/07/2016 [name], MD  OB FELLOW DISCHARGE ATTESTATION  I have seen and examined this patient and agree with above documentation in the resident's note.   Ernestina Penna, MD 8:54 AM

## 2016-06-07 NOTE — Discharge Instructions (Signed)

## 2016-06-21 ENCOUNTER — Other Ambulatory Visit: Payer: Self-pay | Admitting: Obstetrics & Gynecology

## 2016-07-08 ENCOUNTER — Ambulatory Visit (INDEPENDENT_AMBULATORY_CARE_PROVIDER_SITE_OTHER): Payer: BC Managed Care – PPO | Admitting: Obstetrics and Gynecology

## 2016-07-08 NOTE — Progress Notes (Signed)
   Subjective:  Kimberly Morgan is a 36 y.o. female who presents for her 4 weeks postpartum visit  Patient has no acute concerns at this time.   Pt has no plans to use birth control . Hx IVF pregnancy x 2  The following portions of the patient's history were reviewed and updated as appropriate: allergies, current medications, past family history, past medical history, past surgical history and problem list.  Review of Systems    See Subjective, otherwise negative ROS.  Objective:  BP 128/80 (BP Location: Right Arm, Patient Position: Sitting, Cuff Size: Normal)   Pulse 81   Wt 209 lb 6.4 oz (95 kg)   LMP 05/09/2014   BMI 35.94 kg/m   General:  alert, cooperative and no distress     Lungs: clear to auscultation bilaterally  Heart:  regular rate and rhythm, S1, S2 normal, no murmur  Abdomen: soft, non-tender; bowel sounds normal; no masses,  no organomegaly   Vulva:  normal  Vagina: normal vagina  Cervix:  normal  Corpus:  low hanging uterus, anterior, normal contour, position, consistency, mobility, non-tender  Adnexa:  normal adnexa        Assessment:  1.  postpartum exam.  2. Contraception: None  3.  Follow up annually  4 no depression Plan:  Routine Gyn care  By signing my name below, I, Freida Busmaniana Omoyeni, attest that this documentation has been prepared under the direction and in the presence of Tilda BurrowJohn V Pierson Vantol, MD . Electronically Signed: Freida Busmaniana Omoyeni, Scribe. 07/08/2016. 12:08 PM. I personally performed the services described in this documentation, which was SCRIBED in my presence. The recorded information has been reviewed and considered accurate. It has been edited as necessary during review. Tilda BurrowFERGUSON,Ricahrd Schwager V, MD

## 2016-11-16 ENCOUNTER — Ambulatory Visit: Payer: BC Managed Care – PPO | Admitting: Physician Assistant

## 2016-11-18 ENCOUNTER — Ambulatory Visit: Payer: BC Managed Care – PPO | Admitting: Physician Assistant

## 2016-11-26 ENCOUNTER — Encounter: Payer: Self-pay | Admitting: Physician Assistant

## 2016-11-26 ENCOUNTER — Ambulatory Visit (INDEPENDENT_AMBULATORY_CARE_PROVIDER_SITE_OTHER): Payer: BC Managed Care – PPO | Admitting: Physician Assistant

## 2016-11-26 VITALS — BP 124/80 | HR 89 | Temp 97.9°F | Resp 18 | Ht 62.25 in | Wt 214.4 lb

## 2016-11-26 DIAGNOSIS — Z0000 Encounter for general adult medical examination without abnormal findings: Secondary | ICD-10-CM | POA: Diagnosis not present

## 2016-11-26 DIAGNOSIS — E039 Hypothyroidism, unspecified: Secondary | ICD-10-CM

## 2016-11-26 LAB — CBC WITH DIFFERENTIAL/PLATELET
BASOS PCT: 0 %
Basophils Absolute: 0 cells/uL (ref 0–200)
Eosinophils Absolute: 212 cells/uL (ref 15–500)
Eosinophils Relative: 2 %
HCT: 42.6 % (ref 35.0–45.0)
Hemoglobin: 14.2 g/dL (ref 12.0–15.0)
LYMPHS PCT: 29 %
Lymphs Abs: 3074 cells/uL (ref 850–3900)
MCH: 29.8 pg (ref 27.0–33.0)
MCHC: 33.3 g/dL (ref 32.0–36.0)
MCV: 89.5 fL (ref 80.0–100.0)
MONO ABS: 530 {cells}/uL (ref 200–950)
MONOS PCT: 5 %
MPV: 10.3 fL (ref 7.5–12.5)
NEUTROS ABS: 6784 {cells}/uL (ref 1500–7800)
Neutrophils Relative %: 64 %
PLATELETS: 241 10*3/uL (ref 140–400)
RBC: 4.76 MIL/uL (ref 3.80–5.10)
RDW: 13.6 % (ref 11.0–15.0)
WBC: 10.6 10*3/uL (ref 3.8–10.8)

## 2016-11-26 LAB — COMPLETE METABOLIC PANEL WITH GFR
ALT: 22 U/L (ref 6–29)
AST: 15 U/L (ref 10–30)
Albumin: 4.5 g/dL (ref 3.6–5.1)
Alkaline Phosphatase: 103 U/L (ref 33–115)
BUN: 13 mg/dL (ref 7–25)
CHLORIDE: 107 mmol/L (ref 98–110)
CO2: 23 mmol/L (ref 20–31)
CREATININE: 0.61 mg/dL (ref 0.50–1.10)
Calcium: 9.5 mg/dL (ref 8.6–10.2)
GFR, Est African American: >89 mL/min (ref >=60)
GFR, Est Non African American: >89 mL/min (ref >=60)
GLUCOSE: 88 mg/dL (ref 70–99)
Potassium: 4.3 mmol/L (ref 3.5–5.3)
Sodium: 142 mmol/L (ref 135–146)
TOTAL PROTEIN: 7.2 g/dL (ref 6.1–8.1)
Total Bilirubin: 0.5 mg/dL (ref 0.2–1.2)

## 2016-11-26 LAB — LIPID PANEL
Cholesterol: 159 mg/dL (ref <200)
HDL: 44 mg/dL — ABNORMAL LOW (ref >50)
LDL CALC: 92 mg/dL (ref <100)
Total CHOL/HDL Ratio: 3.6 Ratio (ref <5.0)
Triglycerides: 117 mg/dL (ref <150)
VLDL: 23 mg/dL (ref <30)

## 2016-11-26 LAB — TSH: TSH: 1.53 m[IU]/L
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
## 2016-11-30 ENCOUNTER — Telehealth: Payer: Self-pay | Admitting: *Deleted

## 2016-11-30 NOTE — Telephone Encounter (Signed)
Called patient to follow-up with call to after hours nurse. Unable to leave message d/t voicemail full.

## 2016-12-01 ENCOUNTER — Other Ambulatory Visit: Payer: Self-pay

## 2016-12-01 MED ORDER — LEVOTHYROXINE SODIUM 125 MCG PO TABS: ORAL_TABLET | ORAL | 3 refills | Status: DC

## 2016-12-07 ENCOUNTER — Telehealth: Payer: Self-pay | Admitting: *Deleted

## 2016-12-07 ENCOUNTER — Other Ambulatory Visit: Payer: Self-pay | Admitting: Obstetrics and Gynecology

## 2016-12-07 DIAGNOSIS — B3731 Acute candidiasis of vulva and vagina: Secondary | ICD-10-CM | POA: Insufficient documentation

## 2016-12-07 DIAGNOSIS — B373 Candidiasis of vulva and vagina: Secondary | ICD-10-CM

## 2016-12-07 MED ORDER — FLUCONAZOLE 150 MG PO TABS: 150.0000 mg | ORAL_TABLET | Freq: Once | ORAL | 1 refills | Status: AC

## 2016-12-07 NOTE — Progress Notes (Signed)
Rx  Diflucan [place]

## 2016-12-07 NOTE — Telephone Encounter (Signed)
Rx for diflucan will be escribed to pharmacy of record. Please let pt know that Rx [place]

## 2016-12-07 NOTE — Telephone Encounter (Signed)
Patient called with complaints of a yeast infection; itching, discharge. She was prescribed an antibiotic for mastitis recently. [place] prescription for Diflucan. Please advise.

## 2016-12-08 NOTE — Telephone Encounter (Signed)
Pt aware Diflucan sent.

## 2017-01-04 ENCOUNTER — Telehealth: Payer: Self-pay

## 2017-01-04 NOTE — Telephone Encounter (Signed)
Patient called and asked if medication could be called in for a yeast infection. I explained to pt she would need to be seen in office or she could try otc rx. Pt states she will try otc med and if that does not work then she will call to sch an appt.

## 2017-01-12 ENCOUNTER — Ambulatory Visit (INDEPENDENT_AMBULATORY_CARE_PROVIDER_SITE_OTHER): Payer: BC Managed Care – PPO | Admitting: Women's Health

## 2017-01-12 ENCOUNTER — Encounter: Payer: Self-pay | Admitting: Women's Health

## 2017-01-12 VITALS — BP 100/70 | HR 78 | Temp 98.2°F | Wt 202.0 lb

## 2017-01-12 DIAGNOSIS — M549 Dorsalgia, unspecified: Secondary | ICD-10-CM | POA: Diagnosis not present

## 2017-01-12 DIAGNOSIS — R350 Frequency of micturition: Secondary | ICD-10-CM

## 2017-01-12 DIAGNOSIS — N898 Other specified noninflammatory disorders of vagina: Secondary | ICD-10-CM

## 2017-01-12 LAB — POCT URINALYSIS DIPSTICK
GLUCOSE UA: NEGATIVE
KETONES UA: NEGATIVE
Leukocytes, UA: NEGATIVE
Nitrite, UA: NEGATIVE
RBC UA: NEGATIVE

## 2017-01-12 LAB — POCT WET PREP (WET MOUNT)
Clue Cells Wet Prep Whiff POC: NEGATIVE
TRICHOMONAS WET PREP HPF POC: ABSENT

## 2017-01-12 NOTE — Progress Notes (Signed)
[facility] Visit  Patient name: Kimberly Morgan MRN [id number]  Date of birth: 15-May-1981  CC & HPI:  Kimberly Morgan is a 36 y.o. G4P2102 Caucasian female presenting today for report of mastitis recently, took antibiotics, then developed yeast infection-was treated and felt like kept coming back, so treated again, feels like it is gone, no more itching, still some discharge, no odor. However, she has now begun to have urinary frequency, urgency, hesitancy along w/ sensation of bladder spasms after she voids. Denies burning w/ urination- which she normally 'always' has w/ UTI. Also bilateral lower back pain.  Kimberly Morgan is old, still breastfeeding. No period since right before getting pregnant. Had to use IVF w/ last 2 pregnancies. Took HPT recently that was neg.  Patient's last menstrual period was 08/22/2015. The current method of family planning is none. Last pap 05/2015 neg  Pertinent History Reviewed:  Medical & Surgical Hx:   Past medical, surgical, family, and social history reviewed in electronic medical record Medications: Reviewed & Updated - see associated section Allergies: Reviewed in electronic medical record  Objective Findings:  Vitals: BP 100/70   Pulse 78   Temp 98.2 F (36.8 C)   Wt 202 lb (91.6 kg)   LMP 08/22/2015   Breastfeeding? Yes   BMI 36.65 kg/m  Body mass index is 36.65 kg/m.  Physical Examination: General appearance - alert, well appearing, and in no distress Pelvic - external genitalia appears normal, cx appears normal, scant amt white nonodorous d/c, neg wet prep No CMT, uterus/adnexae w/o abnormalities, 'just feels sore' w/ palpation  Neg CVAT  Results for orders placed or performed in visit on 01/12/17 (from the past 24 hour(s))  POCT urinalysis dipstick   Collection Time: 01/12/17  1:45 PM  Result Value Ref Range   Color, UA     Clarity, UA     Glucose, UA neg    Bilirubin, UA     Ketones, UA neg    Spec Grav, UA  1.010 -  1.025   Blood, UA neg    pH, UA  5.0 - 8.0   Protein, UA trace    Urobilinogen, UA  0.2 or 1.0 E.U./dL   Nitrite, UA neg    Leukocytes, UA Negative Negative  POCT Wet Prep Mell[place])   Collection Time: 01/12/17  2:19 PM  Result Value Ref Range   Source Wet Prep POC vaginal    WBC, Wet Prep HPF POC few    Bacteria Wet Prep HPF POC None (A) Few   BACTERIA WET PREP MORPHOLOGY POC     Clue Cells Wet Prep HPF POC None None   Clue Cells Wet Prep Whiff POC Negative Whiff    Yeast Wet Prep HPF POC None    KOH Wet Prep POC     Trichomonas Wet Prep HPF POC Absent Absent     Assessment & Plan:  A:   Sx c/w UTI  Breastfeeding  P:  Send urine cx, will call pt w/ results  Declines gc/ct testing  Pt prefers to wait on cx for antibiotics, since all sx started after she took antibiotic for mastitis  Pt prefers yearly pap  Return in about 2 weeks (around 01/26/2017) for pap . and f/u on sx  Marge Duncans CNM, [facility] 01/12/2017 2:19 PM

## 2017-01-14 LAB — URINE CULTURE

## 2017-01-15 ENCOUNTER — Telehealth: Payer: Self-pay | Admitting: Women's Health

## 2017-01-15 NOTE — Telephone Encounter (Signed)
Called pt, notified her urine cx neg, states she is still having uti sx. Discussed w/ JAG- may possibly be IC. Offered pt appt w/ [facility] or [facility] to discuss-pt prefers [facility], states she is driving, so will call back to schedule. In between now and appt cut out caffeine, spicy/greasy foods, etoh, etc to see if sx improve.  Cheral Marker, CNM, [facility] 01/15/2017 12:13 PM

## 2017-01-27 ENCOUNTER — Ambulatory Visit (INDEPENDENT_AMBULATORY_CARE_PROVIDER_SITE_OTHER): Payer: BC Managed Care – PPO | Admitting: Obstetrics and Gynecology

## 2017-01-27 ENCOUNTER — Encounter: Payer: Self-pay | Admitting: Obstetrics and Gynecology

## 2017-01-27 ENCOUNTER — Other Ambulatory Visit (HOSPITAL_COMMUNITY)
Admission: RE | Admit: 2017-01-27 | Discharge: 2017-01-27 | Disposition: A | Discharge status: Home / Self Care | Payer: BC Managed Care – PPO | Source: Ambulatory Visit | Attending: Obstetrics and Gynecology | Admitting: Obstetrics and Gynecology

## 2017-01-27 VITALS — BP 100/60 | HR 76 | Ht 63.0 in | Wt 207.0 lb

## 2017-01-27 DIAGNOSIS — Z01419 Encounter for gynecological examination (general) (routine) without abnormal findings: Secondary | ICD-10-CM | POA: Diagnosis present

## 2017-01-27 NOTE — Progress Notes (Signed)
Patient ID: Kimberly Morgan, female   DOB: 12-Oct-1980, 36 y.o.   MRN: [id number]  Assessment:  Annual Gyn Exam   Plan:  1. pap smear done, next pap due 3 years 2. return annually or prn 3    Annual mammogram advised Subjective:  Kimberly Morgan is a 36 y.o. female [id number] who presents for annual exam. No LMP recorded. The patient has complaints today of no complaints at this time. Denies any other symptoms. She notes that her youngest child is 8 months via IVF. Pt reports that she may go through IVF again for another child. Pt states that she will have to find new IVF physicians due to her prior providers leaving the group.  The following portions of the patient's history were reviewed and updated as appropriate: allergies, current medications, past family history, past medical history, past social history, past surgical history and problem list. Past Medical History:  Diagnosis Date  . History of cerclage, currently pregnant 10/29/2015  . Hypothyroid 10/31/2012  . Hypothyroidism   . 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 Kidney stones   . PONV (postoperative nausea and vomiting)   . Preterm labor   . Seasonal allergies   . Supervision of other high-risk pregnancy 10/29/2015    [facility] at     9+3 weeks FOB  Rudell Cobb Dating By  [place] Pap  05/28/15 negative GC/CT Initial:                36+wks: Genetic Screen NT/IT:  CF screen  Anatomic [place]  Flu vaccine  Tdap Recommended ~ 28wks Glucose Screen  2 hr GBS  Feed Preference  Contraception  Circumcision  Childbirth Classes  Pediatrician    . UTI (lower urinary tract infection)     Past Surgical History:  Procedure Laterality Date  . CERVICAL CERCLAGE  09/30/2011   Procedure: CERCLAGE CERVICAL;  Surgeon: [name], MD;  Location: [facility];  Service: Gynecology;  Laterality: N/A;  . CERVICAL CERCLAGE N/A 12/01/2012   Procedure: [facility] CERCLAGE CERVICAL;  Surgeon: [name], MD;  Location: [facility];  Service: Gynecology;   Laterality: N/A;  . CERVICAL CERCLAGE N/A 11/25/2015   Procedure: CERCLAGE CERVICAL--McDonald cerclage;  Surgeon: [name], MD;  Location: [facility];  Service: Gynecology;  Laterality: N/A;  . CHOLECYSTECTOMY     laparoscopic- 2011     Current Outpatient Prescriptions:  .  levothyroxine (SYNTHROID, LEVOTHROID) 125 MCG tablet, TAKE 1 TABLET BY MOUTH EVERY DAY BEFORE BREAKFAST, Disp: 30 tablet, Rfl: 3 .  Prenatal Vit-Fe Fumarate-FA (MULTIVITAMIN-PRENATAL) 27-0.8 MG TABS tablet, Take 1 tablet by mouth daily at 12 noon., Disp: , Rfl:   Review of Systems Constitutional: negative Gastrointestinal: negative Genitourinary: negative  Objective:  BP 100/60   Pulse 76   Ht 5\' 3"  (1.6 m)   Wt 207 lb (93.9 kg)   BMI 36.67 kg/m    BMI: Body mass index is 36.67 kg/m.  General Appearance: Alert, appropriate appearance for age. No acute distress HEENT: Grossly normal Neck / Thyroid:  Cardiovascular: RRR; normal S1, S2, no murmur Lungs: CTA bilaterally Back: No CVAT Breast Exam: No masses or nodes.No dimpling, nipple retraction or discharge. Gastrointestinal: Soft, non-tender, no masses or organomegaly Pelvic Exam: External genitalia: normal general appearance Vaginal: normal mucosa without prolapse or lesions, normal without tenderness, induration or masses and normal rugae Cervix: normal appearance Adnexa: normal bimanual exam Uterus: normal single, nontender Rectovaginal: not indicated Lymphatic Exam: Non-palpable nodes in neck, clavicular, axillary, or  inguinal regions  Skin: no rash or abnormalities Neurologic: Normal gait and speech, no tremor  Psychiatric: Alert and oriented, appropriate affect.  Urinalysis:Not done  Christin Bach. MD Pgr [phone] 2:58 PM   By signing my name below, I, Soijett Blue, attest that this documentation has been prepared under the direction and in the presence of [name], MD. Electronically Signed: Soijett Blue, ED Scribe. 01/27/17.  2:58 PM.  I personally performed the services described in this documentation, which was SCRIBED in my presence. The recorded information has been reviewed and considered accurate. It has been edited as necessary during review. [name], MD

## 2017-02-01 LAB — CYTOLOGY - PAP
Diagnosis: NEGATIVE
HPV (WINDOPATH): NOT DETECTED

## 2017-03-24 ENCOUNTER — Other Ambulatory Visit: Payer: Self-pay | Admitting: Physician Assistant

## 2017-04-27 ENCOUNTER — Other Ambulatory Visit: Payer: Self-pay | Admitting: Physician Assistant

## 2017-04-28 NOTE — Telephone Encounter (Signed)
Refill appropriate 

## 2017-05-14 ENCOUNTER — Ambulatory Visit: Payer: BC Managed Care – PPO | Admitting: Family Medicine

## 2017-05-14 ENCOUNTER — Encounter: Payer: Self-pay | Admitting: Family Medicine

## 2017-05-14 VITALS — BP 102/64 | HR 100 | Temp 98.6°F | Resp 18 | Ht 63.0 in | Wt 214.4 lb

## 2017-05-14 DIAGNOSIS — B3789 Other sites of candidiasis: Secondary | ICD-10-CM | POA: Diagnosis not present

## 2017-05-14 DIAGNOSIS — O9102 Infection of nipple associated with the puerperium: Principal | ICD-10-CM

## 2017-05-14 MED ORDER — NYSTATIN 100000 UNIT/GM EX CREA: 1.0000 "application " | TOPICAL_CREAM | Freq: Two times a day (BID) | CUTANEOUS | 1 refills | Status: DC

## 2017-05-14 NOTE — Patient Instructions (Signed)
Apply cream twice a day  F/U as needed 

## 2017-05-14 NOTE — Progress Notes (Signed)
Subjective:    Patient ID: Kimberly Morgan, female    DOB: [birth year], 36 y.o.   MRN: [id number]  Patient presents for Rash (skin irritation to nipples- L more than R- painful while nursing)  Here with irritation to her breast she has itching and some redness especially on the left one.  Her daughter has thrush.  She is still breast-feeding normally but does have discomfort with breast-feeding.  She has not had any fever she has had mastitis in the past was not felt any fullness or hard areas she is not been sick otherwise.    Review Of Systems:  GEN- denies fatigue, fever, weight loss,weakness, recent illness HEENT- denies eye drainage, change in vision, nasal discharge, CVS- denies chest pain, palpitations RESP- denies SOB, cough, wheeze ABD- denies N/V, change in stools, abd pain GU- denies dysuria, hematuria, dribbling, incontinence MSK- denies joint pain, muscle aches, injury Neuro- denies headache, dizziness, syncope, seizure activity       Objective:    BP 102/64   Pulse 100   Temp 98.6 F (37 C) (Temporal)   Resp 18   Ht 5\' 3"  (1.6 m)   Wt 214 lb 6.4 oz (97.3 kg)   SpO2 99%   BMI 37.98 kg/m  GEN- NAD, alert and oriented x3 Breast- normal symmetry, no nipple inversion,milk from nipple, no nodules or lumps felt, few erythematous macules around left areola, no peeling skin, no induration         Assessment & Plan:      Problem List Items Addressed This Visit    None    Visit Diagnoses    Yeast infection of nipple, postpartum    -  Primary   Daughter with thrush, given nystatin cream to apply, continue breastfeeding as tolerated. clean with soap and warm water as normally   Relevant Medications   nystatin cream (MYCOSTATIN)      Note: This dictation was prepared with Dragon dictation along with smaller phrase technology. Any transcriptional errors that result from this process are unintentional.

## 2017-06-16 ENCOUNTER — Other Ambulatory Visit: Payer: Self-pay | Admitting: Physician Assistant

## 2017-07-25 ENCOUNTER — Other Ambulatory Visit: Payer: Self-pay | Admitting: Physician Assistant

## 2017-08-23 ENCOUNTER — Telehealth: Payer: Self-pay | Admitting: Women's Health

## 2017-08-23 MED ORDER — FLUCONAZOLE 150 MG PO TABS: 150.0000 mg | ORAL_TABLET | Freq: Once | ORAL | 0 refills | Status: AC

## 2017-08-23 NOTE — Telephone Encounter (Signed)
patient called stating that she would like Selena BattenKim or Dr. Emelda FearFerguson to call her in some Diflucan in to her pharmacy at [facility] on [address]. Please contact pt

## 2017-08-23 NOTE — Telephone Encounter (Signed)
Patient states she is having a terrible yeast infection and is requesting Diflucan be sent to pharmacy.  Please advise.

## 2017-08-24 NOTE — Telephone Encounter (Signed)
LMOVM that Diflucan was sent and but needs to be seen if not better after taking.

## 2017-09-20 ENCOUNTER — Other Ambulatory Visit: Payer: Self-pay | Admitting: Physician Assistant

## 2017-09-20 NOTE — Telephone Encounter (Signed)
Patient is due for an office visit. Letter sent via mychart for patient to call and schedule an appointment

## 2017-10-18 ENCOUNTER — Other Ambulatory Visit: Payer: Self-pay | Admitting: Physician Assistant

## 2017-10-29 ENCOUNTER — Other Ambulatory Visit: Payer: Self-pay | Admitting: Physician Assistant

## 2017-11-19 ENCOUNTER — Telehealth: Payer: Self-pay | Admitting: Obstetrics and Gynecology

## 2017-11-19 NOTE — Telephone Encounter (Signed)
Patient called stating that she would like a call back from Dr. Rayna SextonFerguson's nurse. Pt did not state the reason why. Please contact pt

## 2017-11-19 NOTE — Telephone Encounter (Signed)
Called patient back and she states that she keeps getting yeast infections like every two weeks. I advised that we should see her to evaluate this. Patient is agreeable. [facility].

## 2017-11-22 ENCOUNTER — Encounter: Payer: Self-pay | Admitting: Adult Health

## 2017-11-22 ENCOUNTER — Ambulatory Visit: Payer: BC Managed Care – PPO | Admitting: Adult Health

## 2017-11-22 VITALS — BP 121/79 | HR 85 | Ht 63.0 in | Wt 210.0 lb

## 2017-11-22 DIAGNOSIS — B373 Candidiasis of vulva and vagina: Secondary | ICD-10-CM | POA: Diagnosis not present

## 2017-11-22 DIAGNOSIS — B3731 Acute candidiasis of vulva and vagina: Secondary | ICD-10-CM

## 2017-11-22 DIAGNOSIS — N898 Other specified noninflammatory disorders of vagina: Secondary | ICD-10-CM

## 2017-11-22 LAB — POCT WET PREP (WET MOUNT)

## 2017-11-22 MED ORDER — FLUCONAZOLE 150 MG PO TABS: ORAL_TABLET | ORAL | 6 refills | Status: DC

## 2017-11-22 NOTE — Progress Notes (Signed)
Subjective:     Patient ID: Kimberly Morgan, female   DOB: 10-30-1980, 37 y.o.   MRN: [id number]  HPI Kimberly Morgan is a 38 year old white female,married in complaining of vaginal discharge, ?eyast, used OTC monistat but it burns and seems to be worse before period, which started back in April and has been every 28 days, she is still breastfeeding at night for about 10 minutes, [name] is almost 79 months old.   Review of Systems +vaginal discharge, ?yeast used OTC monistat but it burns Reviewed past medical,surgical, social and family history. Reviewed medications and allergies.     Objective:   Physical Exam BP 121/79 (BP Location: Left Arm, Patient Position: Sitting, Cuff Size: Large)   Pulse 85   Ht 5\' 3"  (1.6 m)   Wt 210 lb (95.3 kg)   LMP 11/11/2017   Breastfeeding? Yes Comment: just at night  BMI 37.20 kg/m   Skin warm and dry.Pelvic: external genitalia is normal in appearance no lesions, vagina: white discharge without odor,urethra has no lesions or masses noted, cervix:smooth and bulbous, uterus: normal size, shape and contour, non tender, no masses felt, adnexa: no masses or tenderness noted. Bladder is non tender and no masses felt. Wet prep: + for yeast and few +WBCs.    Assessment:     1. Vaginal discharge   2. Yeast infection of the vagina       Plan:     Meds ordered this encounter  Medications  . fluconazole (DIFLUCAN) 150 MG tablet    Sig: Take 1 now and 1 in 3 days and then 1 monthly before period    Dispense:  2 tablet    Refill:  6    Order Specific Question:   Supervising Provider    Answer:   Duane Lope H [[id number]]  F/U prn

## 2017-11-25 ENCOUNTER — Other Ambulatory Visit: Payer: Self-pay | Admitting: Physician Assistant

## 2017-12-01 ENCOUNTER — Encounter: Payer: Self-pay | Admitting: Physician Assistant

## 2017-12-01 ENCOUNTER — Other Ambulatory Visit: Payer: Self-pay

## 2017-12-01 ENCOUNTER — Ambulatory Visit (INDEPENDENT_AMBULATORY_CARE_PROVIDER_SITE_OTHER): Payer: BC Managed Care – PPO | Admitting: Physician Assistant

## 2017-12-01 VITALS — BP 110/76 | HR 80 | Temp 98.2°F | Resp 16 | Ht 63.0 in | Wt 212.0 lb

## 2017-12-01 DIAGNOSIS — E039 Hypothyroidism, unspecified: Secondary | ICD-10-CM | POA: Diagnosis not present

## 2017-12-01 DIAGNOSIS — Z0000 Encounter for general adult medical examination without abnormal findings: Secondary | ICD-10-CM | POA: Diagnosis not present
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
## 2017-12-02 LAB — TSH: TSH: 0.57 mIU/L

## 2017-12-07 ENCOUNTER — Other Ambulatory Visit: Payer: Self-pay

## 2017-12-07 MED ORDER — LEVOTHYROXINE SODIUM 125 MCG PO TABS: ORAL_TABLET | ORAL | 3 refills | Status: DC

## 2017-12-23 ENCOUNTER — Other Ambulatory Visit: Payer: Self-pay | Admitting: Physician Assistant

## 2018-01-27 ENCOUNTER — Other Ambulatory Visit: Payer: BC Managed Care – PPO | Admitting: Obstetrics and Gynecology

## 2018-03-03 ENCOUNTER — Other Ambulatory Visit: Payer: BC Managed Care – PPO | Admitting: Obstetrics and Gynecology

## 2018-04-13 ENCOUNTER — Other Ambulatory Visit: Payer: Self-pay

## 2018-04-13 ENCOUNTER — Ambulatory Visit: Payer: BC Managed Care – PPO | Admitting: Obstetrics and Gynecology

## 2018-04-13 ENCOUNTER — Encounter: Payer: Self-pay | Admitting: Obstetrics and Gynecology

## 2018-04-13 ENCOUNTER — Other Ambulatory Visit (HOSPITAL_COMMUNITY)
Admission: RE | Admit: 2018-04-13 | Discharge: 2018-04-13 | Disposition: A | Discharge status: Home / Self Care | Payer: BC Managed Care – PPO | Source: Ambulatory Visit | Attending: Obstetrics and Gynecology | Admitting: Obstetrics and Gynecology

## 2018-04-13 ENCOUNTER — Other Ambulatory Visit: Payer: BC Managed Care – PPO | Admitting: Obstetrics and Gynecology

## 2018-04-13 VITALS — BP 138/77 | HR 82 | Ht 63.0 in | Wt 220.0 lb

## 2018-04-13 DIAGNOSIS — Z01419 Encounter for gynecological examination (general) (routine) without abnormal findings: Secondary | ICD-10-CM | POA: Diagnosis present

## 2018-04-13 NOTE — Progress Notes (Signed)
Patient ID: Kimberly Morgan, female   DOB: 1980-09-25, 37 y.o.   MRN: [id number]   Assessment:  Annual Gyn Exam 2.  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  History of cryocautery as a teen, records not available Plan:  1. pap smear done, next pap due 1 year due to history of cryocautery of the cervix 2. return annually or prn 3    Annual mammogram advised after age 79 Subjective:  Kimberly Morgan is a 37 y.o. female [phone] who presents for annual exam. No LMP recorded. The patient has complaints today of none.  She is considering having a third pregnancy she has IVF 8-day embryos frozen until April, husband wants to try for a boy but they have put off decision until then. Has family hx of uterine fibroids, sister had hysterectomy due to fibroids age 34  The following portions of the patient's history were reviewed and updated as appropriate: allergies, current medications, past family history, past medical history, past social history, past surgical history and problem list. Past Medical History:  Diagnosis Date  . History of cerclage, currently pregnant 10/29/2015  . Hypothyroid 10/31/2012  . Hypothyroidism   . 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 Kidney stones   . PONV (postoperative nausea and vomiting)   . Preterm labor   . Seasonal allergies   . Supervision of other high-risk pregnancy 10/29/2015    [facility] at     9+3 weeks FOB  Rudell Cobb Dating By  [place] Pap  05/28/15 negative GC/CT Initial:                36+wks: Genetic Screen NT/IT:  CF screen  Anatomic [place]  Flu vaccine  Tdap Recommended ~ 28wks Glucose Screen  2 hr GBS  Feed Preference  Contraception  Circumcision  Childbirth Classes  Pediatrician    . UTI (lower urinary tract infection)     Past Surgical History:  Procedure Laterality Date  . CERVICAL CERCLAGE  09/30/2011   Procedure: CERCLAGE CERVICAL;  Surgeon: [name], MD;  Location: [facility];   Service: Gynecology;  Laterality: N/A;  . CERVICAL CERCLAGE N/A 12/01/2012   Procedure: [facility] CERCLAGE CERVICAL;  Surgeon: [name], MD;  Location: [facility];  Service: Gynecology;  Laterality: N/A;  . CERVICAL CERCLAGE N/A 11/25/2015   Procedure: CERCLAGE CERVICAL--McDonald cerclage;  Surgeon: [name], MD;  Location: [facility];  Service: Gynecology;  Laterality: N/A;  . CHOLECYSTECTOMY     laparoscopic- 2011     Current Outpatient Medications:  .  fluconazole (DIFLUCAN) 150 MG tablet, Take 1 now and 1 in 3 days and then 1 monthly before period, Disp: 2 tablet, Rfl: 6 .  levothyroxine (SYNTHROID, LEVOTHROID) 125 MCG tablet, TAKE 1 TABLET BY MOUTH EVERY DAY BEFORE BREAKFAST, Disp: 30 tablet, Rfl: 3 .  levothyroxine (SYNTHROID, LEVOTHROID) 125 MCG tablet, TAKE 1 TABLET BY MOUTH EVERY DAY BEFORE BREAKFAST, Disp: 30 tablet, Rfl: 0 .  nystatin cream (MYCOSTATIN), Apply 1 application topically 2 (two) times daily. Until clear, Disp: 30 g, Rfl: 1 .  Prenatal Vit-Fe Fumarate-FA (MULTIVITAMIN-PRENATAL) 27-0.8 MG TABS tablet, Take 1 tablet by mouth daily at 12 noon., Disp: , Rfl:   Review of Systems Constitutional: negative Gastrointestinal: negative Genitourinary: normal  Objective:  There were no vitals taken for this visit.   BMI: There is no height or weight on file to calculate BMI.  General Appearance: Alert, appropriate appearance for age. No  acute distress HEENT: Grossly normal Neck / Thyroid:  Cardiovascular: RRR; normal S1, S2, no murmur Lungs: CTA bilaterally Back: No CVAT Gastrointestinal: Soft, non-tender, no masses or organomegaly Pelvic Exam:  VAGINA: normal CERVIX: Easily visualized, irregular scar tissue nodule on anterior lip of cervix, from previous 2 cerclage UTERUS: anterior, appears normal in size and shape ADNEXA: Negative PAP: Pap smear done today. Lymphatic Exam: Non-palpable nodes in neck, clavicular, axillary, or inguinal regions  Skin: no rash or  abnormalities Neurologic: Normal gait and speech, no tremor  Psychiatric: Alert and oriented, appropriate affect.  Urinalysis:Not done  By signing my name below, I, Arnette Norris, attest that this documentation has been prepared under the direction and in the presence of [name], MD. Electronically Signed: Arnette Norris Medical Scribe. 04/13/18. 4:07 PM.  I personally performed the services described in this documentation, which was SCRIBED in my presence. The recorded information has been reviewed and considered accurate. It has been edited as necessary during review. [name], MD

## 2018-04-15 LAB — CYTOLOGY - PAP
DIAGNOSIS: NEGATIVE
HPV: NOT DETECTED

## 2018-04-26 ENCOUNTER — Other Ambulatory Visit: Payer: Self-pay | Admitting: *Deleted

## 2018-04-26 MED ORDER — LEVOTHYROXINE SODIUM 125 MCG PO TABS: ORAL_TABLET | ORAL | 3 refills | Status: DC

## 2018-06-02 ENCOUNTER — Ambulatory Visit: Payer: BC Managed Care – PPO | Admitting: Obstetrics & Gynecology

## 2018-06-02 ENCOUNTER — Encounter: Payer: Self-pay | Admitting: Obstetrics & Gynecology

## 2018-06-02 ENCOUNTER — Other Ambulatory Visit: Payer: Self-pay

## 2018-06-02 VITALS — BP 142/89 | HR 111 | Ht 63.0 in | Wt 220.0 lb

## 2018-06-02 DIAGNOSIS — N8311 Corpus luteum cyst of right ovary: Secondary | ICD-10-CM

## 2018-06-02 MED ORDER — MEGESTROL ACETATE 40 MG PO TABS: 40.0000 mg | ORAL_TABLET | Freq: Every day | ORAL | 0 refills | Status: DC

## 2018-06-02 NOTE — Progress Notes (Signed)
Chief Complaint  Patient presents with  . right side pain    breakthrough bleeding on 1/2      38 y.o. [id number] Patient's last menstrual period was 05/17/2018. The current method of family planning is none.  Outpatient Encounter Medications as of 06/02/2018  Medication Sig  . fluconazole (DIFLUCAN) 150 MG tablet Take 1 now and 1 in 3 days and then 1 monthly before period  . levothyroxine (SYNTHROID, LEVOTHROID) 125 MCG tablet TAKE 1 TABLET BY MOUTH EVERY DAY BEFORE BREAKFAST  . Prenatal Vit-Fe Fumarate-FA (MULTIVITAMIN-PRENATAL) 27-0.8 MG TABS tablet Take 1 tablet by mouth daily at 12 noon.  . megestrol (MEGACE) 40 MG tablet Take 1 tablet (40 mg total) by mouth daily.   No facility-administered encounter medications on file as of 06/02/2018.     Subjective Pt with 3 weeks of intermittent sharp to dull to sharp RLQ No fever or chills gnerally has some pain on that side with ovulvation not with cycle Hurt with her cycle around Christmas Has BTB old blood 1/2 resolved None of this has happened before Past Medical History:  Diagnosis Date  . History of cerclage, currently pregnant 10/29/2015  . Hypothyroid 10/31/2012  . Hypothyroidism   . 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 Kidney stones   . PONV (postoperative nausea and vomiting)   . Preterm labor   . Seasonal allergies   . Supervision of other high-risk pregnancy 10/29/2015    [facility] at     9+3 weeks FOB  Rudell CobbJosh Peavler Dating By  US Pap  05/28/15 negative GC/CT Initial:                36+wks: Genetic Screen NT/IT:  CF screen  Anatomic US  Flu vaccine  Tdap Recommended ~ 28wks Glucose Screen  2 hr GBS  Feed Preference  Contraception  Circumcision  Childbirth Classes  Pediatrician    . UTI (lower urinary tract infection)     Past Surgical History:  Procedure Laterality Date  . CERVICAL CERCLAGE  09/30/2011   Procedure: CERCLAGE CERVICAL;  Surgeon: Tilda BurrowJohn V Ferguson, MD;  Location: [facility];  Service: Gynecology;   Laterality: N/A;  . CERVICAL CERCLAGE N/A 12/01/2012   [facility];  Surgeon: Tilda BurrowJohn V Ferguson, MD;  Location: [facility];  Service: Gynecology;  Laterality: N/A;  . CERVICAL CERCLAGE N/A 11/25/2015   Procedure: CERCLAGE CERVICAL--[facility] cerclage;  Surgeon: Tilda BurrowJohn V Ferguson, MD;  Location: [facility];  Service: Gynecology;  Laterality: N/A;  . 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 Aspirin Anaphylaxis  . Bactrim [Sulfamethoxazole-Trimethoprim] Nausea And Vomiting  . Baker's Yeast [Yeast] Hives and Other (See Comments)    Reaction: extreme all over body yeast infections.  . Latex Rash    Social History   Socioeconomic History  . Marital status: Married    Spouse name:  Not on file  . Number of children: Not on file  . Years of education: Not on file  . Highest education level: Not on file  Occupational History  . Not on file  Social Needs  . Financial resource strain: Not on file  . Food insecurity:    Worry: Not on file    Inability: Not on file  . Transportation needs:    Medical: Not on file    Non-medical: Not on file  Tobacco Use  . Smoking status: Former Smoker    Packs/day: 0.25    Years: 10.00    Pack years: 2.50    Types: Cigarettes    Last attempt to quit: 07/24/2011    Years since quitting: 6.8  . Smokeless tobacco: Never Used  Substance and Sexual Activity  . Alcohol use: No  . Drug use: No  . Sexual activity: Yes    Birth control/protection: None  Lifestyle  . Physical activity:    Days per week: Not on file    Minutes per session: Not on file  . Stress: Not on file    Relationships  . Social connections:    Talks on phone: Not on file    Gets together: Not on file    Attends religious service: Not on file    Active member of club or organization: Not on file    Attends meetings of clubs or organizations: Not on file    Relationship status: Not on file  Other Topics Concern  . Not on file  Social History Narrative  . Not on file    Family History  Problem Relation Age of Onset  . COPD Mother   . Hypertension Sister   . Cancer Maternal Grandfather        lung  . Diabetes Paternal Grandfather     Medications:       Current Outpatient Medications:  .  fluconazole (DIFLUCAN) 150 MG tablet, Take 1 now and 1 in 3 days and then 1 monthly before period, Disp: 2 tablet, Rfl: 6 .  levothyroxine (SYNTHROID, LEVOTHROID) 125 MCG tablet, TAKE 1 TABLET BY MOUTH EVERY DAY BEFORE BREAKFAST, Disp: 30 tablet, Rfl: 3 .  Prenatal Vit-Fe Fumarate-FA (MULTIVITAMIN-PRENATAL) 27-0.8 MG TABS tablet, Take 1 tablet by mouth daily at 12 noon., Disp: , Rfl:  .  megestrol (MEGACE) 40 MG tablet, Take 1 tablet (40 mg total) by mouth daily., Disp: 30 tablet, Rfl: 0  Objective Blood pressure (!) 142/89, pulse (!) 111, height 5\' 3"  (1.6 m), weight 220 lb (99.8 kg), last menstrual period 05/17/2018, currently breastfeeding.  General WDWN female NAD Vulva:  normal appearing vulva with no masses, tenderness or lesions Vagina:  normal mucosa, no discharge Cervix:  no cervical motion tenderness and no lesions Uterus:  normal size, contour, position, consistency, mobility, non-tender Adnexa: ovaries:present,  normal adnexa in size, nontender and no masses   Pertinent ROS No burning with urination, frequency or urgency No nausea, vomiting or diarrhea Nor fever chills or other constitutional symptoms   Labs or studies     Impression Diagnoses this Encounter::   ICD-10-CM   1. Corpus luteum cyst of right ovary, persistent hemorrhagic N83.11     Established relevant  diagnosis(es):   Plan/Recommendations: Meds ordered this encounter  Medications  . megestrol (MEGACE) 40 MG tablet    Sig: Take 1 tablet (40 mg total) by mouth daily.    Dispense:  30 tablet    Refill:  0    Labs  or Scans Ordered: No orders of the defined types were placed in this encounter.   Management:: >will use megestrol for presumed persistent hemorrhagic CL of right ovary >if worsens or persists will order sonogram, no indication at present given normal ovary on exam  Follow up Return if symptoms worsen or fail to improve.       All questions were answered.

## 2018-06-06 ENCOUNTER — Other Ambulatory Visit: Payer: Self-pay | Admitting: Adult Health

## 2018-08-24 ENCOUNTER — Other Ambulatory Visit: Payer: Self-pay | Admitting: Family Medicine

## 2018-09-06 ENCOUNTER — Telehealth: Payer: Self-pay | Admitting: Obstetrics and Gynecology

## 2018-09-06 NOTE — Telephone Encounter (Signed)
Patient called, stated she is having severe pain in her right side.  Last night it was excruciating.  Stated she saw Eure for it in December, around ovulation it's the worst.  She would like an appointment with Emelda Fear.  [employer]  [phone]

## 2018-09-06 NOTE — Telephone Encounter (Signed)
sure

## 2018-09-06 NOTE — Telephone Encounter (Signed)
Called patient and scheduled visit with Dr. Emelda Fear.

## 2018-09-07 ENCOUNTER — Other Ambulatory Visit: Payer: Self-pay

## 2018-09-07 ENCOUNTER — Encounter: Payer: Self-pay | Admitting: Obstetrics and Gynecology

## 2018-09-07 ENCOUNTER — Ambulatory Visit: Payer: BC Managed Care – PPO | Admitting: Obstetrics and Gynecology

## 2018-09-07 VITALS — BP 146/90 | HR 128 | Temp 98.6°F | Ht 63.0 in | Wt 217.0 lb

## 2018-09-07 DIAGNOSIS — N940 Mittelschmerz: Secondary | ICD-10-CM

## 2018-09-07 MED ORDER — HYDROCODONE-ACETAMINOPHEN 5-325 MG PO TABS: 1.0000 | ORAL_TABLET | Freq: Four times a day (QID) | ORAL | 0 refills | Status: DC | PRN

## 2018-09-07 NOTE — Patient Instructions (Addendum)
Mittelschmerz Sending in a prescription for Toradol to be used every 6 hours when this happens again  Mittelschmerz is pain in the belly (abdomen) that affects women. The pain happens between periods. The pain:  Affects one side of the belly. The side may change from month to month.  May be mild or very bad.  May last from minutes to hours. It does not last longer than 1-2 days.  Can happen with a feeling of being sick to your stomach (nausea).  Can happen with a small amount of bleeding from your vagina. Mittelschmerz is caused by the growth and release of an egg from an ovary. It is a natural part of the ovulation cycle. It often happens about 2 weeks after a woman's period ends. Follow these instructions at home: Watch for any changes in your condition. Let your doctor know about them. Take these actions to help with your pain:  Try soaking in a hot bath.  Try a heating pad to relax the muscles in the belly.  Take over-the-counter and prescription medicines only as told by your doctor.  Keep all follow-up visits as told by your doctor. This is important. Contact a doctor if:  The pain is very bad most months.  The pain lasts longer than 24 hours.  Your pain medicine is not helping.  You have a fever.  You feel sick to your stomach, and the feeling does not go away.  You keep throwing up (vomiting).  You miss your period.  You have more than a small amount of blood coming from your vagina between periods. Summary  Mittelschmerz is a condition that affects women. It is pain in the belly that happens between periods.  This condition is caused by the growth and release of an egg from an ovary.  The pain may affect one side of the belly, may be mild or very bad, or may cause you to feel sick to your stomach.  To help with your pain, soak in a hot bath, use a heating pad, or take medicines.  Watch for any changes in your condition. Know when to contact a doctor for  help. This information is not intended to replace advice given to you by your health care provider. Make sure you discuss any questions you have with your health care provider. Document Released: 06/18/2004 Document Revised: 11/23/2017 Document Reviewed: 11/23/2017 Elsevier Interactive Patient Education  2019 [employer].

## 2018-09-07 NOTE — Progress Notes (Signed)
[facility] Clinic Visit  @DATE @            Patient name: Kimberly Morgan MRN [id number]  Date of birth: 12-14-1980  CC & HPI:  Kimberly Morgan is a 38 y.o. female presenting today for evaluation of acute onset of right lower quadrant pain on Sunday cycle day 11.  She has had a prior episode of this which was interpreted as ovulation pain.  She consider going to the emergency room.  She does not have any analgesics at home.  Tylenol did not help.  The pain resolved after couple hours.  ROS:  ROS No fever chills pain has essentially resolved.  Cervical mucus was thin and watery at the time of pain and is now returned to normal thickness Pertinent History Reviewed:   Reviewed: Significant for prior episodes similar at ovulation time Medical         Past Medical History:  Diagnosis Date  . History of cerclage, currently pregnant 10/29/2015  . Hypothyroid 10/31/2012  . Hypothyroidism   . 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 Kidney stones   . PONV (postoperative nausea and vomiting)   . Preterm labor   . Seasonal allergies   . Supervision of other high-risk pregnancy 10/29/2015    [facility] at     9+3 weeks FOB  Josh Carmen Dating By  US Pap  05/28/15 negative GC/CT Initial:                36 +wks: Genetic Screen NT/IT:  CF screen  Anatomic [place]  Flu vaccine  Tdap Recommended ~ 28wks Glucose Screen  2 hr GBS  Feed Preference  Contraception  Circumcision  Childbirth Classes  Pediatrician    . UTI (lower urinary tract infection)                               Surgical Hx:    Past Surgical History:  Procedure Laterality Date  . CERVICAL CERCLAGE  09/30/2011   Procedure: CERCLAGE CERVICAL;  Surgeon: [name], MD;  Location: [facility];  Service: Gynecology;  Laterality: N/A;  . CERVICAL CERCLAGE N/A 12/01/2012   Procedure: [facility] CERCLAGE CERVICAL;  Surgeon: [name], MD;  Location: [facility];  Service: Gynecology;  Laterality: N/A;  . CERVICAL CERCLAGE N/A 11/25/2015   Procedure: CERCLAGE CERVICAL--[facility] cerclage;  Surgeon: [name], MD;  Location: [facility];  Service: Gynecology;  Laterality: N/A;  . CHOLECYSTECTOMY     laparoscopic- 2011   Medications: Reviewed & Updated - see associated section                       Current Outpatient Medications:  .  fluconazole (DIFLUCAN) 150 MG tablet, TAKE 1 TABLET BY MOUTH NOW, REPEAT IN 3 DAYS, AND TAKE 1 TABLET MONTHLY BEFORE PERIOD, Disp: 2 tablet, Rfl: 6 .  levothyroxine (SYNTHROID, LEVOTHROID) 125 MCG tablet, TAKE 1 TABLET BY MOUTH EVERY DAY BEFORE BREAKFAST, Disp: 30 tablet, Rfl: 3 .  megestrol (MEGACE) 40 MG tablet, Take 1 tablet (40 mg total) by mouth daily., Disp: 30 tablet, Rfl: 0 .  Prenatal Vit-Fe Fumarate-FA (MULTIVITAMIN-PRENATAL) 27-0.8 MG TABS tablet, Take 1 tablet by mouth daily at 12 noon., Disp: , Rfl:    Social History: Reviewed -  reports that she quit smoking about 7 years ago. Her smoking use included cigarettes. She has a 2.50 pack-year smoking history. She has never used  smokeless tobacco.  Objective Findings:  Vitals: Blood pressure (!) 146/90, pulse (!) 128, temperature 98.6 F (37 C), height 5\' 3"  (1.6 m), weight 98.4 kg, last menstrual period 08/27/2018, currently breastfeeding.  PHYSICAL EXAMINATION General appearance - alert, well appearing, and in no distress Mental status - alert, oriented to person, place, and time Chest - clear to auscultation, no wheezes, rales or rhonchi, symmetric air entry Heart - normal rate and regular rhythm Abdomen - soft, nontender, nondistended, no masses or organomegaly Breasts -  Skin - normal coloration and turgor, no rashes, no suspicious skin lesions noted  PELVIC External genitalia - normal  Vulva - normal  Vagina -  Cervix -normal white mucus Uterus -sinus performed.  Uterus is anteverted normal size shape and contour nontender on bimanual with the vaginal probe Adnexa -left ovary is obscured.  The right ovary is noted 3.0 cm x 4.2  cm with 3 small follicular cysts all less than 1 cm [place] -not done Rectal - rectal exam not indicated    Assessment & Plan:   A:  1. Mittelschmerz  P:  1. Due to patient's allergy to aspirin will avoid Toradol 2. Given small number of Vicodin to take as needed for severe episodes of mittelschmerz 3. Follow-up as Needed

## 2019-01-16 ENCOUNTER — Other Ambulatory Visit: Payer: Self-pay | Admitting: Adult Health

## 2019-07-20 ENCOUNTER — Ambulatory Visit (INDEPENDENT_AMBULATORY_CARE_PROVIDER_SITE_OTHER): Payer: BC Managed Care – PPO | Admitting: Obstetrics and Gynecology

## 2019-07-20 ENCOUNTER — Other Ambulatory Visit (HOSPITAL_COMMUNITY)
Admission: RE | Admit: 2019-07-20 | Discharge: 2019-07-20 | Disposition: A | Discharge status: Home / Self Care | Payer: BC Managed Care – PPO | Source: Ambulatory Visit | Attending: Obstetrics and Gynecology | Admitting: Obstetrics and Gynecology

## 2019-07-20 ENCOUNTER — Encounter: Payer: Self-pay | Admitting: Obstetrics and Gynecology

## 2019-07-20 ENCOUNTER — Other Ambulatory Visit: Payer: Self-pay

## 2019-07-20 VITALS — BP 126/86 | HR 79 | Ht 63.75 in | Wt 218.6 lb

## 2019-07-20 DIAGNOSIS — Z01419 Encounter for gynecological examination (general) (routine) without abnormal findings: Secondary | ICD-10-CM | POA: Insufficient documentation

## 2019-07-20 NOTE — Progress Notes (Signed)
Assessment:  Annual Gyn Exam  Plan:  1. pap smear done, next pap due 3 years 2. return q 5 yr after 40 , or prn 3    Annual mammogram advised after age 38 Subjective:  Kimberly Morgan is a 39 y.o. female [phone] who presents for annual exam. Patient's last menstrual period was 07/09/2019. The patient has complaints today of none.  The following portions of the patient's history were reviewed and updated as appropriate: allergies, current medications, past family history, past medical history, past social history, past surgical history and problem list. Past Medical History:  Diagnosis Date  . History of cerclage, currently pregnant 10/29/2015  . Hypothyroid 10/31/2012  . Hypothyroidism   . 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 Kidney stones   . PONV (postoperative nausea and vomiting)   . Preterm labor   . Seasonal allergies   . Supervision of other high-risk pregnancy 10/29/2015    [facility] at     9+3 weeks FOB  Rudell Cobb Dating By  [place] Pap  05/28/15 negative GC/CT Initial:                36+wks: Genetic Screen NT/IT:  CF screen  Anatomic [place]  Flu vaccine  Tdap Recommended ~ 28wks Glucose Screen  2 hr GBS  Feed Preference  Contraception  Circumcision  Childbirth Classes  Pediatrician    . UTI (lower urinary tract infection)     Past Surgical History:  Procedure Laterality Date  . CERVICAL CERCLAGE  09/30/2011   Procedure: CERCLAGE CERVICAL;  Surgeon: [name], MD;  Location: [facility];  Service: Gynecology;  Laterality: N/A;  . CERVICAL CERCLAGE N/A 12/01/2012   Procedure: [facility] CERCLAGE CERVICAL;  Surgeon: [name], MD;  Location: [facility];  Service: Gynecology;  Laterality: N/A;  . CERVICAL CERCLAGE N/A 11/25/2015   Procedure: CERCLAGE CERVICAL--[facility] cerclage;  Surgeon: [name], MD;  Location: [facility];  Service: Gynecology;  Laterality: N/A;  . CHOLECYSTECTOMY     laparoscopic- 2011     Current Outpatient Medications:  .  Acetaminophen  (TYLENOL PO), Take by mouth as needed., Disp: , Rfl:  .  fluconazole (DIFLUCAN) 150 MG tablet, TAKE 1 TABLET BY MOUTH NOW. REPEAT IN 3 DAYS, AND TAKE 1 TABLET MONTHLY BEFORE PERIOD, Disp: 2 tablet, Rfl: 6 .  levothyroxine (SYNTHROID) 137 MCG tablet, levothyroxine 137 mcg tablet  Take 1 tablet every day by oral route., Disp: , Rfl:  .  Multiple Vitamins-Minerals (HAIR SKIN NAILS PO), Take by mouth daily., Disp: , Rfl:   Review of Systems Constitutional: negative Gastrointestinal: negative Genitourinary: normal  Objective:  BP 126/86 (BP Location: Right Arm, Patient Position: Sitting, Cuff Size: Large)   Pulse 79   Ht 5' 3.75" (1.619 m)   Wt 218 lb 9.6 oz (99.2 kg)   LMP 07/09/2019   Breastfeeding No   BMI 37.82 kg/m    BMI: Body mass index is 37.82 kg/m.  General Appearance: Alert, appropriate appearance for age. No acute distress HEENT: Grossly normal Neck / Thyroid:  Cardiovascular: RRR; normal S1, S2, no murmur Lungs: CTA bilaterally Back: No CVAT Breast Exam: No masses or nodes.No dimpling, nipple retraction or discharge. Gastrointestinal: Soft, non-tender, no masses or organomegaly Pelvic Exam:  VAGINA: normal appearing vagina  CERVIX: normal appearing cervix multiparous, well supported, UTERUS: uterus is normal and nontender  PAP: Pap smear done today. Lymphatic Exam: Non-palpable nodes in neck, clavicular, axillary, or inguinal regions Skin: no rash or abnormalities Neurologic:  Normal gait and speech, no tremor  Psychiatric: Alert and oriented, appropriate affect.  Urinalysis:Not done  By signing my name below, I, Samul Dada, attest that this documentation has been prepared under the direction and in the presence of Jonnie Kind, MD. Electronically Signed: [place]. 07/20/19. 8:53 AM.  I personally performed the services described in this documentation, which was SCRIBED in my presence. The recorded information has been reviewed and considered  accurate. It has been edited as necessary during review. Jonnie Kind, MD

## 2019-07-20 NOTE — Addendum Note (Signed)
Addended by: Colen Darling on: 07/20/2019 09:22 AM   Modules accepted: Orders

## 2019-07-21 LAB — CYTOLOGY - PAP
Adequacy: ABSENT
Comment: NEGATIVE
Diagnosis: NEGATIVE
High risk HPV: NEGATIVE

## 2019-10-03 ENCOUNTER — Encounter: Payer: BC Managed Care – PPO | Admitting: Nurse Practitioner

## 2019-10-11 ENCOUNTER — Encounter: Payer: BC Managed Care – PPO | Admitting: Family Medicine

## 2019-10-30 ENCOUNTER — Encounter: Payer: Self-pay | Admitting: Nurse Practitioner

## 2019-10-30 ENCOUNTER — Other Ambulatory Visit: Payer: Self-pay

## 2019-10-30 ENCOUNTER — Ambulatory Visit (INDEPENDENT_AMBULATORY_CARE_PROVIDER_SITE_OTHER): Payer: BC Managed Care – PPO | Admitting: Nurse Practitioner

## 2019-10-30 VITALS — BP 116/78 | HR 88 | Temp 97.7°F | Resp 18 | Ht 63.0 in | Wt 214.2 lb

## 2019-10-30 DIAGNOSIS — Z1321 Encounter for screening for nutritional disorder: Secondary | ICD-10-CM

## 2019-10-30 DIAGNOSIS — N644 Mastodynia: Secondary | ICD-10-CM

## 2019-10-30 DIAGNOSIS — Z0000 Encounter for general adult medical examination without abnormal findings: Secondary | ICD-10-CM

## 2019-10-30 DIAGNOSIS — Z13220 Encounter for screening for lipoid disorders: Secondary | ICD-10-CM

## 2019-10-30 DIAGNOSIS — Z0001 Encounter for general adult medical examination with abnormal findings: Secondary | ICD-10-CM | POA: Diagnosis not present

## 2019-10-30 DIAGNOSIS — Z6837 Body mass index (BMI) 37.0-37.9, adult: Secondary | ICD-10-CM

## 2019-10-30 DIAGNOSIS — Z13228 Encounter for screening for other metabolic disorders: Secondary | ICD-10-CM | POA: Diagnosis not present

## 2019-10-30 DIAGNOSIS — E039 Hypothyroidism, unspecified: Secondary | ICD-10-CM | POA: Diagnosis not present

## 2019-10-30 NOTE — Progress Notes (Signed)
Established Patient Office Visit  Subjective:  Patient ID: Kimberly Morgan, female    DOB: [birth year]  Age: 39 y.o. MRN: [id number]  CC:  Chief Complaint  Patient presents with  . Annual Exam    HPI Kimberly Morgan is a 39 year old female presenting for annual general adult examination. She had Tdap 8 years ago after stepping on a nail. She had COVID virus in 07/2019 and plans on COVID antibodies. She is not considering vaccine at this time. She has GYN last Pap in 06/2019. Her LMS was 10/20/2019. She has h/o hypothyroidism. She reports difficulty loosing weight and hair thinning and falling out more often recently over past few months along with over last year having monthly yeast vaginal infections. Her GYN vaginal wet prep checked her in 06/2019 however presribing monthly not rechecking. She was encouraged to make appt for wet prep next time she feels she has infection to assure this is the diagnoses, also will check A1C lab today along with elevated BMI.  A month ago she was placing her child's carseat and felt pain to her left breast. Since then the pain has not gone away. She does not feel lumps. Her GYN assessed and did per pt reportedly did not find lumps. She reports feeling anxiety over the fact that she continues to feel pain one month later. Will order ultrasound of left breast to evaluate.   No cp, ct, gu/gi sxs, daily BM, no other pain, edema, sob, or falls.  Past Medical History:  Diagnosis Date  . History of cerclage, currently pregnant 10/29/2015  . Hypothyroid 10/31/2012  . Hypothyroidism   . 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 Kidney stones   . PONV (postoperative nausea and vomiting)   . Preterm labor   . Seasonal allergies   . Supervision of other high-risk pregnancy 10/29/2015    [facility] at     9+3 weeks FOB  Rudell Cobb Dating By  [place] Pap  05/28/15 negative GC/CT Initial:                36+wks: Genetic Screen NT/IT:  CF screen  Anatomic [place]  Flu  vaccine  Tdap Recommended ~ 28wks Glucose Screen  2 hr GBS  Feed Preference  Contraception  Circumcision  Childbirth Classes  Pediatrician    . Thyroid disease    Phreesia 10/28/2019  . UTI (lower urinary tract infection)     Past Surgical History:  Procedure Laterality Date  . CERVICAL CERCLAGE  09/30/2011   Procedure: CERCLAGE CERVICAL;  Surgeon: [name], MD;  Location: [facility];  Service: Gynecology;  Laterality: N/A;  . CERVICAL CERCLAGE N/A 12/01/2012   Procedure: [facility] CERCLAGE CERVICAL;  Surgeon: [name], MD;  Location: [facility];  Service: Gynecology;  Laterality: N/A;  . CERVICAL CERCLAGE N/A 11/25/2015   Procedure: CERCLAGE CERVICAL--McDonald cerclage;  Surgeon: [name], MD;  Location: [facility];  Service: Gynecology;  Laterality: N/A;  . CHOLECYSTECTOMY     laparoscopic- 2011    Family History  Problem Relation Age of Onset  . COPD Mother   . Hypertension Sister   . Cancer Maternal Grandfather        lung  . Diabetes Paternal Grandfather     Social History   Socioeconomic History  . Marital status: Married    Spouse name: Not on file  . Number of children: Not on file  . Years of education: Not on file  . Highest education level:  Not on file  Occupational History  . Not on file  Tobacco Use  . Smoking status: Former Smoker    Packs/day: 0.25    Years: 10.00    Pack years: 2.50    Types: Cigarettes    Quit date: 07/24/2011    Years since quitting: 8.2  . Smokeless tobacco: Never Used  Substance and Sexual Activity  . Alcohol use: No  . Drug use: No  . Sexual activity: Yes    Birth control/protection: None  Other Topics Concern  . Not on file  Social History Narrative  . Not on file   Social Determinants of Health   Financial Resource Strain:   . Difficulty of Paying Living Expenses:   Food Insecurity:   . Worried About Programme researcher, broadcasting/film/video in the Last Year:   . Barista in the Last Year:   Transportation Needs:   . Automotive engineer (Medical):   [name] Lack of Transportation (Non-Medical):   Physical Activity:   . Days of Exercise per Week:   . Minutes of Exercise per Session:   Stress:   . Feeling of Stress :   Social Connections:   . Frequency of Communication with Friends and Family:   . Frequency of Social Gatherings with Friends and Family:   . Attends Religious Services:   . Active Member of Clubs or Organizations:   . Attends Banker Meetings:   [name] Marital Status:   Intimate Partner Violence:   . Fear of Current or Ex-Partner:   . Emotionally Abused:   [name] Physically Abused:   . Sexually Abused:     Outpatient Medications Prior to Visit  Medication Sig Dispense Refill  . Acetaminophen (TYLENOL PO) Take by mouth as needed.    . fluconazole (DIFLUCAN) 150 MG tablet TAKE 1 TABLET BY MOUTH NOW. REPEAT IN 3 DAYS, AND TAKE 1 TABLET MONTHLY BEFORE PERIOD 2 tablet 6  . levothyroxine (SYNTHROID) 137 MCG tablet levothyroxine 137 mcg tablet  Take 1 tablet every day by oral route.    . Multiple Vitamins-Minerals (HAIR SKIN NAILS PO) Take by mouth daily.     No facility-administered medications prior to visit.    Allergies  Allergen Reactions  . Aspirin Anaphylaxis    Headache treated by Goodie powder,then  BC's then regular aspirin over a 12 hour period, had SOB , difficulty breathing , tongue swelling.and rash.   . Bactrim [Sulfamethoxazole-Trimethoprim] Nausea And Vomiting  . Baker's Yeast [Yeast] Hives and Other (See Comments)    Reaction: extreme all over body yeast infections.  . Latex Rash    ROS Review of Systems  All other systems reviewed and are negative.     Objective:    Physical Exam  Constitutional: She is oriented to person, place, and time. Vital signs are normal. She appears well-developed and well-nourished.  HENT:  Head: Normocephalic.  Right Ear: Hearing, tympanic membrane, external ear and ear canal normal.  Left Ear: Hearing, tympanic membrane, external  ear and ear canal normal.  Nose: Nose normal. No mucosal edema, rhinorrhea or septal deviation.  Mouth/Throat: Uvula is midline, oropharynx is clear and moist and mucous membranes are normal. Normal dentition. No dental caries. No oropharyngeal exudate.  Eyes: Pupils are equal, round, and reactive to light. Conjunctivae, EOM and lids are normal. Lids are everted and swept, no foreign bodies found. Right eye exhibits no discharge. Left eye exhibits no discharge. No scleral icterus.  Neck: No JVD present. Carotid  bruit is not present. No tracheal deviation present. No thyromegaly present.  Cardiovascular: Normal rate, regular rhythm, S1 normal, S2 normal, normal heart sounds and normal pulses.  Pulmonary/Chest: Effort normal and breath sounds normal.  Abdominal: Soft. Normal aorta and bowel sounds are normal. There is no hepatosplenomegaly. There is no CVA tenderness.  Musculoskeletal:        General: No tenderness or edema. Normal range of motion.     Cervical back: Full passive range of motion without pain, normal range of motion and neck supple. No edema.  Lymphadenopathy:       Head (right side): No submental, no submandibular, no tonsillar and no occipital adenopathy present.       Head (left side): No submental, no submandibular, no tonsillar and no preauricular adenopathy present.    She has no cervical adenopathy.  Neurological: She is alert and oriented to person, place, and time. No cranial nerve deficit. Coordination normal.  Skin: Skin is warm, dry and intact. No bruising and no rash noted.  Psychiatric: She has a normal mood and affect. Her speech is normal and behavior is normal. Judgment and thought content normal. Cognition and memory are normal.  Nursing note and vitals reviewed.   BP 116/78 (BP Location: Right Arm, Patient Position: Sitting, Cuff Size: Large)   Pulse 88   Temp 97.7 F (36.5 C) (Temporal)   Resp 18   Ht 5\' 3"  (1.6 m)   Wt 214 lb 3.2 oz (97.2 kg)   LMP  10/20/2019   SpO2 97%   BMI 37.94 kg/m  Wt Readings from Last 3 Encounters:  10/30/19 214 lb 3.2 oz (97.2 kg)  07/20/19 218 lb 9.6 oz (99.2 kg)  09/07/18 217 lb (98.4 kg)     Health Maintenance Due  Topic Date Due  . COVID-19 Vaccine (1) Never done  . TETANUS/TDAP  Never done    There are no preventive care reminders to display for this patient.  Lab Results  Component Value Date   TSH 0.57 12/01/2017   Lab Results  Component Value Date   WBC 10.6 11/26/2016   HGB 14.2 11/26/2016   HCT 42.6 11/26/2016   MCV 89.5 11/26/2016   PLT 241 11/26/2016   Lab Results  Component Value Date   NA 142 11/26/2016   K 4.3 11/26/2016   CO2 23 11/26/2016   GLUCOSE 88 11/26/2016   BUN 13 11/26/2016   CREATININE 0.61 11/26/2016   BILITOT 0.5 11/26/2016   ALKPHOS 103 11/26/2016   AST 15 11/26/2016   ALT 22 11/26/2016   PROT 7.2 11/26/2016   ALBUMIN 4.5 11/26/2016   CALCIUM 9.5 11/26/2016   ANIONGAP 5 11/21/2015   Lab Results  Component Value Date   CHOL 159 11/26/2016   Lab Results  Component Value Date   HDL 44 (L) 11/26/2016   Lab Results  Component Value Date   LDLCALC 92 11/26/2016   Lab Results  Component Value Date   TRIG 117 11/26/2016   Lab Results  Component Value Date   CHOLHDL 3.6 11/26/2016   No results found for: HGBA1C    Assessment & Plan:  Drink plenty of water Get plenty of rest Try to get at least 20 minutes of exercise 4 days/week Follow a low-fat, low-cholesterol, low carbohydrate, low sugar diet Blood pressure: 140/90 or less, seek medical attention for out of this range Labs drawn today and results will be called to you with further directions Next time you feel that you have  a vaginal yeast infection make apt for examination (wet prep lab).  Blood pressure stable Problem List Items Addressed This Visit      Endocrine   Hypothyroid   Relevant Orders   TSH   T4, Free    Other Visit Diagnoses    Adult general medical exam    -   Primary   Relevant Orders   COMPLETE METABOLIC PANEL WITH GFR   CBC with Differential/Platelet   Lipid screening       Relevant Orders   Lipid panel   Screening for metabolic disorder       Relevant Orders   COMPLETE METABOLIC PANEL WITH GFR   CBC with Differential/Platelet   Body mass index (BMI) 37.0-37.9, adult       Relevant Orders   COMPLETE METABOLIC PANEL WITH GFR   CBC with Differential/Platelet   Hemoglobin A1c   Encounter for vitamin deficiency screening       Relevant Orders   VITAMIN D 25 Hydroxy (Vit-D Deficiency, Fractures)   Pain of left breast       Relevant Orders   [place] Unlisted Procedure Breast      No orders of the defined types were placed in this encounter.   Follow-up: Return in about 6 months (around 04/30/2020) for Follow-up with labs 1 week prior to your appointment.    Elmore Guise, FNP

## 2019-10-30 NOTE — Patient Instructions (Addendum)
Drink plenty of water Get plenty of rest Try to get at least 20 minutes of exercise 4 days/week Follow a low-fat, low-cholesterol, low carbohydrate, low sugar diet Blood pressure: 140/90 or less, seek medical attention for out of this range Labs drawn today and results will be called to you with further directions We will order ultrasound of left breast related to pain for 1 month Next time you feel that you have a vaginal yeast infection make apt for examination (wet prep lab).

## 2019-10-31 ENCOUNTER — Other Ambulatory Visit: Payer: Self-pay | Admitting: Nurse Practitioner

## 2019-10-31 DIAGNOSIS — E039 Hypothyroidism, unspecified: Secondary | ICD-10-CM

## 2019-10-31 LAB — COMPLETE METABOLIC PANEL WITH GFR
AG Ratio: 1.8 (calc) (ref 1.0–2.5)
ALT: 22 U/L (ref 6–29)
AST: 16 U/L (ref 10–30)
Albumin: 4.6 g/dL (ref 3.6–5.1)
Alkaline phosphatase (APISO): 80 U/L (ref 31–125)
BUN: 13 mg/dL (ref 7–25)
CO2: 26 mmol/L (ref 20–32)
Calcium: 9.3 mg/dL (ref 8.6–10.2)
Chloride: 106 mmol/L (ref 98–110)
Creat: 0.7 mg/dL (ref 0.50–1.10)
GFR, Est African American: 128 mL/min/{1.73_m2} (ref >=60)
GFR, Est Non African American: 111 mL/min/{1.73_m2} (ref >=60)
Globulin: 2.5 g/dL (calc) (ref 1.9–3.7)
Glucose, Bld: 90 mg/dL (ref 65–99)
Potassium: 4.1 mmol/L (ref 3.5–5.3)
Sodium: 143 mmol/L (ref 135–146)
Total Bilirubin: 0.6 mg/dL (ref 0.2–1.2)
Total Protein: 7.1 g/dL (ref 6.1–8.1)

## 2019-10-31 LAB — CBC WITH DIFFERENTIAL/PLATELET
Absolute Monocytes: 590 cells/uL (ref 200–950)
Basophils Absolute: 80 cells/uL (ref 0–200)
Basophils Relative: 0.8 %
Eosinophils Absolute: 150 cells/uL (ref 15–500)
Eosinophils Relative: 1.5 %
HCT: 44.7 % (ref 35.0–45.0)
Hemoglobin: 14.7 g/dL (ref 11.7–15.5)
Lymphs Abs: 2780 cells/uL (ref 850–3900)
MCH: 29.6 pg (ref 27.0–33.0)
MCHC: 32.9 g/dL (ref 32.0–36.0)
MCV: 90.1 fL (ref 80.0–100.0)
MPV: 10.5 fL (ref 7.5–12.5)
Monocytes Relative: 5.9 %
Neutro Abs: 6400 cells/uL (ref 1500–7800)
Neutrophils Relative %: 64 %
Platelets: 290 10*3/uL (ref 140–400)
RBC: 4.96 10*6/uL (ref 3.80–5.10)
RDW: 12.5 % (ref 11.0–15.0)
Total Lymphocyte: 27.8 %
WBC: 10 10*3/uL (ref 3.8–10.8)

## 2019-10-31 LAB — VITAMIN D 25 HYDROXY (VIT D DEFICIENCY, FRACTURES): Vit D, 25-Hydroxy: 58 ng/mL (ref 30–100)

## 2019-10-31 LAB — LIPID PANEL
Cholesterol: 164 mg/dL (ref <200)
HDL: 37 mg/dL — ABNORMAL LOW (ref >=50)
LDL Cholesterol (Calc): 104 mg/dL (calc) — ABNORMAL HIGH
Non-HDL Cholesterol (Calc): 127 mg/dL (calc) (ref <130)
Total CHOL/HDL Ratio: 4.4 (calc) (ref <5.0)
Triglycerides: 134 mg/dL (ref <150)

## 2019-10-31 LAB — HEMOGLOBIN A1C
Hgb A1c MFr Bld: 4.9 % of total Hgb (ref <5.7)
Mean Plasma Glucose: 94 (calc)
eAG (mmol/L): 5.2 (calc)

## 2019-10-31 LAB — TSH: TSH: 0.61 mIU/L

## 2019-10-31 LAB — T4, FREE: Free T4: 1.5 ng/dL (ref 0.8–1.8)

## 2019-10-31 MED ORDER — LEVOTHYROXINE SODIUM 125 MCG PO TABS: 125.0000 ug | ORAL_TABLET | Freq: Every day | ORAL | 0 refills | Status: DC

## 2019-10-31 NOTE — Progress Notes (Signed)
Vitamin D is good  TSH is on lower end I would like her to be closer to 2. Therefore I would like to reduce her Levothyroxine dosage and repeat her labs in 8 weeks from dose change. She is currently taking 137 mcg as reported on her last visit.  her LDL (also called bad cholesterol is a little elevated along with HDL (good cholesterol) on lower end. She could watch labels to reduce bad cholesterol and increase good cholesterol such as in avocados, eggs, extra virgin olive oil.

## 2019-11-02 ENCOUNTER — Other Ambulatory Visit: Payer: Self-pay | Admitting: Family Medicine

## 2019-11-02 DIAGNOSIS — N644 Mastodynia: Secondary | ICD-10-CM

## 2019-11-08 ENCOUNTER — Ambulatory Visit: Payer: BC Managed Care – PPO | Admitting: Obstetrics and Gynecology

## 2019-11-08 ENCOUNTER — Encounter: Payer: Self-pay | Admitting: Obstetrics and Gynecology

## 2019-11-08 VITALS — BP 144/88 | HR 119 | Ht 63.75 in | Wt 212.6 lb

## 2019-11-08 DIAGNOSIS — N644 Mastodynia: Secondary | ICD-10-CM

## 2019-11-08 NOTE — Progress Notes (Signed)
Patient ID: Kimberly Morgan, female   DOB: 11-04-1980, 39 y.o.   MRN: [id number]    [facility] Clinic Visit  @DATE @            Patient name: Kimberly Morgan MRN [id number]  Date of birth: 1981-01-19  CC & HPI:  Kimberly Morgan is a 39 y.o. female presenting today for a second opinion regarding left sided breast pain that began on 10/27/2019. It began to spread to her whole left breast and "felt like it was going to shoot out of her nipple." Her breast also felt very heavy. She denies color change, redness, and discharge. The pain was excruciating 3 days ago and yesterday it did not hurt at all. This morning, she had a few sharp stabbing pains and then it went away. The patient is more comfortable when she is not wearing a bra.  About a month and a half ago, she was re-installing car seats and bumped her chest. She had a bruise on her left breast but it went away. She did not notice any hardness at that time.  Her last period was on 10/20/2019. She has an [place] and mammogram scheduled for 11/21/2019. The patient had COVID-19 at the end of March. The patient denies fever, chills or any other symptoms or complaints at this time. She is accompanied by her husband today.  ROS:  ROS  + left breast pain  + left breast heaviness - skin changes - breast discharge - hardness - fever - chills All systems are negative except as noted in the HPI and PMH.   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   Kidney stones    PONV (postoperative nausea and vomiting)    Preterm labor    Seasonal allergies    Supervision of other high-risk pregnancy 10/29/2015    [facility] at     9+3 weeks FOB  Rudell Cobb Dating By  [place] Pap  05/28/15 negative GC/CT Initial:                36+wks: Genetic Screen NT/IT:   CF screen  Anatomic [place]  Flu vaccine  Tdap Recommended ~ 28wks Glucose Screen  2 hr GBS  Feed Preference  Contraception  Circumcision  Childbirth Classes  Pediatrician     Thyroid disease    Phreesia 10/28/2019   UTI (lower urinary tract infection)                               Surgical Hx:    Past Surgical History:  Procedure Laterality Date   CERVICAL CERCLAGE  09/30/2011   Procedure: CERCLAGE CERVICAL;  Surgeon: [name], MD;  Location: [facility];  Service: Gynecology;  Laterality: N/A;   CERVICAL CERCLAGE N/A 12/01/2012   Procedure: [facility] CERCLAGE CERVICAL;  Surgeon: [name], MD;  Location: [facility];  Service: Gynecology;  Laterality: N/A;   CERVICAL CERCLAGE N/A 11/25/2015   Procedure: CERCLAGE CERVICAL--McDonald cerclage;  Surgeon: [name], MD;  Location: [facility];  Service: Gynecology;  Laterality: N/A;   CHOLECYSTECTOMY     laparoscopic- 2011   Medications: Reviewed & Updated - see associated section  Current Outpatient Medications:    Acetaminophen (TYLENOL PO), Take by mouth as needed., Disp: , Rfl:    levothyroxine (SYNTHROID) 125 MCG tablet, Take 1 tablet (125 mcg total) by mouth daily., Disp: 90 tablet, Rfl: 0   Multiple Vitamins-Minerals (HAIR SKIN NAILS PO), Take by mouth daily., Disp: , Rfl:    fluconazole (DIFLUCAN) 150 MG tablet, TAKE 1 TABLET BY MOUTH NOW. REPEAT IN 3 DAYS, AND TAKE 1 TABLET MONTHLY BEFORE PERIOD (Patient not taking: Reported on 11/08/2019), Disp: 2 tablet, Rfl: 6   Social History: Reviewed -  reports that she quit smoking about 8 years ago. Her smoking use included cigarettes. She has a 2.50 pack-year smoking history. She has never used smokeless tobacco.  Objective Findings:  Vitals: Blood pressure (!) 144/88, pulse (!) 119, height 5' 3.75" (1.619 m), weight 212 lb 9.6 oz (96.4 kg), last menstrual period 10/20/2019.  PHYSICAL EXAMINATION General appearance - alert, well appearing, and in no distress,  oriented to person, place, and time and overweight Mental status - alert, oriented to person, place, and time, normal mood, behavior, speech, dress, motor activity, and thought processes, affect appropriate to mood   Breast: Pendulous breast tissue. Even without retraction, dimpling, or pain. Axilla negative.  PELVIC DEFERRED  Assessment & Plan:   A:  1.  Breast pain, undetermined etiology  P:  1.  Complete US and diagnostic mammogram as scheduled on 11/21/2019.  By signing my name below, I, Pietro Cassis, attest that this documentation has been prepared under the direction and in the presence of [name], MD. Electronically Signed: Pietro Cassis, Medical Scribe. 11/08/19. 2:56 PM.  I personally performed the services described in this documentation, which was SCRIBED in my presence. The recorded information has been reviewed and considered accurate. It has been edited as necessary during review. [name], MD

## 2019-11-20 ENCOUNTER — Other Ambulatory Visit: Payer: Self-pay | Admitting: Women's Health

## 2019-11-20 MED ORDER — FLUCONAZOLE 150 MG PO TABS: ORAL_TABLET | ORAL | 6 refills | Status: DC

## 2019-11-21 ENCOUNTER — Encounter (HOSPITAL_COMMUNITY): Payer: Self-pay

## 2019-11-21 ENCOUNTER — Other Ambulatory Visit: Payer: Self-pay

## 2019-11-21 ENCOUNTER — Ambulatory Visit (HOSPITAL_COMMUNITY)
Admission: RE | Admit: 2019-11-21 | Discharge: 2019-11-21 | Disposition: A | Discharge status: Home / Self Care | Payer: BC Managed Care – PPO | Source: Ambulatory Visit | Attending: Nurse Practitioner | Admitting: Nurse Practitioner

## 2019-11-21 DIAGNOSIS — N644 Mastodynia: Secondary | ICD-10-CM

## 2019-12-26 ENCOUNTER — Other Ambulatory Visit: Payer: BC Managed Care – PPO

## 2020-02-11 ENCOUNTER — Other Ambulatory Visit: Payer: Self-pay | Admitting: Nurse Practitioner

## 2020-02-11 DIAGNOSIS — E039 Hypothyroidism, unspecified: Secondary | ICD-10-CM

## 2020-02-12 ENCOUNTER — Telehealth: Payer: Self-pay | Admitting: Adult Health

## 2020-02-12 MED ORDER — FLUCONAZOLE 100 MG PO TABS
ORAL_TABLET | ORAL | 0 refills | Status: DC
Start: 2020-02-12 — End: 2020-07-22

## 2020-02-12 MED ORDER — METRONIDAZOLE 500 MG PO TABS
500.0000 mg | ORAL_TABLET | Freq: Two times a day (BID) | ORAL | 0 refills | Status: DC
Start: 2020-02-12 — End: 2020-07-22

## 2020-02-12 NOTE — Telephone Encounter (Signed)
Will rx diflucan and flagyl and if not better call to be seen

## 2020-02-12 NOTE — Telephone Encounter (Signed)
Patient called stating that she would like a call back from [facility]. Pt did not state the reason why. Please contact pt

## 2020-02-12 NOTE — Addendum Note (Signed)
Addended by: Cyril Mourning A on: 02/12/2020 01:20 PM   Modules accepted: Orders

## 2020-02-12 NOTE — Telephone Encounter (Addendum)
Pt is "eat up with yeast". Pt took Diflucan on Thursday and yesterday. Has not seen any improvement. Please advise. Thanks!! JSY

## 2020-05-09 ENCOUNTER — Other Ambulatory Visit: Payer: Self-pay | Admitting: Family Medicine

## 2020-05-09 DIAGNOSIS — E039 Hypothyroidism, unspecified: Secondary | ICD-10-CM

## 2020-06-10 ENCOUNTER — Other Ambulatory Visit: Payer: Self-pay | Admitting: Family Medicine

## 2020-06-10 DIAGNOSIS — E039 Hypothyroidism, unspecified: Secondary | ICD-10-CM

## 2020-06-19 ENCOUNTER — Telehealth: Payer: Self-pay

## 2020-06-19 ENCOUNTER — Telehealth: Payer: Self-pay | Admitting: *Deleted

## 2020-06-19 DIAGNOSIS — R928 Other abnormal and inconclusive findings on diagnostic imaging of breast: Secondary | ICD-10-CM

## 2020-06-19 NOTE — Telephone Encounter (Signed)
Scheduled right diagnostic mammogram and [place] 07/09/20 at 2 pm at [facility] be there 15 minutes early. History of right abnormal mammogram in June needs follow up.

## 2020-06-19 NOTE — Telephone Encounter (Signed)
Breast US and diagnostic mammogram @ [facility] 2/15, be there at 1:45 pm. Pt aware. JSY

## 2020-06-19 NOTE — Telephone Encounter (Signed)
Patient called stating she called [facility] and tried to reschedule her mammogram and was told her provider has to call and reschedule it. Is asking for someone to reschedule her mammogram.

## 2020-06-19 NOTE — Telephone Encounter (Signed)
Pt aware of diagnostic and breast US @ [facility], 2/15 be there at 1:45 pm. Pt voiced understanding. JSY

## 2020-07-09 ENCOUNTER — Ambulatory Visit (HOSPITAL_COMMUNITY)
Admission: RE | Admit: 2020-07-09 | Discharge: 2020-07-09 | Disposition: A | Discharge status: Home / Self Care | Payer: BC Managed Care – PPO | Source: Ambulatory Visit | Attending: Adult Health | Admitting: Adult Health

## 2020-07-09 ENCOUNTER — Other Ambulatory Visit: Payer: Self-pay

## 2020-07-09 DIAGNOSIS — R928 Other abnormal and inconclusive findings on diagnostic imaging of breast: Secondary | ICD-10-CM | POA: Diagnosis present

## 2020-07-16 ENCOUNTER — Other Ambulatory Visit: Payer: Self-pay | Admitting: Family Medicine

## 2020-07-16 DIAGNOSIS — E039 Hypothyroidism, unspecified: Secondary | ICD-10-CM

## 2020-07-22 ENCOUNTER — Ambulatory Visit (INDEPENDENT_AMBULATORY_CARE_PROVIDER_SITE_OTHER): Payer: BC Managed Care – PPO | Admitting: Adult Health

## 2020-07-22 ENCOUNTER — Other Ambulatory Visit: Payer: Self-pay

## 2020-07-22 ENCOUNTER — Other Ambulatory Visit (HOSPITAL_COMMUNITY)
Admission: RE | Admit: 2020-07-22 | Discharge: 2020-07-22 | Disposition: A | Discharge status: Home / Self Care | Payer: BC Managed Care – PPO | Source: Ambulatory Visit | Attending: Adult Health | Admitting: Adult Health

## 2020-07-22 ENCOUNTER — Encounter: Payer: Self-pay | Admitting: Adult Health

## 2020-07-22 VITALS — BP 130/90 | HR 93 | Ht 62.25 in | Wt 218.0 lb

## 2020-07-22 DIAGNOSIS — R928 Other abnormal and inconclusive findings on diagnostic imaging of breast: Secondary | ICD-10-CM | POA: Diagnosis not present

## 2020-07-22 DIAGNOSIS — R635 Abnormal weight gain: Secondary | ICD-10-CM | POA: Diagnosis not present

## 2020-07-22 DIAGNOSIS — E039 Hypothyroidism, unspecified: Secondary | ICD-10-CM

## 2020-07-22 DIAGNOSIS — Z01419 Encounter for gynecological examination (general) (routine) without abnormal findings: Secondary | ICD-10-CM | POA: Diagnosis present

## 2020-07-22 MED ORDER — FLUCONAZOLE 150 MG PO TABS: ORAL_TABLET | ORAL | 6 refills | Status: DC

## 2020-07-22 NOTE — Progress Notes (Signed)
Patient ID: Kimberly Morgan, female   DOB: Mar 05, 1981, 40 y.o.   MRN: [id number] History of Present Illness:  Kimberly Morgan is a  40 year old white female, married, [id number], in for a well woman gyn exam and pap, PCP is Dr Jeanice Lim, but they are looking for new one as she is leaving.  Current Medications, Allergies, Past Medical History, Past Surgical History, Family History and Social History were reviewed in [employer] record.     Review of Systems: Patient denies any hearing loss, fatigue, blurred vision, shortness of breath, chest pain, abdominal pain, problems with bowel movements, urination, or intercourse. No joint pain or mood swings. +headaches, +weight gain, +worrier  Periods every 24 days  +yeast before periods    Physical Exam:BP 130/90 (BP Location: Left Arm, Cuff Size: Large)   Pulse 93   Ht 5' 2.25" (1.581 m)   Wt 218 lb (98.9 kg)   LMP 07/15/2020   BMI 39.55 kg/m  General:  Well developed, well nourished, no acute distress Skin:  Warm and dry Neck:  Midline trachea, normal thyroid, good ROM, no lymphadenopathy Lungs; Clear to auscultation bilaterally Breast:  No dominant palpable mass, retraction, or nipple discharge Cardiovascular: Regular rate and rhythm Abdomen:  Soft, non tender, no hepatosplenomegaly Pelvic:  External genitalia is normal in appearance, no lesions.  The vagina is normal in appearance. Urethra has no lesions or masses. The cervix is bulbous.Pap with HRHPV genotyping performed. Uterus is felt to be normal size, shape, and contour.  No adnexal masses or tenderness noted.Bladder is non tender, no masses felt. Extremities/musculoskeletal:  No swelling or varicosities noted, no clubbing or cyanosis Psych:  No mood changes, alert and cooperative,seems happy AA is 0  Fall risk is low PHQ 9 score is 5, no SI, declines meds GAD 7 score is 3  [facility] - 07/22/20 1344      Pregnancy Intention Screening   Does the patient want to become  pregnant in the next year? Ok Either Way    Does the patient's partner want to become pregnant in the next year? Ok Either Way    Would the patient like to discuss contraceptive options today? No      Contraception Wrap Up   Current Method No Method - Other Reason    End Method No Method - Other Reason    Contraception Counseling Provided No         Examination chaperoned by Malachy Mood LPN  Impression and Plan: 1. Encounter for gynecological examination with Papanicolaou smear of cervix Pap sent Physical in 1 year Pap in 3 if normal Refilled diflucan  Meds ordered this encounter  Medications  . fluconazole (DIFLUCAN) 150 MG tablet    Sig: TAKE 1 TABLET BY MOUTH NOW. REPEAT IN 3 DAYS, AND TAKE 1 TABLET MONTHLY BEFORE PERIOD    Dispense:  2 tablet    Refill:  6    Order Specific Question:   Supervising Provider    Answer:   Despina Hidden, LUTHER H [[id number]]     2. Weight gain Check CMP  3. Hypothyroidism, unspecified type Check TSH and free T4 Will talk when labs back and adjust meds then if needed   4. Abnormal right mammogram  Rt diagnostic mammogram and [place] if needed in 6 months

## 2020-07-23 LAB — COMPREHENSIVE METABOLIC PANEL
ALT: 24 IU/L (ref 0–32)
AST: 16 IU/L (ref 0–40)
Albumin/Globulin Ratio: 1.8 (ref 1.2–2.2)
Albumin: 4.8 g/dL (ref 3.8–4.8)
Alkaline Phosphatase: 87 IU/L (ref 44–121)
BUN/Creatinine Ratio: 16 (ref 9–23)
BUN: 10 mg/dL (ref 6–20)
Bilirubin Total: 0.2 mg/dL (ref 0.0–1.2)
CO2: 24 mmol/L (ref 20–29)
Calcium: 9.2 mg/dL (ref 8.7–10.2)
Chloride: 105 mmol/L (ref 96–106)
Creatinine, Ser: 0.62 mg/dL (ref 0.57–1.00)
Globulin, Total: 2.7 g/dL (ref 1.5–4.5)
Glucose: 78 mg/dL (ref 65–99)
Potassium: 4.2 mmol/L (ref 3.5–5.2)
Sodium: 144 mmol/L (ref 134–144)
Total Protein: 7.5 g/dL (ref 6.0–8.5)
eGFR: 117 mL/min/{1.73_m2} (ref >59)

## 2020-07-23 LAB — T4, FREE: Free T4: 1.54 ng/dL (ref 0.82–1.77)

## 2020-07-23 LAB — TSH: TSH: 1.96 u[IU]/mL (ref 0.450–4.500)

## 2020-07-24 ENCOUNTER — Other Ambulatory Visit: Payer: Self-pay | Admitting: Adult Health

## 2020-07-24 DIAGNOSIS — E039 Hypothyroidism, unspecified: Secondary | ICD-10-CM

## 2020-07-24 MED ORDER — LEVOTHYROXINE SODIUM 125 MCG PO TABS: ORAL_TABLET | ORAL | 3 refills | Status: DC

## 2020-07-24 NOTE — Progress Notes (Signed)
Refilled synthroid

## 2020-07-25 LAB — CYTOLOGY - PAP
Comment: NEGATIVE
Diagnosis: NEGATIVE
High risk HPV: NEGATIVE

## 2020-09-20 ENCOUNTER — Telehealth: Payer: Self-pay

## 2020-09-20 NOTE — Telephone Encounter (Signed)
Transition Care Management Unsuccessful Follow-up Telephone Call  Date of discharge and from where:  09/19/2020 from [facility]  Attempts:  1st Attempt  Reason for unsuccessful TCM follow-up call:  Left voice message

## 2020-09-23 NOTE — Telephone Encounter (Signed)
Transition Care Management Follow-up Telephone Call  Date of discharge and from where: 09/19/2020 from [facility]  How have you been since you were released from the hospital? Pt stated that she is feeling a lot better.   Any questions or concerns? No  Items Reviewed:  Did the pt receive and understand the discharge instructions provided? Yes   Medications obtained and verified? Yes   Other? No   Any new allergies since your discharge? No   Dietary orders reviewed? n/a  Do you have support at home? Yes   Functional Questionnaire: (I = Independent and D = Dependent) ADLs: I  Bathing/Dressing- I  Meal Prep- I  Eating- I  Maintaining continence- I  Transferring/Ambulation- I  Managing Meds- I   Follow up appointments reviewed:   PCP Hospital f/u appt confirmed? No    Specialist Hospital f/u appt confirmed? Yes  Allergist in June  Are transportation arrangements needed? No   If their condition worsens, is the pt aware to call PCP or go to the Emergency Dept.? Yes  Was the patient provided with contact information for the PCP's office or ED? Yes  Was to pt encouraged to call back with questions or concerns? Yes

## 2020-12-16 ENCOUNTER — Other Ambulatory Visit: Payer: Self-pay

## 2020-12-16 ENCOUNTER — Other Ambulatory Visit (INDEPENDENT_AMBULATORY_CARE_PROVIDER_SITE_OTHER): Payer: BC Managed Care – PPO

## 2020-12-16 ENCOUNTER — Other Ambulatory Visit (HOSPITAL_COMMUNITY)
Admission: RE | Admit: 2020-12-16 | Discharge: 2020-12-16 | Disposition: A | Discharge status: Home / Self Care | Payer: BC Managed Care – PPO | Source: Ambulatory Visit | Attending: Obstetrics & Gynecology | Admitting: Obstetrics & Gynecology

## 2020-12-16 DIAGNOSIS — N898 Other specified noninflammatory disorders of vagina: Secondary | ICD-10-CM | POA: Diagnosis not present

## 2020-12-16 NOTE — Progress Notes (Signed)
   NURSE VISIT- VAGINITIS/STD/POC  SUBJECTIVE:  Kimberly Morgan is a 40 y.o. [id number] GYN patientfemale here for a vaginal swab for vaginitis screening.  She reports the following symptoms: burning and vulvar itching for 1 week. Denies abnormal vaginal bleeding, significant pelvic pain, fever, or UTI symptoms.  OBJECTIVE:  There were no vitals taken for this visit.  Appears well, in no apparent distress  ASSESSMENT: Vaginal swab for vaginitis screening  PLAN: Self-collected vaginal probe for Bacterial Vaginosis, Yeast sent to lab Treatment: to be determined once results are received Follow-up as needed if symptoms persist/worsen, or new symptoms develop  Henrry Feil A Dawud Mays  12/16/2020 3:15 PM

## 2020-12-18 LAB — CERVICOVAGINAL ANCILLARY ONLY
Bacterial Vaginitis (gardnerella): NEGATIVE
Candida Glabrata: NEGATIVE
Candida Vaginitis: NEGATIVE
Comment: NEGATIVE
Comment: NEGATIVE
Comment: NEGATIVE

## 2021-02-10 ENCOUNTER — Other Ambulatory Visit (HOSPITAL_COMMUNITY): Payer: Self-pay | Admitting: Adult Health

## 2021-02-10 DIAGNOSIS — R928 Other abnormal and inconclusive findings on diagnostic imaging of breast: Secondary | ICD-10-CM

## 2021-02-11 ENCOUNTER — Ambulatory Visit (HOSPITAL_COMMUNITY)
Admission: RE | Admit: 2021-02-11 | Discharge: 2021-02-11 | Disposition: A | Discharge status: Home / Self Care | Payer: BC Managed Care – PPO | Source: Ambulatory Visit | Attending: Adult Health | Admitting: Adult Health

## 2021-02-11 ENCOUNTER — Other Ambulatory Visit: Payer: Self-pay

## 2021-02-11 ENCOUNTER — Encounter (HOSPITAL_COMMUNITY): Payer: Self-pay

## 2021-02-11 DIAGNOSIS — R928 Other abnormal and inconclusive findings on diagnostic imaging of breast: Secondary | ICD-10-CM | POA: Diagnosis not present

## 2021-03-27 ENCOUNTER — Other Ambulatory Visit: Payer: Self-pay | Admitting: Adult Health

## 2021-07-24 ENCOUNTER — Other Ambulatory Visit: Payer: BC Managed Care – PPO | Admitting: Adult Health

## 2021-08-21 ENCOUNTER — Other Ambulatory Visit: Payer: BC Managed Care – PPO | Admitting: Adult Health

## 2021-09-25 ENCOUNTER — Encounter: Payer: Self-pay | Admitting: Adult Health

## 2021-09-25 ENCOUNTER — Ambulatory Visit (INDEPENDENT_AMBULATORY_CARE_PROVIDER_SITE_OTHER): Payer: BC Managed Care – PPO | Admitting: Adult Health

## 2021-09-25 ENCOUNTER — Other Ambulatory Visit (HOSPITAL_COMMUNITY)
Admission: RE | Admit: 2021-09-25 | Discharge: 2021-09-25 | Disposition: A | Discharge status: Home / Self Care | Payer: BC Managed Care – PPO | Source: Ambulatory Visit | Attending: Adult Health | Admitting: Adult Health

## 2021-09-25 VITALS — BP 137/83 | HR 83 | Ht 63.0 in | Wt 226.0 lb

## 2021-09-25 DIAGNOSIS — Z01419 Encounter for gynecological examination (general) (routine) without abnormal findings: Secondary | ICD-10-CM | POA: Insufficient documentation

## 2021-09-25 DIAGNOSIS — Z131 Encounter for screening for diabetes mellitus: Secondary | ICD-10-CM

## 2021-09-25 DIAGNOSIS — E039 Hypothyroidism, unspecified: Secondary | ICD-10-CM

## 2021-09-25 DIAGNOSIS — Z13220 Encounter for screening for lipoid disorders: Secondary | ICD-10-CM | POA: Diagnosis not present

## 2021-09-25 DIAGNOSIS — Z1329 Encounter for screening for other suspected endocrine disorder: Secondary | ICD-10-CM | POA: Insufficient documentation

## 2021-09-25 NOTE — Progress Notes (Signed)
Patient ID: Kimberly Morgan, female   DOB: Sep 24, 1980, 41 y.o.   MRN: [id number] ?History of Present Illness: ?Kimberly Morgan is a 41 year old white female,married, [id number], in for a well woman gyn exam, and she requests a pap. ? ?Lab Results  ?Component Value Date  ? DIAGPAP  07/22/2020  ?  - Negative for intraepithelial lesion or malignancy (NILM)  ? HPV NOT DETECTED 04/13/2018  ? HPVHIGH Negative 07/22/2020  ?  ?PCP is Dr Kimberly Morgan ? ?Current Medications, Allergies, Past Medical History, Past Surgical History, Family History and Social History were reviewed in [employer] record.   ? ? ?Review of Systems: ?Patient denies any headaches, hearing loss, fatigue, blurred vision, shortness of breath, chest pain, abdominal pain, problems with bowel movements, urination, or intercourse. No joint pain or mood swings.  ?Has right ovary pain with ovulation ? ? ? ?Physical Exam:BP 137/83 (BP Location: Left Arm, Patient Position: Sitting, Cuff Size: Large)   Pulse 83   Ht 5\' 3"  (1.6 m)   Wt 226 lb (102.5 kg)   LMP 09/20/2021   BMI 40.03 kg/m?   ?General:  Well developed, well nourished, no acute distress ?Skin:  Warm and dry,tan ?Neck:  Midline trachea, normal thyroid, good ROM, no lymphadenopathy ?Lungs; Clear to auscultation bilaterally ?Breast:  No dominant palpable mass, retraction, or nipple discharge ?Cardiovascular: Regular rate and rhythm ?Abdomen:  Soft, non tender, no hepatosplenomegaly ?Pelvic:  External genitalia is normal in appearance, no lesions.  The vagina is normal in appearance. Urethra has no lesions or masses. The cervix is bulbous. Pap with HR HPV genotyping performed. Uterus is felt to be normal size, shape, and contour.  No adnexal masses or tenderness noted.Bladder is non tender, no masses felt. ?Extremities/musculoskeletal:  No swelling or varicosities noted, no clubbing or cyanosis ?Psych:  No mood changes, alert and cooperative,seems happy ?AA is 0 ?Fall risk is moderate ? ?   09/25/2021  ?  1:48 PM 07/22/2020  ?  1:47 PM 10/30/2019  ?  9:21 AM  ?Depression screen PHQ 2/9  ?Decreased Interest 0 0 0  ?Down, Depressed, Hopeless 1 0 0  ?PHQ - 2 Score 1 0 0  ?Altered sleeping 0 1   ?Tired, decreased energy 2 2   ?Change in appetite 1 2   ?Feeling bad or failure about yourself  1 0   ?Trouble concentrating 0 0   ?Moving slowly or fidgety/restless 0 0   ?Suicidal thoughts 0 0   ?PHQ-9 Score 5 5   ?  ? ?  09/25/2021  ?  1:50 PM 07/22/2020  ?  1:47 PM  ?GAD 7 : Generalized Anxiety Score  ?Nervous, Anxious, on Edge 1 1  ?Control/stop worrying 1 1  ?Worry too much - different things 1 1  ?Trouble relaxing 0 0  ?Restless 0 0  ?Easily annoyed or irritable 0 0  ?Afraid - awful might happen 1 0  ?Total GAD 7 Score 4 3  ? ? Upstream - 09/25/21 1345   ? ?  ? Pregnancy Intention Screening  ? Does the patient want to become pregnant in the next year? Ok Either Way   ? Does the patient's partner want to become pregnant in the next year? Ok Either Way   ? Would the patient like to discuss contraceptive options today? No   ?  ? Contraception Wrap Up  ? Current Method No Method - Other Reason   ? End Method No Method - Other Reason   ? ?  ?  ? ?  ?  Examination chaperoned by Malachy Mood LPN ? ?  ? ?Impression and Plan: ?1. Encounter for gynecological examination with Papanicolaou smear of cervix ?Pap sent  ?Physical in 1 year ?Pap in 3 years if normal ?Will check labs  ?- CBC ?- Comprehensive metabolic panel ?- Lipid panel ?- Cytology - PAP( [place]) ? ?2. Hypothyroidism, unspecified type ?- TSH ?- T4, free ?She is on synthroid 125 mcg, and has meds til labs back ? ?3. Screening cholesterol level ?- Lipid panel ? ?4. Screening for diabetes mellitus ?- Hemoglobin A1c  ? ? ? ?  ?  ?

## 2021-09-26 LAB — COMPREHENSIVE METABOLIC PANEL
ALT: 30 IU/L (ref 0–32)
AST: 19 IU/L (ref 0–40)
Albumin/Globulin Ratio: 1.5 (ref 1.2–2.2)
Albumin: 4.3 g/dL (ref 3.8–4.8)
Alkaline Phosphatase: 103 IU/L (ref 44–121)
BUN/Creatinine Ratio: 18 (ref 9–23)
BUN: 10 mg/dL (ref 6–20)
Bilirubin Total: <0.2 mg/dL (ref 0.0–1.2)
CO2: 22 mmol/L (ref 20–29)
Calcium: 9.1 mg/dL (ref 8.7–10.2)
Chloride: 104 mmol/L (ref 96–106)
Creatinine, Ser: 0.57 mg/dL (ref 0.57–1.00)
Globulin, Total: 2.8 g/dL (ref 1.5–4.5)
Glucose: 83 mg/dL (ref 70–99)
Potassium: 4.3 mmol/L (ref 3.5–5.2)
Sodium: 143 mmol/L (ref 134–144)
Total Protein: 7.1 g/dL (ref 6.0–8.5)
eGFR: 118 mL/min/{1.73_m2} (ref >59)

## 2021-09-26 LAB — T4, FREE: Free T4: 1.46 ng/dL (ref 0.82–1.77)

## 2021-09-26 LAB — LIPID PANEL
Chol/HDL Ratio: 3.9 ratio (ref 0.0–4.4)
Cholesterol, Total: 146 mg/dL (ref 100–199)
HDL: 37 mg/dL — ABNORMAL LOW (ref >39)
LDL Chol Calc (NIH): 75 mg/dL (ref 0–99)
Triglycerides: 206 mg/dL — ABNORMAL HIGH (ref 0–149)
VLDL Cholesterol Cal: 34 mg/dL (ref 5–40)

## 2021-09-26 LAB — TSH: TSH: 0.271 u[IU]/mL — ABNORMAL LOW (ref 0.450–4.500)

## 2021-09-26 LAB — CBC
Hematocrit: 38.6 % (ref 34.0–46.6)
Hemoglobin: 12.5 g/dL (ref 11.1–15.9)
MCH: 26.9 pg (ref 26.6–33.0)
MCHC: 32.4 g/dL (ref 31.5–35.7)
MCV: 83 fL (ref 79–97)
Platelets: 343 10*3/uL (ref 150–450)
RBC: 4.65 x10E6/uL (ref 3.77–5.28)
RDW: 14.4 % (ref 11.7–15.4)
WBC: 10.6 10*3/uL (ref 3.4–10.8)

## 2021-09-26 LAB — HEMOGLOBIN A1C
Est. average glucose Bld gHb Est-mCnc: 105 mg/dL
Hgb A1c MFr Bld: 5.3 % (ref 4.8–5.6)

## 2021-09-29 LAB — CYTOLOGY - PAP
Comment: NEGATIVE
Diagnosis: NEGATIVE
High risk HPV: NEGATIVE

## 2021-10-10 ENCOUNTER — Other Ambulatory Visit: Payer: Self-pay | Admitting: *Deleted

## 2021-10-10 MED ORDER — FLUCONAZOLE 150 MG PO TABS: ORAL_TABLET | ORAL | 6 refills | Status: DC

## 2022-03-23 ENCOUNTER — Other Ambulatory Visit (HOSPITAL_COMMUNITY): Payer: Self-pay | Admitting: Adult Health

## 2022-03-23 DIAGNOSIS — N6489 Other specified disorders of breast: Secondary | ICD-10-CM

## 2022-03-23 DIAGNOSIS — R928 Other abnormal and inconclusive findings on diagnostic imaging of breast: Secondary | ICD-10-CM

## 2022-04-07 ENCOUNTER — Ambulatory Visit (HOSPITAL_COMMUNITY)
Admission: RE | Admit: 2022-04-07 | Discharge: 2022-04-07 | Disposition: A | Discharge status: Home / Self Care | Payer: BC Managed Care – PPO | Source: Ambulatory Visit | Attending: Adult Health | Admitting: Adult Health

## 2022-04-07 ENCOUNTER — Encounter (HOSPITAL_COMMUNITY): Payer: Self-pay

## 2022-04-07 DIAGNOSIS — N6489 Other specified disorders of breast: Secondary | ICD-10-CM

## 2022-07-29 ENCOUNTER — Encounter: Payer: Self-pay | Admitting: Adult Health

## 2022-07-29 ENCOUNTER — Ambulatory Visit: Payer: BC Managed Care – PPO | Admitting: Adult Health

## 2022-07-29 VITALS — BP 139/84 | HR 92 | Ht 63.0 in | Wt 223.0 lb

## 2022-07-29 DIAGNOSIS — N898 Other specified noninflammatory disorders of vagina: Secondary | ICD-10-CM

## 2022-07-29 DIAGNOSIS — N940 Mittelschmerz: Secondary | ICD-10-CM | POA: Diagnosis not present

## 2022-07-29 DIAGNOSIS — N926 Irregular menstruation, unspecified: Secondary | ICD-10-CM | POA: Insufficient documentation

## 2022-07-29 DIAGNOSIS — E039 Hypothyroidism, unspecified: Secondary | ICD-10-CM

## 2022-07-29 DIAGNOSIS — R61 Generalized hyperhidrosis: Secondary | ICD-10-CM | POA: Insufficient documentation

## 2022-07-29 DIAGNOSIS — G479 Sleep disorder, unspecified: Secondary | ICD-10-CM | POA: Insufficient documentation

## 2022-07-29 DIAGNOSIS — Z3202 Encounter for pregnancy test, result negative: Secondary | ICD-10-CM | POA: Diagnosis not present

## 2022-07-29 LAB — POCT WET PREP (WET MOUNT)
Clue Cells Wet Prep Whiff POC: NEGATIVE
WBC, Wet Prep HPF POC: POSITIVE

## 2022-07-29 LAB — POCT URINE PREGNANCY: Preg Test, Ur: NEGATIVE

## 2022-07-29 MED ORDER — FLUCONAZOLE 150 MG PO TABS: ORAL_TABLET | ORAL | 6 refills | Status: DC

## 2022-07-29 NOTE — Progress Notes (Signed)
Subjective:     Patient ID: Wendall Papa, female   DOB: [birth year], 42 y.o.   MRN: [id number]  HPI Meyer is a 42 year old white female, married, [id number] in complaining of irregular periods and bleeding, bas night sweats, feels bloated and does not sleep well and has noticed increased hair growth. She has had ovulation pain  and when on right hurts really bad and has nausea, was so bad in December had CT at [facility]  and was normal.  She had to have IVF to get pregnant.   Last pap was negative HPV,NILM 09/25/21  PCP is DR Hasanaj.  Review of Systems + irregular periods and bleeding, bas night sweats, feels bloated and does not sleep well and has noticed increased hair growth. She has had ovulation pain  and when on right hurts really bad and has nausea, was so bad in December had CT at [facility]  and was normal Has vaginal itching today  Denies pain with sex or Bms    Reviewed past medical,surgical, social and family history. Reviewed medications and allergies.  Objective:   Physical Exam BP 139/84 (BP Location: Left Arm, Patient Position: Sitting, Cuff Size: Large)   Pulse 92   Ht 5\' 3"  (1.6 m)   Wt 223 lb (101.2 kg)   LMP 07/27/2022   BMI 39.50 kg/m  UPT is negative. Skin warm and dry.Pelvic: external genitalia is normal in appearance no lesions, vagina:+period blood,urethra has no lesions or masses noted, cervix:smooth and bulbous, uterus: normal size, shape and contour, non tender, no masses felt, adnexa: no masses or tenderness noted. Bladder is non tender and no masses felt. Wet prep: + RBCs and +WBCs.  Fall risk is low  Upstream - 07/29/22 1525       Pregnancy Intention Screening   Does the patient want to become pregnant in the next year? Ok Either Way    Does the patient's partner want to become pregnant in the next year? No    Would the patient like to discuss contraceptive options today? No      Contraception Wrap Up   Current Method No Method - Other  Reason    End Method No Method - Other Reason             Examination chaperoned by Gladys Damme NP student     Assessment:     1. Pregnancy examination or test, negative result   2. Irregular periods/menstrual cycles Periods not regular Check FSH  3. Ovulation pain CY was negative 04/27/22 at [facility]   4. Vaginal itching Will rx diflucan  Meds ordered this encounter  Medications   fluconazole (DIFLUCAN) 150 MG tablet    Sig: TAKE 1 TABLET BY MOUTH NOW. REPEAT IN 3 DAYS, AND TAKE 1 TABLET MONTHLY BEFORE PERIOD.    Dispense:  2 tablet    Refill:  6    Order Specific Question:   Supervising Provider    Answer:   Despina Hidden, LUTHER H [[id number]]     5. Night sweats Will check FSH  6. Sleep disturbance   7. Hypothyroidism, unspecified type Check TSH and free T4    Plan:     Follow up prn

## 2022-07-30 LAB — FOLLICLE STIMULATING HORMONE: FSH: 11.7 m[IU]/mL

## 2022-07-30 LAB — TSH+FREE T4
Free T4: 1.69 ng/dL (ref 0.82–1.77)
TSH: 0.423 u[IU]/mL — ABNORMAL LOW (ref 0.450–4.500)

## 2022-09-30 ENCOUNTER — Encounter (INDEPENDENT_AMBULATORY_CARE_PROVIDER_SITE_OTHER): Payer: Self-pay | Admitting: *Deleted

## 2022-10-28 ENCOUNTER — Encounter: Payer: Self-pay | Admitting: Adult Health

## 2022-10-28 ENCOUNTER — Other Ambulatory Visit (HOSPITAL_COMMUNITY)
Admission: RE | Admit: 2022-10-28 | Discharge: 2022-10-28 | Disposition: A | Discharge status: Home / Self Care | Payer: BC Managed Care – PPO | Source: Ambulatory Visit | Attending: Adult Health | Admitting: Adult Health

## 2022-10-28 ENCOUNTER — Ambulatory Visit (INDEPENDENT_AMBULATORY_CARE_PROVIDER_SITE_OTHER): Payer: BC Managed Care – PPO | Admitting: Adult Health

## 2022-10-28 VITALS — BP 135/87 | HR 83 | Ht 63.0 in | Wt 218.5 lb

## 2022-10-28 DIAGNOSIS — Z1211 Encounter for screening for malignant neoplasm of colon: Secondary | ICD-10-CM

## 2022-10-28 DIAGNOSIS — N926 Irregular menstruation, unspecified: Secondary | ICD-10-CM

## 2022-10-28 DIAGNOSIS — R350 Frequency of micturition: Secondary | ICD-10-CM | POA: Diagnosis not present

## 2022-10-28 DIAGNOSIS — Z01419 Encounter for gynecological examination (general) (routine) without abnormal findings: Secondary | ICD-10-CM | POA: Insufficient documentation

## 2022-10-28 DIAGNOSIS — E039 Hypothyroidism, unspecified: Secondary | ICD-10-CM

## 2022-10-28 DIAGNOSIS — K649 Unspecified hemorrhoids: Secondary | ICD-10-CM

## 2022-10-28 LAB — POCT URINALYSIS DIPSTICK
Blood, UA: NEGATIVE
Glucose, UA: NEGATIVE
Ketones, UA: NEGATIVE
Leukocytes, UA: NEGATIVE
Nitrite, UA: NEGATIVE
Protein, UA: NEGATIVE

## 2022-10-28 LAB — HEMOCCULT GUIAC POC 1CARD (OFFICE): Fecal Occult Blood, POC: NEGATIVE

## 2022-10-28 MED ORDER — LEVOTHYROXINE SODIUM 125 MCG PO TABS: ORAL_TABLET | ORAL | 3 refills | Status: DC

## 2022-10-28 MED ORDER — FLUCONAZOLE 150 MG PO TABS: ORAL_TABLET | ORAL | 6 refills | Status: DC

## 2022-10-28 NOTE — Progress Notes (Signed)
Patient ID: Kimberly Morgan, female   DOB: [birth year], 42 y.o.   MRN: [id number] History of Present Illness: Kimberly Morgan is a 42 year old white female,married, [id number], in for a well woman gyn exam and pap. Periods are shorter and more frequent, still gets hot, but sleeping better. Has rectal fissure now, seeing GI in July. Has some urinary frequency.  PCP is Dr Olena Leatherwood   Current Medications, Allergies, Past Medical History, Past Surgical History, Family History and Social History were reviewed in [employer] electronic medical record.     Review of Systems: Patient denies any headaches, hearing loss, fatigue, blurred vision, shortness of breath, chest pain, abdominal pain, problems with bowel movements, urination, or intercourse. No joint pain or mood swings.  See HPI for positives.  Physical Exam:BP 135/87 (BP Location: Right Arm, Patient Position: Sitting, Cuff Size: Normal)   Pulse 83   Ht 5\' 3"  (1.6 m)   Wt 218 lb 8 oz (99.1 kg)   LMP 10/09/2022   BMI 38.71 kg/m  urine dipstick was negative General:  Well developed, well nourished, no acute distress Skin:  Warm and dry,tan Neck:  Midline trachea, normal thyroid, good ROM, no lymphadenopathy Lungs; Clear to auscultation bilaterally Breast:  No dominant palpable mass, retraction, or nipple discharge Cardiovascular: Regular rate and rhythm Abdomen:  Soft, non tender, no hepatosplenomegaly Pelvic:  External genitalia is normal in appearance, no lesions.  The vagina is normal in appearance. Urethra has no lesions or masses. The cervix is bulbous, and smooth, pap with HR HPV genotyping performed.  Uterus is felt to be normal size, shape, and contour.  No adnexal masses or tenderness noted.Bladder is non tender, no masses felt. Rectal: Good sphincter tone, but did not push, due to burning with ?fissure,  +internal and external hemorrhoids felt.  Hemoccult negative. Extremities/musculoskeletal:  No swelling or varicosities noted, no  clubbing or cyanosis Psych:  No mood changes, alert and cooperative,seems happy AA is 1 Fall risk is low    10/28/2022   11:32 AM 09/25/2021    1:48 PM 07/22/2020    1:47 PM  Depression screen PHQ 2/9  Decreased Interest 0 0 0  Down, Depressed, Hopeless 1 1 0  PHQ - 2 Score 1 1 0  Altered sleeping 2 0 1  Tired, decreased energy 1 2 2   Change in appetite 1 1 2   Feeling bad or failure about yourself  0 1 0  Trouble concentrating 0 0 0  Moving slowly or fidgety/restless 0 0 0  Suicidal thoughts 0 0 0  PHQ-9 Score 5 5 5        10/28/2022   11:33 AM 09/25/2021    1:50 PM 07/22/2020    1:47 PM  GAD 7 : Generalized Anxiety Score  Nervous, Anxious, on Edge 1 1 1   Control/stop worrying 1 1 1   Worry too much - different things 1 1 1   Trouble relaxing 1 0 0  Restless 0 0 0  Easily annoyed or irritable 0 0 0  Afraid - awful might happen 1 1 0  Total GAD 7 Score 5 4 3       Upstream - 10/28/22 1144       Pregnancy Intention Screening   Does the patient want to become pregnant in the next year? Ok Either Way    Does the patient's partner want to become pregnant in the next year? Ok Either Way    Would the patient like to discuss contraceptive options today? No  Contraception Wrap Up   Current Method No Method - Other Reason    Reason for No Current Contraceptive Method at Intake ([facility] Only) Other    End Method No Method - Other Reason            Examination chaperoned by Malachy Mood LPN   Impression and Plan: 1. Encounter for gynecological examination with Papanicolaou smear of cervix Pap sent Physical in in 1 year Get mammogram in November  - Cytology - PAP( [place])  2. Urinary frequency - POCT Urinalysis Dipstick  3. Encounter for screening fecal occult blood testing Hemoccult was negative  - POCT occult blood stool  4. Irregular periods/menstrual cycles Periods shorter and lighter   5. Hypothyroidism, unspecified type Will refill synthroid  -  levothyroxine (SYNTHROID) 125 MCG tablet; TAKE 1 TABLET(125 MCG) BY MOUTH DAILY  Dispense: 90 tablet; Refill: 3  6. Hemorrhoids, unspecified hemorrhoid type Has ?fissure per PCP She is using supp. And nitroglycerin ointment and lidocaine gel Sees GI in July Get doughnut pillow Try Aquaphor 3 N 1 cream   Try frozen pills to cool it down

## 2022-11-02 LAB — CYTOLOGY - PAP
Adequacy: ABSENT
Comment: NEGATIVE
Diagnosis: NEGATIVE
High risk HPV: NEGATIVE

## 2022-11-30 ENCOUNTER — Encounter (INDEPENDENT_AMBULATORY_CARE_PROVIDER_SITE_OTHER): Payer: Self-pay | Admitting: Gastroenterology

## 2022-11-30 ENCOUNTER — Ambulatory Visit (INDEPENDENT_AMBULATORY_CARE_PROVIDER_SITE_OTHER): Payer: BC Managed Care – PPO | Admitting: Gastroenterology

## 2022-11-30 VITALS — BP 145/97 | HR 92 | Temp 98.9°F | Ht 63.0 in | Wt 219.6 lb

## 2022-11-30 DIAGNOSIS — K641 Second degree hemorrhoids: Secondary | ICD-10-CM

## 2022-11-30 DIAGNOSIS — K602 Anal fissure, unspecified: Secondary | ICD-10-CM | POA: Diagnosis not present

## 2022-11-30 DIAGNOSIS — K9089 Other intestinal malabsorption: Secondary | ICD-10-CM | POA: Insufficient documentation

## 2022-11-30 DIAGNOSIS — K6289 Other specified diseases of anus and rectum: Secondary | ICD-10-CM | POA: Insufficient documentation

## 2022-11-30 MED ORDER — CLOTRIMAZOLE 1 % EX SOLN: 1.0000 | Freq: Two times a day (BID) | CUTANEOUS | 0 refills | Status: DC

## 2022-11-30 MED ORDER — DILTIAZEM GEL 2 %: 1.0000 | Freq: Two times a day (BID) | CUTANEOUS | 2 refills | Status: DC

## 2022-11-30 NOTE — Progress Notes (Signed)
Katrinka Blazing, M.D. Gastroenterology & Hepatology [facility] Gastroenterology [address] [place], [place] [address] Primary Care Physician: Toma Deiters, MD [address] [place] [address]  Referring MD: PCP  Chief Complaint:  rectal pain and bleeding.  History of Present Illness: Kimberly Morgan is a 42 y.o. female with past medical history of hypothyroidism, who presents for evaluation of rectal pain and bleeding.  Patient reports that in August 07 2022 she had a bloody bowel movement, which lasted for a day, characterized by fresh blood. She reports that since then she has presented recurrent episodes of throbbing pain and burning sensation, which lasts for a few hours.  She tried the nitroglycerin compound which provided some relief for a few days but the pain came back. She has used multiple agents - has tried Preparation H, lidocaine cream. Used diltiazem for a week with relief but she ran out of the medication as it was her husbands.  Patient reports that after she had her cholecystectomy, her stool are usually watery and yellowish. She has usually 4-5 bowel movements per day. Has had 2 accidents in her lifetime.  Most recent blood workup from 09/25/2021 showed normal CMP, CBC and borderline TSH of 0.27.  The patient denies having any nausea, vomiting, fever, chills, hematochezia, melena, hematemesis, abdominal distention, abdominal pain, diarrhea, jaundice, pruritus or weight loss.  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   Kidney stones    PONV (postoperative nausea and vomiting)    Preterm labor    Seasonal allergies    Supervision of other high-risk pregnancy 10/29/2015    [facility] Initiated Care at     9+3 weeks FOB  Rudell Cobb Dating By  [place] Pap  05/28/15 negative GC/CT Initial:                 36+wks: Genetic Screen NT/IT:  CF screen  Anatomic [place]  Flu vaccine  Tdap Recommended ~ 28wks Glucose Screen  2 hr GBS  Feed Preference  Contraception  Circumcision  Childbirth Classes  Pediatrician     Thyroid disease    Phreesia 10/28/2019   UTI (lower urinary tract infection)     Past Surgical History: Past Surgical History:  Procedure Laterality Date   CERVICAL CERCLAGE  09/30/2011   Procedure: CERCLAGE CERVICAL;  Surgeon: [name], MD;  Location: [facility];  Service: Gynecology;  Laterality: N/A;   CERVICAL CERCLAGE N/A 12/01/2012   Procedure: [facility] CERCLAGE CERVICAL;  Surgeon: [name], MD;  Location: [facility];  Service: Gynecology;  Laterality: N/A;   CERVICAL CERCLAGE N/A 11/25/2015   Procedure: CERCLAGE CERVICAL--[place] cerclage;  Surgeon: [name], MD;  Location: [facility];  Service: Gynecology;  Laterality: N/A;   CHOLECYSTECTOMY     laparoscopic- 2011    Family History: Family History  Problem Relation Age of Onset   Diabetes Paternal Grandfather    Cancer Maternal Grandfather        lung   COPD Mother    Kidney disease Mother    Hypertension Sister     Social History: Social History   Tobacco Use  Smoking Status Former   Packs/day: 0.25   Years: 10.00   Additional pack years: 0.00   Total pack years: 2.50   Types: Cigarettes   Quit date: 07/24/2011   Years since  quitting: 11.3   Passive exposure: Current  Smokeless Tobacco Never   Social History   Substance and Sexual Activity  Alcohol Use Yes   Social History   Substance and Sexual Activity  Drug Use No    Allergies: Allergies  Allergen Reactions   Aspirin Anaphylaxis    Headache treated by Goodie powder,then  BC's then regular aspirin over a 12 hour period, had SOB , difficulty breathing , tongue swelling.and rash.    Bactrim [Sulfamethoxazole-Trimethoprim] Nausea And Vomiting   Baker's Yeast [Yeast] Hives and Other (See Comments)    Reaction: extreme all over body  yeast infections.   Latex Rash    Medications: Current Outpatient Medications  Medication Sig Dispense Refill   Acetaminophen (TYLENOL PO) Take by mouth as needed.     Cyanocobalamin (VITAMIN B-12 PO) Take by mouth.     EPINEPHrine 0.3 mg/0.3 mL IJ SOAJ injection See admin instructions.     fluconazole (DIFLUCAN) 150 MG tablet TAKE 1 TABLET BY MOUTH NOW. REPEAT IN 3 DAYS, AND TAKE 1 TABLET MONTHLY BEFORE PERIOD. 2 tablet 6   levothyroxine (SYNTHROID) 125 MCG tablet TAKE 1 TABLET(125 MCG) BY MOUTH DAILY 90 tablet 3   Nitroglycerin 0.4 % OINT SMARTSIG:1 Inch(es) Rectally Twice Daily     VITAMIN D PO Take by mouth. 5,000 one daily     No current facility-administered medications for this visit.    Review of Systems: GENERAL: negative for malaise, night sweats HEENT: No changes in hearing or vision, no nose bleeds or other nasal problems. NECK: Negative for lumps, goiter, pain and significant neck swelling RESPIRATORY: Negative for cough, wheezing CARDIOVASCULAR: Negative for chest pain, leg swelling, palpitations, orthopnea GI: SEE HPI MUSCULOSKELETAL: Negative for joint pain or swelling, back pain, and muscle pain. SKIN: Negative for lesions, rash PSYCH: Negative for sleep disturbance, mood disorder and recent psychosocial stressors. HEMATOLOGY Negative for prolonged bleeding, bruising easily, and swollen nodes. ENDOCRINE: Negative for cold or heat intolerance, polyuria, polydipsia and goiter. NEURO: negative for tremor, gait imbalance, syncope and seizures. The remainder of the review of systems is noncontributory.   Physical Exam: BP (!) 145/97   Pulse 92   Temp 98.9 F (37.2 C) (Oral)   Ht 5\' 3"  (1.6 m)   Wt 219 lb 9.6 oz (99.6 kg)   BMI 38.90 kg/m  GENERAL: The patient is AO x3, in no acute distress. HEENT: Head is normocephalic and atraumatic. EOMI are intact. Mouth is well hydrated and without lesions. NECK: Supple. No masses LUNGS: Clear to auscultation. No presence  of rhonchi/wheezing/rales. Adequate chest expansion HEART: RRR, normal s1 and s2. ABDOMEN: Soft, nontender, no guarding, no peritoneal signs, and nondistended. BS +. No masses. RECTAL EXAM: presence of non thrombosed external hemorrhoids and presence of a very small fissure at 3 position, normal tone, boggy mucosa on DRE with presence of tenderness at 3 position, no masses, brown stool without blood. Chaperone: Joanette Gula, CMA EXTREMITIES: Without any cyanosis, clubbing, rash, lesions or edema. NEUROLOGIC: AOx3, no focal motor deficit. SKIN: no jaundice, no rashes   Imaging/Labs: as above  I personally reviewed and interpreted the available labs, imaging and endoscopic files.  Impression and Plan: Kimberly Morgan is a 42 y.o. female with past medical history of hypothyroidism, who presents for evaluation of rectal pain and bleeding.  The patient has presented recurrent episodes of rectal pain with 1 isolated episode of rectal bleeding.  She has had significant dyschezia.  Physical exam today showed presence of small fissure but  significant tenderness on DRE when palpating what seems to be an internal hemorrhoid.  Given her significant pain, I consider it would be important to evaluate this further with a flexible sigmoidoscopy.  However she will be provided further symptom relief with a combination of antifungal and diltiazem.  I also gave her recommendations to avoid further exacerbation of her internal and external hemorrhoid pain.  Ultimately, if her symptoms persist, could consider performing internal hemorrhoidal banding.  The patient is also presenting chronic issues with diarrhea which seem to be related to her cholecystectomy.  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  We will discuss this further in next appointment.  - Start diltiazem rectal cream twice a day for 2 months - Start  topical clotrimazole twice a day for 1 month - Do no strain to move bowels - Use bath sitz regularly - Can continue using lidocaine suppositories for now - Schedule flexible sigmoidoscopy  All questions were answered.      Katrinka Blazing, MD Gastroenterology and Hepatology [facility] Gastroenterology

## 2022-11-30 NOTE — Patient Instructions (Addendum)
Start diltiazem rectal cream twice a day for 2 months Start topical clotrimazole twice a day for 1 month Do no strain to move bowels Use bath sitz regularly Can continue using lidocaine suppositories for now Schedule flexible sigmoidoscopy

## 2022-12-01 ENCOUNTER — Telehealth (INDEPENDENT_AMBULATORY_CARE_PROVIDER_SITE_OTHER): Payer: Self-pay | Admitting: Gastroenterology

## 2022-12-01 NOTE — Telephone Encounter (Signed)
Pt contacted with pre op appt information Pre op in person 12/09/22 at 2:45pm

## 2022-12-08 ENCOUNTER — Telehealth (INDEPENDENT_AMBULATORY_CARE_PROVIDER_SITE_OTHER): Payer: Self-pay | Admitting: Gastroenterology

## 2022-12-08 NOTE — Telephone Encounter (Addendum)
Pt left message stating that hospital called her in regards to flex sig on Friday. Insurance will not cover procedure unless it is coded as preventative. Pt came in for rectal pain-from my understanding that has to be diagnostic. Pt states she would have to pay $1900 up front if not coded as preventative.   CPT [phone]- CPT invalid on [employer] website ([place] site for [facility])  Pt states that the $1900 is coming from the hospital portion, but if flex sig is coded as preventative it would be 100% covered.  Please advise. Thank you.

## 2022-12-08 NOTE — Patient Instructions (Signed)
Kimberly Morgan  12/08/2022     @PREFPERIOPPHARMACY @   Your procedure is scheduled on  12/11/2022.   Report to [facility] at  1200  P.M.   Call this number if you have problems the morning of surgery:  [phone]  If you experience any cold or flu symptoms such as cough, fever, chills, shortness of breath, etc. between now and your scheduled surgery, please notify us at the above number.   Remember:  Follow the diet and prep instructions given to you by the office.     Take these medicines the morning of surgery with A SIP OF WATER                                         levothyroxine.    Do not wear jewelry, make-up or nail polish, including gel polish,  artificial nails, or any other type of covering on natural nails (fingers and  toes).  Do not wear lotions, powders, or perfumes, or deodorant.  Do not shave 48 hours prior to surgery.  Men may shave face and neck.  Do not bring valuables to the hospital.  [facility] is not responsible for any belongings or valuables.  Contacts, dentures or bridgework may not be worn into surgery.  Leave your suitcase in the car.  After surgery it may be brought to your room.  For patients admitted to the hospital, discharge time will be determined by your treatment team.  Patients discharged the day of surgery will not be allowed to drive home and must have someone with them for 24 hours.    Special instructions:   DO NOT smoke tobacco or vape for 24 hours before your procedure.  Please read over the following fact sheets that you were given. Anesthesia Post-op Instructions and Care and Recovery After Surgery      Flexible Sigmoidoscopy, Care After The following information offers guidance on how to care for yourself after your procedure. Your health care provider may also give you more specific instructions. If you have problems or questions, contact your health care provider. What can I expect after the procedure? After  the procedure, it is common to have: Cramping or pain in your abdomen. Bloating. A small amount of blood with your stool. This may happen if a sample of tissue was removed for testing (biopsy). Follow these instructions at home: Eating and drinking Drink enough fluid to keep your urine pale yellow. Follow instructions from your health care provider about what you may eat and drink. Go back to your normal diet as told by your health care provider. Avoid heavy or fried foods. These may be hard to digest. Activity  If you were given a sedative during the procedure, it can affect you for several hours. Do not drive or operate machinery until your health care provider says that it is safe. Rest as told by your health care provider. Do not sit for a long time without moving. Get up to take short walks every 1-2 hours. This will improve blood flow and breathing. Ask for help if you feel weak or unsteady. Return to your normal activities as told by your health care provider. Ask your health care provider what activities are safe for you. General instructions Take over-the-counter and prescription medicines only as told by your health care provider. Try walking around when you have cramps  or feel bloated. Contact a health care provider if: You have pain or cramping in your abdomen that gets worse or is not helped with medicine. You have a small amount of bleeding from your rectum for more than 24 hours after your procedure. You have nausea or vomiting. You feel weak or dizzy. You have a fever. Get help right away if: You pass large blood clots or see a large amount of blood in the toilet after having a bowel movement. You have severe pain in your abdomen. This information is not intended to replace advice given to you by your health care provider. Make sure you discuss any questions you have with your health care provider. Document Revised: 10/15/2021 Document Reviewed: 10/15/2021 Elsevier  Patient Education  2024 Elsevier Inc. Monitored Anesthesia Care, Care After The following information offers guidance on how to care for yourself after your procedure. Your health care provider may also give you more specific instructions. If you have problems or questions, contact your health care provider. What can I expect after the procedure? After the procedure, it is common to have: Tiredness. Little or no memory about what happened during or after the procedure. Impaired judgment when it comes to making decisions. Nausea or vomiting. Some trouble with balance. Follow these instructions at home: For the time period you were told by your health care provider:  Rest. Do not participate in activities where you could fall or become injured. Do not drive or use machinery. Do not drink alcohol. Do not take sleeping pills or medicines that cause drowsiness. Do not make important decisions or sign legal documents. Do not take care of children on your own. Medicines Take over-the-counter and prescription medicines only as told by your health care provider. If you were prescribed antibiotics, take them as told by your health care provider. Do not stop using the antibiotic even if you start to feel better. Eating and drinking Follow instructions from your health care provider about what you may eat and drink. Drink enough fluid to keep your urine pale yellow. If you vomit: Drink clear fluids slowly and in small amounts as you are able. Clear fluids include water, ice chips, low-calorie sports drinks, and fruit juice that has water added to it (diluted fruit juice). Eat light and bland foods in small amounts as you are able. These foods include bananas, applesauce, rice, lean meats, toast, and crackers. General instructions  Have a responsible adult stay with you for the time you are told. It is important to have someone help care for you until you are awake and alert. If you have sleep  apnea, surgery and some medicines can increase your risk for breathing problems. Follow instructions from your health care provider about wearing your sleep device: When you are sleeping. This includes during daytime naps. While taking prescription pain medicines, sleeping medicines, or medicines that make you drowsy. Do not use any products that contain nicotine or tobacco. These products include cigarettes, chewing tobacco, and vaping devices, such as e-cigarettes. If you need help quitting, ask your health care provider. Contact a health care provider if: You feel nauseous or vomit every time you eat or drink. You feel light-headed. You are still sleepy or having trouble with balance after 24 hours. You get a rash. You have a fever. You have redness or swelling around the IV site. Get help right away if: You have trouble breathing. You have new confusion after you get home. These symptoms may be an emergency. Get  help right away. Call 911. Do not wait to see if the symptoms will go away. Do not drive yourself to the hospital. This information is not intended to replace advice given to you by your health care provider. Make sure you discuss any questions you have with your health care provider. Document Revised: 10/06/2021 Document Reviewed: 10/06/2021 Elsevier Patient Education  2024 [employer].

## 2022-12-09 ENCOUNTER — Encounter (HOSPITAL_COMMUNITY): Payer: Self-pay

## 2022-12-09 ENCOUNTER — Encounter (HOSPITAL_COMMUNITY)
Admission: RE | Admit: 2022-12-09 | Discharge: 2022-12-09 | Disposition: A | Discharge status: Home / Self Care | Payer: BC Managed Care – PPO | Source: Ambulatory Visit | Attending: Gastroenterology | Admitting: Gastroenterology

## 2022-12-09 VITALS — BP 145/87 | HR 92 | Temp 98.4°F | Resp 18 | Ht 63.0 in | Wt 219.6 lb

## 2022-12-09 DIAGNOSIS — Z01818 Encounter for other preprocedural examination: Secondary | ICD-10-CM

## 2022-12-09 DIAGNOSIS — Z01812 Encounter for preprocedural laboratory examination: Secondary | ICD-10-CM | POA: Insufficient documentation

## 2022-12-09 HISTORY — DX: Personal history of urinary calculi: Z87.442

## 2022-12-09 HISTORY — DX: Anxiety disorder, unspecified: F41.9

## 2022-12-09 LAB — POCT PREGNANCY, URINE: Preg Test, Ur: NEGATIVE

## 2022-12-09 NOTE — Telephone Encounter (Signed)
I figured as so. I advised the patient that since she was having issues that would not be preventative and it would have to be diagnostic.   Thank you!

## 2022-12-10 ENCOUNTER — Encounter (HOSPITAL_COMMUNITY): Payer: Self-pay | Admitting: Anesthesiology

## 2022-12-11 DIAGNOSIS — K6289 Other specified diseases of anus and rectum: Secondary | ICD-10-CM

## 2022-12-15 ENCOUNTER — Encounter (INDEPENDENT_AMBULATORY_CARE_PROVIDER_SITE_OTHER): Payer: Self-pay

## 2022-12-16 ENCOUNTER — Telehealth (INDEPENDENT_AMBULATORY_CARE_PROVIDER_SITE_OTHER): Payer: Self-pay | Admitting: Gastroenterology

## 2022-12-16 ENCOUNTER — Encounter (INDEPENDENT_AMBULATORY_CARE_PROVIDER_SITE_OTHER): Payer: Self-pay

## 2022-12-16 NOTE — Telephone Encounter (Signed)
Pt called in to reschedule flex sig from 01/05/23 to 01/01/23 at 1pm. New instructions printed and will be mailed to pt. Will call with pre op appt

## 2022-12-29 ENCOUNTER — Encounter (HOSPITAL_COMMUNITY): Payer: Self-pay

## 2022-12-29 ENCOUNTER — Other Ambulatory Visit: Payer: Self-pay

## 2022-12-30 ENCOUNTER — Encounter (HOSPITAL_COMMUNITY)
Admission: RE | Admit: 2022-12-30 | Discharge: 2022-12-30 | Disposition: A | Discharge status: Home / Self Care | Payer: BC Managed Care – PPO | Source: Ambulatory Visit | Attending: Gastroenterology | Admitting: Gastroenterology

## 2022-12-30 VITALS — Ht 63.0 in | Wt 219.6 lb

## 2022-12-30 DIAGNOSIS — Z01818 Encounter for other preprocedural examination: Secondary | ICD-10-CM

## 2023-01-01 ENCOUNTER — Other Ambulatory Visit (HOSPITAL_COMMUNITY)
Admission: RE | Admit: 2023-01-01 | Discharge: 2023-01-01 | Disposition: A | Discharge status: Home / Self Care | Payer: BC Managed Care – PPO | Source: Ambulatory Visit | Attending: Gastroenterology | Admitting: Gastroenterology

## 2023-01-01 ENCOUNTER — Ambulatory Visit (HOSPITAL_COMMUNITY)
Admission: RE | Admit: 2023-01-01 | Discharge: 2023-01-01 | Disposition: A | Discharge status: Home / Self Care | Payer: BC Managed Care – PPO | Attending: Gastroenterology | Admitting: Gastroenterology

## 2023-01-01 ENCOUNTER — Ambulatory Visit (HOSPITAL_COMMUNITY): Payer: BC Managed Care – PPO | Admitting: Anesthesiology

## 2023-01-01 ENCOUNTER — Encounter (HOSPITAL_COMMUNITY)
Admission: RE | Disposition: A | Discharge status: Home / Self Care | Payer: Self-pay | Source: Home / Self Care | Attending: Gastroenterology

## 2023-01-01 ENCOUNTER — Encounter (HOSPITAL_COMMUNITY): Payer: Self-pay | Admitting: Gastroenterology

## 2023-01-01 DIAGNOSIS — Z87891 Personal history of nicotine dependence: Secondary | ICD-10-CM | POA: Insufficient documentation

## 2023-01-01 DIAGNOSIS — K625 Hemorrhage of anus and rectum: Secondary | ICD-10-CM | POA: Diagnosis present

## 2023-01-01 DIAGNOSIS — Z7722 Contact with and (suspected) exposure to environmental tobacco smoke (acute) (chronic): Secondary | ICD-10-CM | POA: Insufficient documentation

## 2023-01-01 DIAGNOSIS — K648 Other hemorrhoids: Secondary | ICD-10-CM | POA: Diagnosis not present

## 2023-01-01 DIAGNOSIS — D128 Benign neoplasm of rectum: Secondary | ICD-10-CM | POA: Diagnosis not present

## 2023-01-01 DIAGNOSIS — Z01818 Encounter for other preprocedural examination: Secondary | ICD-10-CM

## 2023-01-01 DIAGNOSIS — K6289 Other specified diseases of anus and rectum: Secondary | ICD-10-CM

## 2023-01-01 DIAGNOSIS — K641 Second degree hemorrhoids: Secondary | ICD-10-CM

## 2023-01-01 DIAGNOSIS — K602 Anal fissure, unspecified: Secondary | ICD-10-CM

## 2023-01-01 HISTORY — PX: SUBMUCOSAL TATTOO INJECTION: SHX6856

## 2023-01-01 HISTORY — PX: FLEXIBLE SIGMOIDOSCOPY: SHX5431

## 2023-01-01 HISTORY — PX: BIOPSY: SHX5522

## 2023-01-01 LAB — POCT PREGNANCY, URINE: Preg Test, Ur: NEGATIVE

## 2023-01-01 LAB — COMPREHENSIVE METABOLIC PANEL
ALT: 32 U/L (ref 0–44)
AST: 24 U/L (ref 15–41)
Albumin: 4.1 g/dL (ref 3.5–5.0)
Alkaline Phosphatase: 95 U/L (ref 38–126)
Anion gap: 11 (ref 5–15)
BUN: 9 mg/dL (ref 6–20)
CO2: 24 mmol/L (ref 22–32)
Calcium: 8.7 mg/dL — ABNORMAL LOW (ref 8.9–10.3)
Chloride: 104 mmol/L (ref 98–111)
Creatinine, Ser: 0.54 mg/dL (ref 0.44–1.00)
GFR, Estimated: >60 mL/min (ref >60)
Glucose, Bld: 90 mg/dL (ref 70–99)
Potassium: 3.3 mmol/L — ABNORMAL LOW (ref 3.5–5.1)
Sodium: 139 mmol/L (ref 135–145)
Total Bilirubin: 0.7 mg/dL (ref 0.3–1.2)
Total Protein: 7.4 g/dL (ref 6.5–8.1)

## 2023-01-01 LAB — CBC
HCT: 44.3 % (ref 36.0–46.0)
Hemoglobin: 14.2 g/dL (ref 12.0–15.0)
MCH: 27.7 pg (ref 26.0–34.0)
MCHC: 32.1 g/dL (ref 30.0–36.0)
MCV: 86.4 fL (ref 80.0–100.0)
Platelets: 291 10*3/uL (ref 150–400)
RBC: 5.13 MIL/uL — ABNORMAL HIGH (ref 3.87–5.11)
RDW: 13.6 % (ref 11.5–15.5)
WBC: 14.8 10*3/uL — ABNORMAL HIGH (ref 4.0–10.5)
nRBC: 0 % (ref 0.0–0.2)

## 2023-01-01 SURGERY — SIGMOIDOSCOPY, FLEXIBLE
Anesthesia: General

## 2023-01-01 MED ORDER — PROPOFOL 10 MG/ML IV BOLUS
INTRAVENOUS | Status: DC | PRN
Start: 2023-01-01 — End: 2023-01-01
  Administered 2023-01-01: 30 mg via INTRAVENOUS
  Administered 2023-01-01: 100 mg via INTRAVENOUS
  Administered 2023-01-01: 20 mg via INTRAVENOUS
  Administered 2023-01-01: 100 mg via INTRAVENOUS

## 2023-01-01 MED ORDER — LIDOCAINE HCL (PF) 2 % IJ SOLN
INTRAMUSCULAR | Status: AC
  Filled 2023-01-01: qty 5

## 2023-01-01 MED ORDER — LIDOCAINE HCL 1 % IJ SOLN
INTRAMUSCULAR | Status: DC | PRN
  Administered 2023-01-01: 50 mg via INTRADERMAL

## 2023-01-01 MED ORDER — SPOT INK MARKER SYRINGE KIT
PACK | SUBMUCOSAL | Status: DC | PRN
  Administered 2023-01-01: 1 mL via SUBMUCOSAL

## 2023-01-01 MED ORDER — SPOT INK MARKER SYRINGE KIT
PACK | SUBMUCOSAL | Status: AC
  Filled 2023-01-01: qty 5

## 2023-01-01 MED ORDER — LACTATED RINGERS IV SOLN: INTRAVENOUS | Status: DC

## 2023-01-01 NOTE — Anesthesia Preprocedure Evaluation (Signed)
Anesthesia Evaluation  Patient identified by MRN, date of birth, ID band Patient awake    Reviewed: Allergy & Precautions, H&P , NPO status , Patient's Chart, lab work & pertinent test results  History of Anesthesia Complications (+) PONV and history of anesthetic complications  Airway Mallampati: II  TM Distance: >3 FB Neck ROM: Full    Dental  (+) Dental Advisory Given, Teeth Intact   Pulmonary neg pulmonary ROS, former smoker   Pulmonary exam normal breath sounds clear to auscultation       Cardiovascular negative cardio ROS Normal cardiovascular exam Rhythm:Regular Rate:Normal     Neuro/Psych  PSYCHIATRIC DISORDERS Anxiety     negative neurological ROS     GI/Hepatic negative GI ROS, Neg liver ROS,,,  Endo/Other  Hypothyroidism    Renal/GU Renal disease  negative genitourinary   Musculoskeletal negative musculoskeletal ROS (+)    Abdominal   Peds negative pediatric ROS (+)  Hematology negative hematology ROS (+)   Anesthesia Other Findings   Reproductive/Obstetrics negative OB ROS                             Anesthesia Physical Anesthesia Plan  ASA: 2  Anesthesia Plan: General   Post-op Pain Management: Minimal or no pain anticipated   Induction: Intravenous  PONV Risk Score and Plan: Treatment may vary due to age or medical condition  Airway Management Planned: Nasal Cannula and Natural Airway  Additional Equipment:   Intra-op Plan:   Post-operative Plan:   Informed Consent: I have reviewed the patients History and Physical, chart, labs and discussed the procedure including the risks, benefits and alternatives for the proposed anesthesia with the patient or authorized representative who has indicated his/her understanding and acceptance.     Dental advisory given  Plan Discussed with: CRNA and Surgeon  Anesthesia Plan Comments:        Anesthesia Quick  Evaluation

## 2023-01-01 NOTE — H&P (Signed)
Kimberly Morgan is an 42 y.o. female.   Chief Complaint: rectal pain and bleeding HPI: Kimberly Morgan is a 42 y.o. female with past medical history of hypothyroidism, who presents for evaluation of rectal pain and bleeding.   Patient reports that she was having significant improvement of her symptoms until she took the enemas for flex sig today.  Has noted large amount of bleeding today and rectal discomfort.  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   PONV (postoperative nausea and vomiting)    Preterm labor    Seasonal allergies    Supervision of other high-risk pregnancy 10/29/2015    [facility] Initiated Care at     9+3 weeks FOB  Rudell Cobb Dating By  [place] Pap  05/28/15 negative GC/CT Initial:                36+wks: Genetic Screen NT/IT:  CF screen  Anatomic [place]  Flu vaccine  Tdap Recommended ~ 28wks Glucose Screen  2 hr GBS  Feed Preference  Contraception  Circumcision  Childbirth Classes  Pediatrician     Thyroid disease    Phreesia 10/28/2019   UTI (lower urinary tract infection)     Past Surgical History:  Procedure Laterality Date   CERVICAL CERCLAGE  09/30/2011   Procedure: CERCLAGE CERVICAL;  Surgeon: [name], MD;  Location: [facility];  Service: Gynecology;  Laterality: N/A;   CERVICAL CERCLAGE N/A 12/01/2012   Procedure: [facility] CERCLAGE CERVICAL;  Surgeon: [name], MD;  Location: [facility];  Service: Gynecology;  Laterality: N/A;   CERVICAL CERCLAGE N/A 11/25/2015   Procedure: CERCLAGE CERVICAL--[place]Donald cerclage;  Surgeon: [name], MD;  Location: [facility];  Service: Gynecology;  Laterality: N/A;   CHOLECYSTECTOMY     laparoscopic- 2011    Family History  Problem Relation Age of Onset   Diabetes Paternal Grandfather    Cancer Maternal Grandfather        lung   COPD Mother    Kidney  disease Mother    Hypertension Sister    Social History:  reports that she quit smoking about 11 years ago. Her smoking use included cigarettes. She started smoking about 21 years ago. She has a 2.5 pack-year smoking history. She has been exposed to tobacco smoke. She has never used smokeless tobacco. She reports current alcohol use. She reports that she does not use drugs.  Allergies:  Allergies  Allergen Reactions   Aspirin Anaphylaxis    Headache treated by Goodie powder,then  BC's then regular aspirin over a 12 hour period, had SOB , difficulty breathing , tongue swelling.and rash.    Egg-Derived Products Anaphylaxis, Hives and Shortness Of Breath    Duck eggs only, can eat chicken eggs   Bactrim [Sulfamethoxazole-Trimethoprim] Nausea And Vomiting   Baker's Yeast [Yeast] Hives and Other (See Comments)    Reaction: extreme all over body yeast infections.   Latex Rash    burning    Medications Prior to Admission  Medication Sig Dispense Refill   acetaminophen (TYLENOL) 500 MG tablet Take 1,000 mg by mouth every 6 (six) hours as needed for moderate pain.     clotrimazole (LOTRIMIN) 1 % external solution Apply 1 Application topically 2 (two) times daily. (Patient taking differently: Apply 1 Application topically 2 (two) times daily. Apply  rectally) 60 mL 0   diltiazem 2 % GEL Apply 1 Application topically 2 (two) times daily. (Patient taking differently: Apply 1 Application topically 2 (two) times daily. Apply rectally) 30 g 2   EPINEPHrine 0.3 mg/0.3 mL IJ SOAJ injection Inject 0.3 mg into the muscle as needed for anaphylaxis.     levothyroxine (SYNTHROID) 125 MCG tablet TAKE 1 TABLET(125 MCG) BY MOUTH DAILY 90 tablet 3   fluconazole (DIFLUCAN) 150 MG tablet TAKE 1 TABLET BY MOUTH NOW. REPEAT IN 3 DAYS, AND TAKE 1 TABLET MONTHLY BEFORE PERIOD. (Patient taking differently: Take 150 mg by mouth every 30 (thirty) days.) 2 tablet 6   hydrocortisone (ANUSOL-HC) 25 MG suppository Place 25 mg  rectally daily.      No results found for this or any previous visit (from the past 48 hour(s)). No results found.  Review of Systems  All other systems reviewed and are negative.   Blood pressure (!) 145/98, pulse (!) 101, temperature 99 F (37.2 C), resp. rate 18, height 5\' 3"  (1.6 m), weight 99.6 kg, last menstrual period 12/23/2022, SpO2 100%. Physical Exam  GENERAL: The patient is AO x3, in no acute distress. HEENT: Head is normocephalic and atraumatic. EOMI are intact. Mouth is well hydrated and without lesions. NECK: Supple. No masses LUNGS: Clear to auscultation. No presence of rhonchi/wheezing/rales. Adequate chest expansion HEART: RRR, normal s1 and s2. ABDOMEN: Soft, nontender, no guarding, no peritoneal signs, and nondistended. BS +. No masses. EXTREMITIES: Without any cyanosis, clubbing, rash, lesions or edema. NEUROLOGIC: AOx3, no focal motor deficit. SKIN: no jaundice, no rashes  Assessment/Plan Kimberly Morgan is a 42 y.o. female with past medical history of hypothyroidism, who presents for evaluation of rectal pain and bleeding. We will proceed with flexible sigmoidoscopy  Dolores Frame, MD 01/01/2023, 11:25 AM

## 2023-01-01 NOTE — Anesthesia Postprocedure Evaluation (Signed)
Anesthesia Post Note  Patient: Kimberly Morgan  Procedure(s) Performed: FLEXIBLE SIGMOIDOSCOPY BIOPSY SUBMUCOSAL TATTOO INJECTION  Patient location during evaluation: Short Stay Anesthesia Type: General Level of consciousness: awake and alert Pain management: pain level controlled Vital Signs Assessment: post-procedure vital signs reviewed and stable Respiratory status: spontaneous breathing Cardiovascular status: blood pressure returned to baseline and stable Postop Assessment: no apparent nausea or vomiting Anesthetic complications: no   No notable events documented.   Last Vitals:  Vitals:   01/01/23 1122  BP: (!) 145/98  Pulse: (!) 101  Resp: 18  Temp: 37.2 C  SpO2: 100%    Last Pain:  Vitals:   01/01/23 1131  PainSc: 5                  Harriet Bollen

## 2023-01-01 NOTE — Op Note (Addendum)
[facility] Patient Name: Kimberly Morgan Procedure Date: 01/01/2023 11:23 AM MRN: [id number] Date of Birth: [birth year] Attending MD: Katrinka Blazing , , [id number] CSN: [id number] Age: 42 Admit Type: Outpatient Procedure:                Flexible Sigmoidoscopy Indications:              Rectal hemorrhage, Rectal pain Providers:                Katrinka Blazing, Crystal Page, Pandora Leiter,                            Technician Referring MD:              Medicines:                Monitored Anesthesia Care Complications:            No immediate complications. Estimated Blood Loss:     Estimated blood loss: none. Procedure:                Pre-Anesthesia Assessment:                           - Prior to the procedure, a History and Physical                            was performed, and patient medications, allergies                            and sensitivities were reviewed. The patient's                            tolerance of previous anesthesia was reviewed.                           - The risks and benefits of the procedure and the                            sedation options and risks were discussed with the                            patient. All questions were answered and informed                            consent was obtained.                           - ASA Grade Assessment: II - A patient with mild                            systemic disease.                           After obtaining informed consent, the scope was                            passed under direct vision. The PCF-HQ190L                            (  [id number]) scope was introduced through the anus and                            advanced to the the sigmoid colon. The flexible                            sigmoidoscopy was accomplished without difficulty.                            The patient tolerated the procedure well. The                            quality of the bowel preparation was fair. Scope In: 11:36:30  AM Scope Out: 11:43:34 AM Total Procedure Duration: 0 hours 7 minutes 4 seconds  Findings:      Internal hemorrhoids were found on perianal exam. Mildly protruding via       anal canal.      A 15 to 20 mm polyp was found at 8 cm proximal to the anus. The polyp       was Paris classification mixed IIc + IIa (mucosal depression with raised       edges). The tissue was very friable. This was concerning for malignancy.       Biopsies were taken with a cold forceps for histology. Area       contralateral to the lesion was tattooed with an injection of 1 mL of       Spot (carbon black).      Non-bleeding internal hemorrhoids were found during retroflexion. The       hemorrhoids were small. Impression:               - Preparation of the colon was fair.                           - Hemorrhoids found on perianal exam.                           - One 15 to 20 mm polyp at 8 cm proximal to the                            anus. Biopsied. Tattooed.                           - Non-bleeding internal hemorrhoids. Moderate Sedation:      Per Anesthesia Care Recommendation:           - Discharge patient to home (ambulatory).                           - Resume previous diet.                           - Continue present medications.                           - Await pathology results.                           -  Schedule colonoscopy for evaluation of the colon.                           - Check CBC, CMP and CEA. Procedure Code(s):        --- Professional ---                           [phone], Sigmoidoscopy, flexible; with biopsy, single                            or multiple                           [id number], Sigmoidoscopy, flexible; with directed                            submucosal injection(s), any substance Diagnosis Code(s):        --- Professional ---                           K64.8, Other hemorrhoids                           D12.6, Benign neoplasm of colon, unspecified                            K62.5, Hemorrhage of anus and rectum                           K62.89, Other specified diseases of anus and rectum CPT copyright 2022 American Medical Association. All rights reserved. The codes documented in this report are preliminary and upon coder review may  be revised to meet current compliance requirements. Katrinka Blazing, MD Katrinka Blazing,  01/01/2023 11:53:23 AM This report has been signed electronically. Number of Addenda: 0

## 2023-01-01 NOTE — Discharge Instructions (Signed)
You are being discharged to home.  Resume your previous diet.  Continue your present medications.  We are waiting for your pathology results.  Schedule colonoscopy for evaluation of the colon.

## 2023-01-01 NOTE — Transfer of Care (Signed)
Immediate Anesthesia Transfer of Care Note  Patient: Kimberly Morgan  Procedure(s) Performed: FLEXIBLE SIGMOIDOSCOPY BIOPSY SUBMUCOSAL TATTOO INJECTION  Patient Location: Short Stay  Anesthesia Type:General  Level of Consciousness: awake  Airway & Oxygen Therapy: Patient Spontanous Breathing  Post-op Assessment: Report given to RN  Post vital signs: Reviewed and stable  Last Vitals:  Vitals Value Taken Time  BP    Temp    Pulse    Resp    SpO2      Last Pain:  Vitals:   01/01/23 1131  PainSc: 5       Patients Stated Pain Goal: 4 (01/01/23 1121)  Complications: No notable events documented.

## 2023-01-05 ENCOUNTER — Ambulatory Visit (HOSPITAL_COMMUNITY): Admit: 2023-01-05 | Payer: BC Managed Care – PPO | Admitting: Gastroenterology

## 2023-01-05 ENCOUNTER — Encounter (HOSPITAL_COMMUNITY): Payer: Self-pay

## 2023-01-05 ENCOUNTER — Encounter (HOSPITAL_COMMUNITY): Payer: Self-pay | Admitting: Gastroenterology

## 2023-01-05 DIAGNOSIS — K6289 Other specified diseases of anus and rectum: Secondary | ICD-10-CM

## 2023-01-05 SURGERY — SIGMOIDOSCOPY, FLEXIBLE
Anesthesia: Monitor Anesthesia Care

## 2023-01-06 ENCOUNTER — Encounter (INDEPENDENT_AMBULATORY_CARE_PROVIDER_SITE_OTHER): Payer: Self-pay

## 2023-01-06 ENCOUNTER — Other Ambulatory Visit (INDEPENDENT_AMBULATORY_CARE_PROVIDER_SITE_OTHER): Payer: Self-pay | Admitting: Gastroenterology

## 2023-01-06 ENCOUNTER — Encounter (INDEPENDENT_AMBULATORY_CARE_PROVIDER_SITE_OTHER): Payer: Self-pay | Admitting: Gastroenterology

## 2023-01-06 ENCOUNTER — Telehealth (INDEPENDENT_AMBULATORY_CARE_PROVIDER_SITE_OTHER): Payer: Self-pay | Admitting: Gastroenterology

## 2023-01-06 DIAGNOSIS — F411 Generalized anxiety disorder: Secondary | ICD-10-CM

## 2023-01-06 MED ORDER — PEG 3350-KCL-NA BICARB-NACL 420 G PO SOLR: 4000.0000 mL | Freq: Once | ORAL | 0 refills | Status: AC

## 2023-01-06 MED ORDER — LORAZEPAM 0.5 MG PO TABS
0.5000 mg | ORAL_TABLET | Freq: Every evening | ORAL | 0 refills | Status: DC | PRN
Start: 2023-01-06 — End: 2023-11-02

## 2023-01-06 NOTE — Telephone Encounter (Signed)
Pt called in to office to change TCS from 01/08/23 to 01/12/23.   Pt asked if provider could send in something for her nerves. Pt states she has not slept since last Friday. Pt also states that she spoke with one of her friends whom is a medical doctor and he recommends a Careers adviser at [facility] if the area in question is to be removed. Surgeon at [facility] is Dr.Shein (Pt using [facility] at [address])

## 2023-01-06 NOTE — Telephone Encounter (Signed)
Pt contacted and verbalized understanding.  

## 2023-01-06 NOTE — Telephone Encounter (Signed)
Please let the patient know I sent Ativan to take as needed at night for anxiety.  If she needs more of this, she should reach her PCP for further refills or to provide other medications for anxiety.  Also let her know that I checked Dr. Kathrin Penner profile and he does surgery for gastrointestinal cancers.  Usually it is good for a surgeon to have an evaluation by an oncologist so she may need to be referred to oncology to [facility], if that is where she wants to go.  We could take care of her at [facility] in [place] with the colorectal surgeons and oncologist.  Will discuss this further based on what we see on the colonoscopy.

## 2023-01-07 ENCOUNTER — Other Ambulatory Visit (HOSPITAL_COMMUNITY): Payer: Self-pay | Admitting: *Deleted

## 2023-01-08 ENCOUNTER — Encounter (HOSPITAL_COMMUNITY)
Admission: RE | Admit: 2023-01-08 | Discharge: 2023-01-08 | Disposition: A | Discharge status: Home / Self Care | Payer: BC Managed Care – PPO | Source: Ambulatory Visit | Attending: Gastroenterology | Admitting: Gastroenterology

## 2023-01-08 ENCOUNTER — Other Ambulatory Visit: Payer: Self-pay

## 2023-01-08 ENCOUNTER — Encounter (HOSPITAL_COMMUNITY): Payer: Self-pay

## 2023-01-08 VITALS — Ht 63.0 in | Wt 219.6 lb

## 2023-01-08 DIAGNOSIS — Z01818 Encounter for other preprocedural examination: Secondary | ICD-10-CM

## 2023-01-11 ENCOUNTER — Telehealth (INDEPENDENT_AMBULATORY_CARE_PROVIDER_SITE_OTHER): Payer: Self-pay | Admitting: *Deleted

## 2023-01-11 NOTE — Telephone Encounter (Signed)
Patient presented to office today, she is scheduled for TCS 01/12/20 - patient states she saw a surgeon at [facility] who told her she needed to have a colonoscopy with anal ultrasound - she wants to cancel the TCS with [place] and she will have it with the surgeon - please cancel TCS for tomorrow

## 2023-01-11 NOTE — Telephone Encounter (Signed)
Thanks for the update

## 2023-01-11 NOTE — Telephone Encounter (Signed)
Message sent to carolyn to cancel. FYI to Dr. Levon Hedger.

## 2023-01-12 ENCOUNTER — Encounter (HOSPITAL_COMMUNITY): Admission: RE | Payer: Self-pay | Source: Home / Self Care

## 2023-01-12 ENCOUNTER — Ambulatory Visit (HOSPITAL_COMMUNITY)
Admission: RE | Admit: 2023-01-12 | Payer: BC Managed Care – PPO | Source: Home / Self Care | Admitting: Gastroenterology

## 2023-01-12 SURGERY — COLONOSCOPY WITH PROPOFOL
Anesthesia: Monitor Anesthesia Care

## 2023-01-12 NOTE — Telephone Encounter (Signed)
Spoke with pt.  Pt having colonoscopy at [facility] on Sept 5 with anal ultrasound.

## 2023-02-01 ENCOUNTER — Ambulatory Visit (INDEPENDENT_AMBULATORY_CARE_PROVIDER_SITE_OTHER): Payer: BC Managed Care – PPO | Admitting: Gastroenterology

## 2023-02-04 ENCOUNTER — Telehealth: Payer: Self-pay | Admitting: Adult Health

## 2023-02-04 NOTE — Telephone Encounter (Signed)
Called pt had gotten report from [facility] about colon and +adenocarcinoma, let me know if needs anything and will keep her in my prayers, has appt with surgeon Monday

## 2023-02-09 ENCOUNTER — Other Ambulatory Visit: Payer: Self-pay | Admitting: Adult Health

## 2023-02-09 MED ORDER — FLUCONAZOLE 150 MG PO TABS: 150.0000 mg | ORAL_TABLET | ORAL | 6 refills | Status: DC

## 2023-02-09 NOTE — Progress Notes (Signed)
Refilled diflucan

## 2023-02-15 ENCOUNTER — Ambulatory Visit (INDEPENDENT_AMBULATORY_CARE_PROVIDER_SITE_OTHER): Payer: BC Managed Care – PPO | Admitting: Gastroenterology

## 2023-02-23 HISTORY — PX: COLON RESECTION: SHX5231

## 2023-03-05 ENCOUNTER — Encounter: Payer: Self-pay | Admitting: Adult Health

## 2023-03-05 DIAGNOSIS — C189 Malignant neoplasm of colon, unspecified: Secondary | ICD-10-CM | POA: Insufficient documentation

## 2023-03-08 ENCOUNTER — Other Ambulatory Visit (HOSPITAL_COMMUNITY): Payer: Self-pay | Admitting: Adult Health

## 2023-03-08 DIAGNOSIS — Z1231 Encounter for screening mammogram for malignant neoplasm of breast: Secondary | ICD-10-CM

## 2023-04-12 ENCOUNTER — Ambulatory Visit (HOSPITAL_COMMUNITY)
Admission: RE | Admit: 2023-04-12 | Discharge: 2023-04-12 | Disposition: A | Discharge status: Home / Self Care | Payer: BC Managed Care – PPO | Source: Ambulatory Visit | Attending: Adult Health | Admitting: Adult Health

## 2023-04-12 DIAGNOSIS — Z1231 Encounter for screening mammogram for malignant neoplasm of breast: Secondary | ICD-10-CM | POA: Diagnosis present

## 2023-07-25 IMAGING — MG DIGITAL DIAGNOSTIC BILAT W/ TOMO W/ CAD
8 series · 8 of 24 positions shown · non-contrast
Comparison: Previous exam(s).

ACR Breast Density Category a: The breast tissue is almost entirely
fatty.

CLINICAL DATA: Short-term follow-up for a likely benign right
breast asymmetry.

EXAM:
DIGITAL DIAGNOSTIC BILATERAL MAMMOGRAM WITH TOMOSYNTHESIS AND CAD
TECHNIQUE: Bilateral digital diagnostic mammography and breast tomosynthesis
was performed. The images were evaluated with computer-aided
detection.

[L MLO synth-2D]
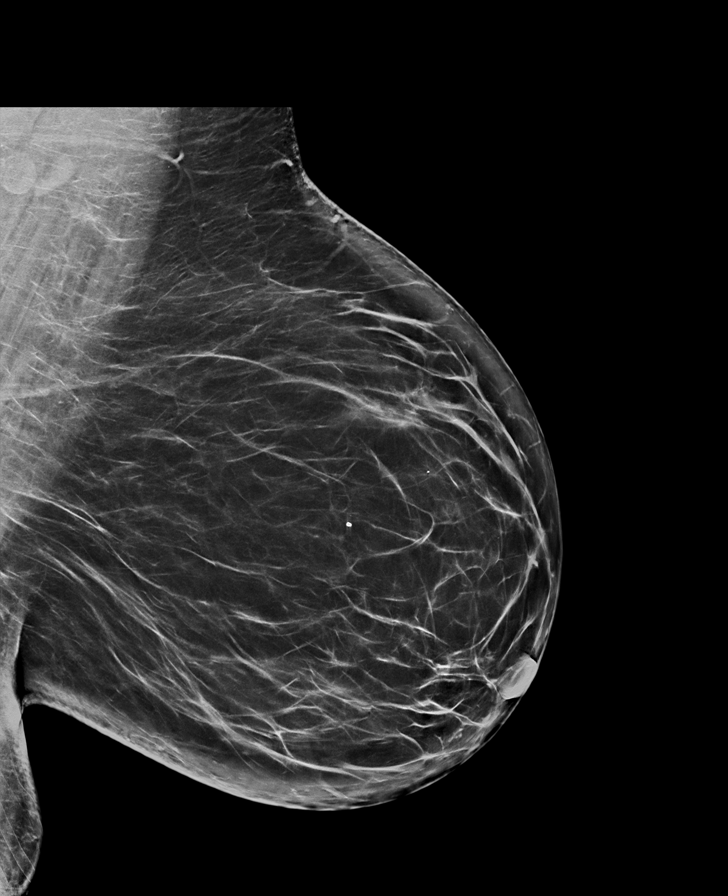

[R CC synth-2D]
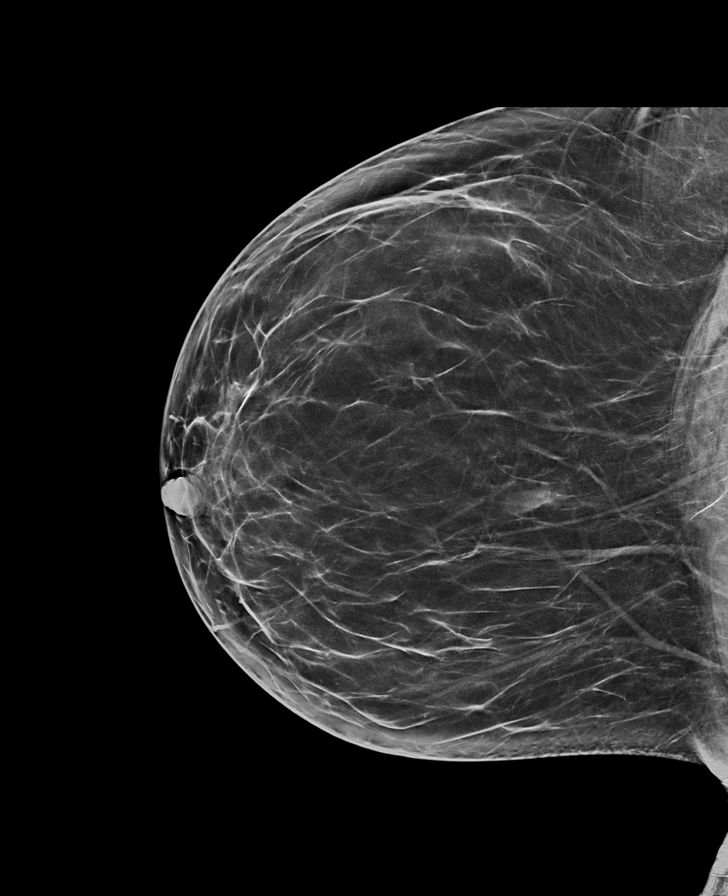

[L CC synth-2D]
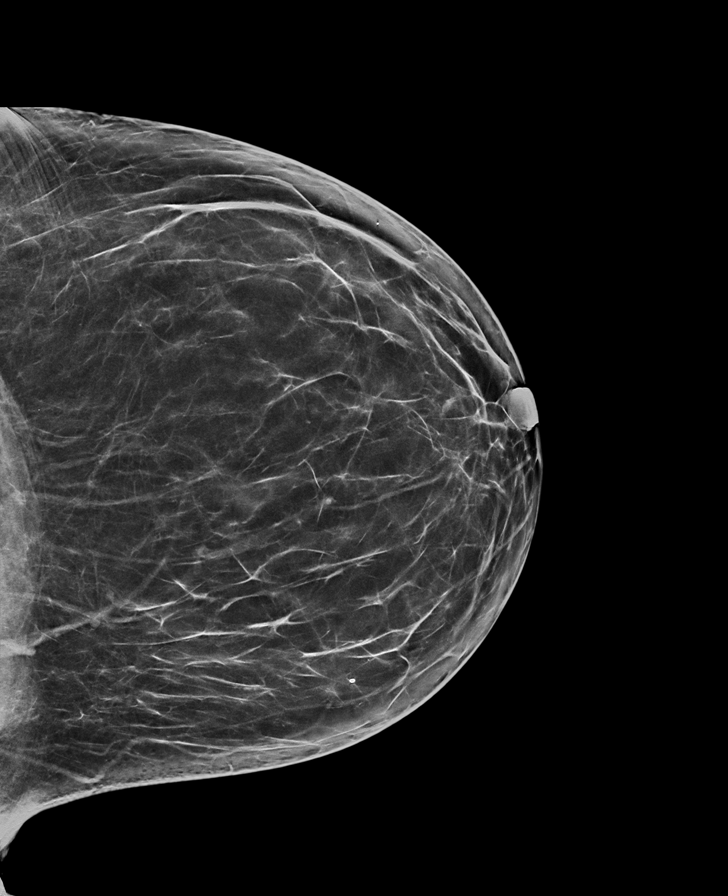

[R MLO synth-2D]
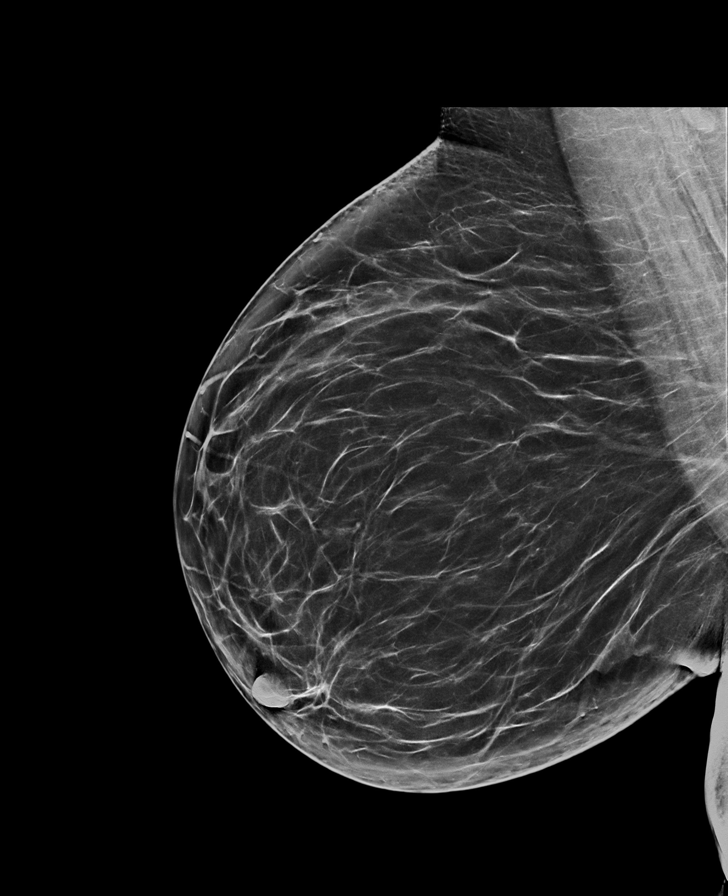

[R CC tomo · tomo slice 39/76.0]
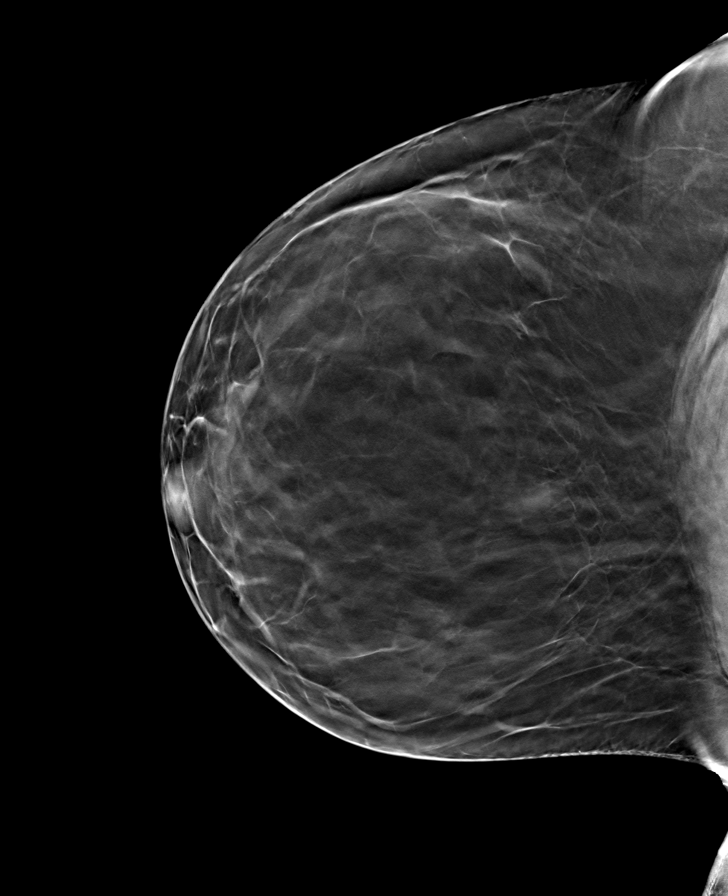

[L CC tomo · tomo slice 39/77.0]
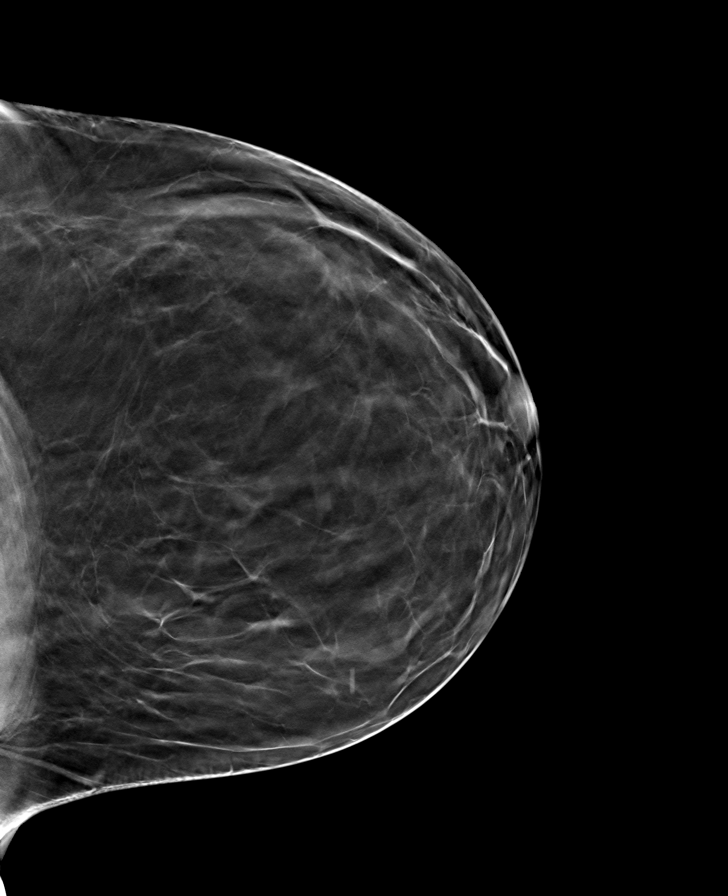

[R MLO tomo · tomo slice 43/86.0]
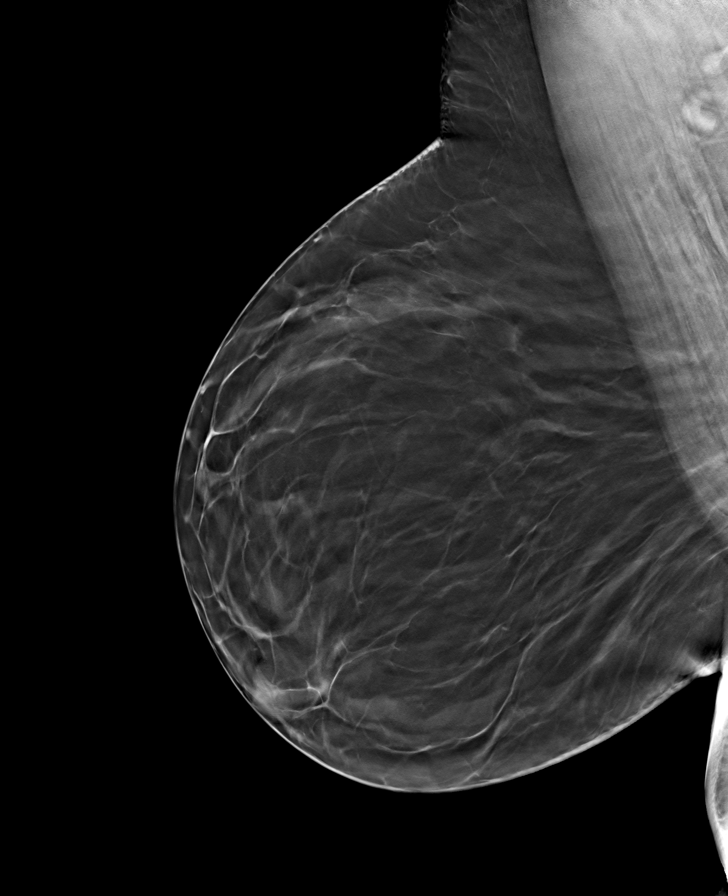

[L MLO tomo · tomo slice 45/90.0]
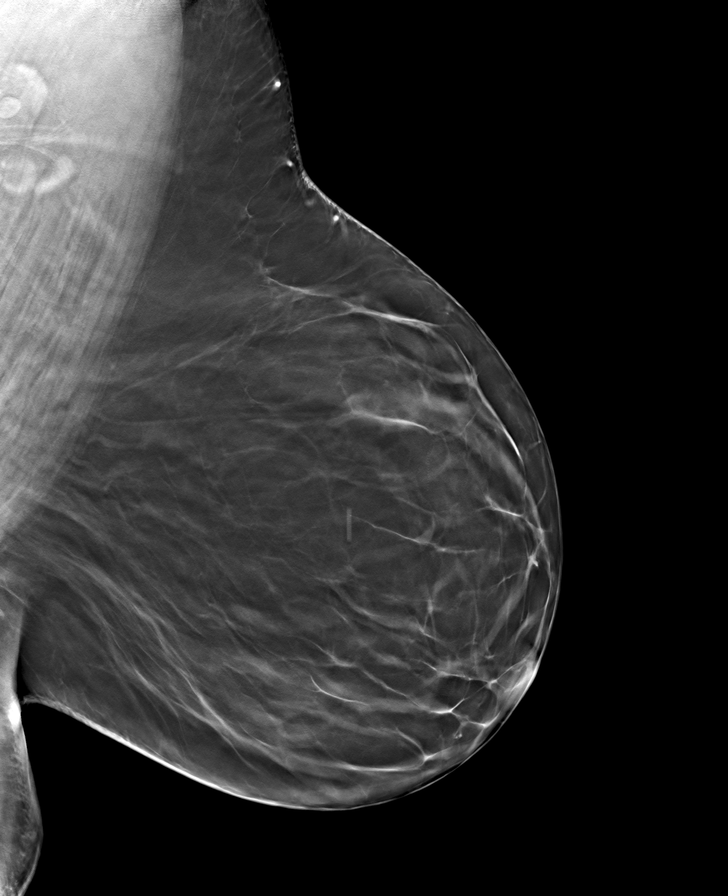

[8 of 24 positions shown; findings below may reference images not displayed]

FINDINGS: The asymmetry in the inferior posterior right breast is
mammographically stable. No suspicious calcifications, masses or
areas of distortion are seen in the bilateral breasts.
IMPRESSION: 1.  The likely benign right breast asymmetry is stable.

2.  No mammographic evidence of malignancy in the bilateral breasts.

RECOMMENDATION:
Diagnostic mammogram is suggested in 1 year. (Code:[id number]-M-Q0N)

I have discussed the findings and recommendations with the patient.
If applicable, a reminder letter will be sent to the patient
regarding the next appointment.

BI-RADS CATEGORY  3: Probably benign.

## 2023-11-02 ENCOUNTER — Other Ambulatory Visit (HOSPITAL_COMMUNITY)
Admission: RE | Admit: 2023-11-02 | Discharge: 2023-11-02 | Disposition: A | Discharge status: Home / Self Care | Source: Ambulatory Visit | Attending: Adult Health | Admitting: Adult Health

## 2023-11-02 ENCOUNTER — Encounter: Payer: Self-pay | Admitting: Adult Health

## 2023-11-02 ENCOUNTER — Ambulatory Visit (INDEPENDENT_AMBULATORY_CARE_PROVIDER_SITE_OTHER): Payer: Self-pay | Admitting: Adult Health

## 2023-11-02 VITALS — BP 173/94 | HR 76 | Ht 63.0 in | Wt 224.5 lb

## 2023-11-02 DIAGNOSIS — R490 Dysphonia: Secondary | ICD-10-CM | POA: Diagnosis not present

## 2023-11-02 DIAGNOSIS — Z1331 Encounter for screening for depression: Secondary | ICD-10-CM | POA: Diagnosis not present

## 2023-11-02 DIAGNOSIS — Z13220 Encounter for screening for lipoid disorders: Secondary | ICD-10-CM

## 2023-11-02 DIAGNOSIS — R030 Elevated blood-pressure reading, without diagnosis of hypertension: Secondary | ICD-10-CM | POA: Insufficient documentation

## 2023-11-02 DIAGNOSIS — E039 Hypothyroidism, unspecified: Secondary | ICD-10-CM

## 2023-11-02 DIAGNOSIS — E049 Nontoxic goiter, unspecified: Secondary | ICD-10-CM | POA: Insufficient documentation

## 2023-11-02 DIAGNOSIS — Z01419 Encounter for gynecological examination (general) (routine) without abnormal findings: Secondary | ICD-10-CM

## 2023-11-02 DIAGNOSIS — C189 Malignant neoplasm of colon, unspecified: Secondary | ICD-10-CM

## 2023-11-02 NOTE — Progress Notes (Signed)
 Patient ID: Kimberly Morgan, female   DOB: 1981-03-27, 43 y.o.   MRN: [id number] History of Present Illness: Kimberly Morgan is a 43 year old white female,married, [id number], in for a well woman gyn exam and she requests a pap this year.  She had colon cancer with a colon resection 02/23/23 at [facility] and is doing well. She has had hoarseness for about 2 weeks and feels dry. She had heart palpitations this week and felt SHOB.   PCP is Dr Lorraine Roses    Current Medications, Allergies, Past Medical History, Past Surgical History, Family History and Social History were reviewed in [employer] electronic medical record.     Review of Systems: Patient denies any headaches, hearing loss, fatigue, blurred vision, chest pain, abdominal pain, problems with bowel movements, urination, or intercourse. No joint pain or mood swings.  See HPI for positives    Physical Exam:BP (!) 173/94 (BP Location: Right Arm, Patient Position: Sitting, Cuff Size: Large)   Pulse 76   Ht 5\' 3"  (1.6 m)   Wt 224 lb 8 oz (101.8 kg)   LMP 11/02/2023 (Exact Date)   BMI 39.77 kg/m   General:  Well developed, well nourished, no acute distress Skin:  Warm and dry Neck:  Midline trachea,  thyroid feels enlarged, good ROM, no lymphadenopathy Lungs; Clear to auscultation bilaterally Breast:  No dominant palpable mass, retraction, or nipple discharge Cardiovascular: Regular rate and rhythm Abdomen:  Soft, non tender, no hepatosplenomegaly Pelvic:  External genitalia is normal in appearance, no lesions.  The vagina is normal in appearance,+period blood. Urethra has no lesions or masses. The cervix is smooth, pap with HR HPV genotyping performed.  Uterus is felt to be normal size, shape, and contour.  No adnexal masses or tenderness noted.Bladder is non tender, no masses felt. Rectal: deferred on period  Extremities/musculoskeletal:  No swelling or varicosities noted, no clubbing or cyanosis Psych:  No mood changes, alert and  cooperative,seems happy AA is 1 Fall risk is low    11/02/2023   10:01 AM 10/28/2022   11:32 AM 09/25/2021    1:48 PM  Depression screen PHQ 2/9  Decreased Interest 0 0 0  Down, Depressed, Hopeless 1 1 1   PHQ - 2 Score 1 1 1   Altered sleeping 1 2 0  Tired, decreased energy 1 1 2   Change in appetite 1 1 1   Feeling bad or failure about yourself  0 0 1  Trouble concentrating 1 0 0  Moving slowly or fidgety/restless 0 0 0  Suicidal thoughts 0 0 0  PHQ-9 Score 5 5 5        11/02/2023   10:01 AM 10/28/2022   11:33 AM 09/25/2021    1:50 PM 07/22/2020    1:47 PM  GAD 7 : Generalized Anxiety Score  Nervous, Anxious, on Edge 2 1 1 1   Control/stop worrying 2 1 1 1   Worry too much - different things 2 1 1 1   Trouble relaxing 2 1 0 0  Restless 2 0 0 0  Easily annoyed or irritable 0 0 0 0  Afraid - awful might happen 3 1 1  0  Total GAD 7 Score 13 5 4 3     Upstream - 11/02/23 0959       Pregnancy Intention Screening   Does the patient want to become pregnant in the next year? Ok Either Way    Does the patient's partner want to become pregnant in the next year? Ok Either Kimberly Morgan  Would the patient like to discuss contraceptive options today? No      Contraception Wrap Up   Current Method No Method - Other Reason    End Method No Method - Other Reason    Contraception Counseling Provided No              Examination chaperoned by Alphonso Aschoff LPN   Impression and plan: 1. Encounter for gynecological examination with Papanicolaou smear of cervix (Primary) Pap sent Pap in 3 years if  normal Will check labs  - CBC - Comprehensive metabolic panel with GFR - Lipid panel - Cytology - PAP( [place]) Mammogram was negative 04/02/23   2. Hypothyroidism, unspecified type On synthroid  125 mcg  - TSH + free T4  3. Hoarseness + hoarseness for 2 weeks, if US  normal and hoarseness persists, will get ENT consult  Getting thyroid US  11/09/23 at [facility]  - US  THYROID; Future  4. Enlarged  thyroid Thyroid US  11/09/23 at 12:30 pm at [facility] - US  THYROID; Future  5. Elevated BP without diagnosis of hypertension Keep on BP at home and let  - Comprehensive metabolic panel with GFR  6. Screening cholesterol level - Lipid panel  7. Adenocarcinoma of colon [facility]) Had colon resection 02/2023 Having colonoscopy 02/24/24

## 2023-11-03 ENCOUNTER — Ambulatory Visit: Payer: Self-pay | Admitting: Adult Health

## 2023-11-03 LAB — LIPID PANEL
Chol/HDL Ratio: 4.2 ratio (ref 0.0–4.4)
Cholesterol, Total: 159 mg/dL (ref 100–199)
HDL: 38 mg/dL — ABNORMAL LOW (ref >39)
LDL Chol Calc (NIH): 95 mg/dL (ref 0–99)
Triglycerides: 149 mg/dL (ref 0–149)
VLDL Cholesterol Cal: 26 mg/dL (ref 5–40)

## 2023-11-03 LAB — CYTOLOGY - PAP
Comment: NEGATIVE
Diagnosis: NEGATIVE
High risk HPV: NEGATIVE

## 2023-11-03 LAB — COMPREHENSIVE METABOLIC PANEL WITH GFR
ALT: 21 IU/L (ref 0–32)
AST: 16 IU/L (ref 0–40)
Albumin: 4.5 g/dL (ref 3.9–4.9)
Alkaline Phosphatase: 104 IU/L (ref 44–121)
BUN/Creatinine Ratio: 25 — ABNORMAL HIGH (ref 9–23)
BUN: 16 mg/dL (ref 6–24)
Bilirubin Total: 0.4 mg/dL (ref 0.0–1.2)
CO2: 22 mmol/L (ref 20–29)
Calcium: 9.2 mg/dL (ref 8.7–10.2)
Chloride: 103 mmol/L (ref 96–106)
Creatinine, Ser: 0.64 mg/dL (ref 0.57–1.00)
Globulin, Total: 2.2 g/dL (ref 1.5–4.5)
Glucose: 91 mg/dL (ref 70–99)
Potassium: 4.2 mmol/L (ref 3.5–5.2)
Sodium: 141 mmol/L (ref 134–144)
Total Protein: 6.7 g/dL (ref 6.0–8.5)
eGFR: 114 mL/min/{1.73_m2} (ref >59)

## 2023-11-03 LAB — TSH+FREE T4
Free T4: 1.33 ng/dL (ref 0.82–1.77)
TSH: 15.4 u[IU]/mL — ABNORMAL HIGH (ref 0.450–4.500)

## 2023-11-03 LAB — CBC
Hematocrit: 44.8 % (ref 34.0–46.6)
Hemoglobin: 14.5 g/dL (ref 11.1–15.9)
MCH: 30.1 pg (ref 26.6–33.0)
MCHC: 32.4 g/dL (ref 31.5–35.7)
MCV: 93 fL (ref 79–97)
Platelets: 279 10*3/uL (ref 150–450)
RBC: 4.81 x10E6/uL (ref 3.77–5.28)
RDW: 13.5 % (ref 11.7–15.4)
WBC: 11.4 10*3/uL — ABNORMAL HIGH (ref 3.4–10.8)

## 2023-11-03 MED ORDER — LEVOTHYROXINE SODIUM 137 MCG PO TABS: 137.0000 ug | ORAL_TABLET | Freq: Every day | ORAL | 4 refills | Status: DC

## 2023-11-09 ENCOUNTER — Ambulatory Visit (HOSPITAL_COMMUNITY)
Admission: RE | Admit: 2023-11-09 | Discharge: 2023-11-09 | Disposition: A | Discharge status: Home / Self Care | Source: Ambulatory Visit | Attending: Adult Health | Admitting: Adult Health

## 2023-11-09 DIAGNOSIS — E049 Nontoxic goiter, unspecified: Secondary | ICD-10-CM | POA: Diagnosis present

## 2023-11-09 DIAGNOSIS — R490 Dysphonia: Secondary | ICD-10-CM | POA: Insufficient documentation

## 2023-11-11 ENCOUNTER — Other Ambulatory Visit: Payer: Self-pay | Admitting: Adult Health

## 2023-11-11 DIAGNOSIS — R490 Dysphonia: Secondary | ICD-10-CM

## 2023-11-11 NOTE — Progress Notes (Signed)
 Refer to ENT

## 2024-01-03 ENCOUNTER — Other Ambulatory Visit

## 2024-01-03 DIAGNOSIS — E039 Hypothyroidism, unspecified: Secondary | ICD-10-CM

## 2024-01-12 ENCOUNTER — Encounter: Payer: Self-pay | Admitting: Adult Health

## 2024-01-12 ENCOUNTER — Ambulatory Visit: Admitting: Adult Health

## 2024-01-12 VITALS — BP 138/92 | HR 87 | Ht 63.0 in | Wt 217.5 lb

## 2024-01-12 DIAGNOSIS — N926 Irregular menstruation, unspecified: Secondary | ICD-10-CM | POA: Diagnosis not present

## 2024-01-12 DIAGNOSIS — R232 Flushing: Secondary | ICD-10-CM

## 2024-01-12 DIAGNOSIS — R1031 Right lower quadrant pain: Secondary | ICD-10-CM

## 2024-01-12 DIAGNOSIS — R030 Elevated blood-pressure reading, without diagnosis of hypertension: Secondary | ICD-10-CM

## 2024-01-12 DIAGNOSIS — Z3202 Encounter for pregnancy test, result negative: Secondary | ICD-10-CM

## 2024-01-12 DIAGNOSIS — R140 Abdominal distension (gaseous): Secondary | ICD-10-CM

## 2024-01-12 DIAGNOSIS — R5383 Other fatigue: Secondary | ICD-10-CM | POA: Diagnosis not present

## 2024-01-12 LAB — POCT URINE PREGNANCY: Preg Test, Ur: NEGATIVE

## 2024-01-12 NOTE — Progress Notes (Signed)
 Subjective:     Patient ID: Kimberly Morgan, female   DOB: Oct 01, 1980, 43 y.o.   MRN: [id number]  HPI Kimberly Morgan is a 43 year old white female, married, [id number] in complaining of feeling bloated since 12/29/23, has missed  period and cycles have varied some, has fatigue, stomach tight, worse at night and cramps right side some.      Component Value Date/Time   DIAGPAP  11/02/2023 1036    - Negative for intraepithelial lesion or malignancy (NILM)   DIAGPAP  10/28/2022 1145    - Negative for intraepithelial lesion or malignancy (NILM)   DIAGPAP  09/25/2021 1426    - Negative for intraepithelial lesion or malignancy (NILM)   HPVHIGH Negative 11/02/2023 1036   HPVHIGH Negative 10/28/2022 1145   HPVHIGH Negative 09/25/2021 1426   ADEQPAP  11/02/2023 1036    Satisfactory for evaluation; transformation zone component PRESENT.   ADEQPAP  10/28/2022 1145    Satisfactory for evaluation; transformation zone component ABSENT.   ADEQPAP  09/25/2021 1426    Satisfactory for evaluation; transformation zone component PRESENT.   PCP is Dr Orpha  Review of Systems + bloated since 12/29/23,  has missed  period and cycles have varied some,  has fatigue,  stomach tight, worse at night  Not gasy + cramps right side some.  Some hot flashes too  Had colon resection 02/23/23 for colon cancer  GB is out  Denies any problems with urination, bowel movements or sex   Reviewed past medical,surgical, social and family history. Reviewed medications and allergies.  Objective:   Physical Exam BP (!) 138/92 (BP Location: Left Arm, Patient Position: Sitting, Cuff Size: Normal)   Pulse 87   Ht 5' 3 (1.6 m)   Wt 217 lb 8 oz (98.7 kg)   LMP 12/02/2023   BMI 38.53 kg/m  UPT is negative    Skin warm and dry.  Lungs: clear to ausculation bilaterally. Cardiovascular: regular rate and rhythm.  Pelvic: external genitalia is normal in appearance no lesions, vagina: pink,urethra has no lesions or masses noted,  cervix:smooth and bulbous, uterus: normal size, shape and contour, non tender, no masses felt, adnexa: no masses or tenderness noted. Bladder is non tender and no masses felt.   Upstream - 01/12/24 1005       Pregnancy Intention Screening   Does the patient want to become pregnant in the next year? Ok Either Way    Does the patient's partner want to become pregnant in the next year? Ok Either Way    Would the patient like to discuss contraceptive options today? No      Contraception Wrap Up   Current Method No Method - Other Reason    End Method No Method - Other Reason    Contraception Counseling Provided No         Examination chaperoned by Clarita Salt LPN  Assessment:     1. Negative pregnancy test - POCT urine pregnancy  2. Missed period UPT is negative  - US  PELVIC COMPLETE WITH TRANSVAGINAL; Future - Follicle stimulating hormone  3. Bloated abdomen (Primary) Feels bloated since 12/29/23 US  scheduled for 01/17/24 at 12:30 pm at [facility] to assess uterus and ovaries  - US  PELVIC COMPLETE WITH TRANSVAGINAL; Future  4. Fatigue, unspecified type - CBC  5. Irregular periods/menstrual cycles  6. Elevated BP without diagnosis of hypertension Keep check on BP at home Limit salt and sugars  7. RLQ abdominal pain US  scheduled for 01/17/24 at 12:30 pm  at [facility] to assess uterus and ovaries  - US  PELVIC COMPLETE WITH TRANSVAGINAL; Future  8. Hot flashes - Follicle stimulating hormone     Plan:     Will talk when labs back and US  results back She is having labs for thyroid  checked too

## 2024-01-13 ENCOUNTER — Ambulatory Visit: Payer: Self-pay | Admitting: Adult Health

## 2024-01-13 DIAGNOSIS — E039 Hypothyroidism, unspecified: Secondary | ICD-10-CM

## 2024-01-13 LAB — CBC
Hematocrit: 47.5 % — ABNORMAL HIGH (ref 34.0–46.6)
Hemoglobin: 15.2 g/dL (ref 11.1–15.9)
MCH: 29.3 pg (ref 26.6–33.0)
MCHC: 32 g/dL (ref 31.5–35.7)
MCV: 92 fL (ref 79–97)
Platelets: 267 x10E3/uL (ref 150–450)
RBC: 5.18 x10E6/uL (ref 3.77–5.28)
RDW: 12.1 % (ref 11.7–15.4)
WBC: 10.8 x10E3/uL (ref 3.4–10.8)

## 2024-01-13 LAB — T4, FREE: Free T4: 1.43 ng/dL (ref 0.82–1.77)

## 2024-01-13 LAB — FOLLICLE STIMULATING HORMONE: FSH: 4.5 m[IU]/mL

## 2024-01-13 LAB — TSH: TSH: 4.91 u[IU]/mL — ABNORMAL HIGH (ref 0.450–4.500)

## 2024-01-13 MED ORDER — LEVOTHYROXINE SODIUM 150 MCG PO TABS: 150.0000 ug | ORAL_TABLET | Freq: Every day | ORAL | 3 refills | Status: AC

## 2024-01-17 ENCOUNTER — Ambulatory Visit (HOSPITAL_COMMUNITY)
Admission: RE | Admit: 2024-01-17 | Discharge: 2024-01-17 | Disposition: A | Discharge status: Home / Self Care | Source: Ambulatory Visit | Attending: Adult Health | Admitting: Adult Health

## 2024-01-17 DIAGNOSIS — R1031 Right lower quadrant pain: Secondary | ICD-10-CM | POA: Insufficient documentation

## 2024-01-17 DIAGNOSIS — R140 Abdominal distension (gaseous): Secondary | ICD-10-CM | POA: Diagnosis present

## 2024-01-17 DIAGNOSIS — N926 Irregular menstruation, unspecified: Secondary | ICD-10-CM | POA: Diagnosis present

## 2024-01-31 ENCOUNTER — Institutional Professional Consult (permissible substitution) (INDEPENDENT_AMBULATORY_CARE_PROVIDER_SITE_OTHER): Admitting: Otolaryngology

## 2024-03-06 ENCOUNTER — Institutional Professional Consult (permissible substitution) (INDEPENDENT_AMBULATORY_CARE_PROVIDER_SITE_OTHER): Admitting: Otolaryngology

## 2024-03-08 ENCOUNTER — Encounter (INDEPENDENT_AMBULATORY_CARE_PROVIDER_SITE_OTHER): Payer: Self-pay | Admitting: Gastroenterology

## 2024-03-14 ENCOUNTER — Telehealth (INDEPENDENT_AMBULATORY_CARE_PROVIDER_SITE_OTHER): Payer: Self-pay | Admitting: Otolaryngology

## 2024-03-14 NOTE — Telephone Encounter (Signed)
 Lvm to rs 03/27/24

## 2024-03-15 ENCOUNTER — Telehealth (INDEPENDENT_AMBULATORY_CARE_PROVIDER_SITE_OTHER): Payer: Self-pay | Admitting: Otolaryngology

## 2024-03-15 NOTE — Telephone Encounter (Signed)
 Left vm to r/s 03/27/24 appointment

## 2024-03-16 ENCOUNTER — Encounter (INDEPENDENT_AMBULATORY_CARE_PROVIDER_SITE_OTHER): Payer: Self-pay | Admitting: *Deleted

## 2024-03-17 ENCOUNTER — Encounter (INDEPENDENT_AMBULATORY_CARE_PROVIDER_SITE_OTHER): Payer: Self-pay

## 2024-03-27 ENCOUNTER — Institutional Professional Consult (permissible substitution) (INDEPENDENT_AMBULATORY_CARE_PROVIDER_SITE_OTHER): Admitting: Otolaryngology

## 2024-04-11 ENCOUNTER — Encounter (INDEPENDENT_AMBULATORY_CARE_PROVIDER_SITE_OTHER): Admitting: Gastroenterology

## 2024-04-12 ENCOUNTER — Institutional Professional Consult (permissible substitution) (INDEPENDENT_AMBULATORY_CARE_PROVIDER_SITE_OTHER)

## 2024-05-31 ENCOUNTER — Other Ambulatory Visit (HOSPITAL_COMMUNITY): Payer: Self-pay | Admitting: Adult Health

## 2024-05-31 DIAGNOSIS — Z1231 Encounter for screening mammogram for malignant neoplasm of breast: Secondary | ICD-10-CM

## 2024-06-07 ENCOUNTER — Ambulatory Visit (HOSPITAL_COMMUNITY)

## 2024-06-14 ENCOUNTER — Telehealth: Payer: Self-pay | Admitting: *Deleted

## 2024-06-14 NOTE — Telephone Encounter (Signed)
 Pt's pharmacy called, asking for a refill on pt's thyroid  med. Pt has about 8 days left of med. Pt needs to have thyroid  labs repeated so pharmacist will pass along the message to have labs drawn within the next 8 days if possible. JSY

## 2024-06-22 ENCOUNTER — Telehealth: Payer: Self-pay | Admitting: Adult Health

## 2024-06-22 NOTE — Telephone Encounter (Signed)
 [facility] pharmacy [phone]) called in to f/up on medication refill request for levothyroxine  150mcg.

## 2024-06-22 NOTE — Telephone Encounter (Signed)
 Pt is out of Levothyroxine ; ran out a couple days ago. JAG refilled med this time. Pt needs to have thyroid  labs drawn. Pharmacy will put note on prescription regarding need to have labs. JSY
